# Patient Record
Sex: Female | Born: 1945 | Race: Black or African American | Hispanic: No | Smoking: Never smoker
Health system: Southern US, Community
[De-identification: ages and names within clinical notes are randomized; demographics above are authoritative.]

## PROBLEM LIST (undated history)

## (undated) DIAGNOSIS — I10 Essential (primary) hypertension: Secondary | ICD-10-CM

## (undated) HISTORY — PX: BREAST EXCISIONAL BIOPSY: SUR124

---

## 2000-02-19 ENCOUNTER — Encounter: Admission: RE | Admit: 2000-02-19 | Discharge: 2000-02-19 | Payer: Self-pay | Admitting: *Deleted

## 2000-02-19 ENCOUNTER — Encounter: Payer: Self-pay | Admitting: *Deleted

## 2000-02-26 ENCOUNTER — Encounter: Payer: Self-pay | Admitting: *Deleted

## 2000-02-26 ENCOUNTER — Encounter: Admission: RE | Admit: 2000-02-26 | Discharge: 2000-02-26 | Payer: Self-pay | Admitting: *Deleted

## 2001-02-28 ENCOUNTER — Encounter: Admission: RE | Admit: 2001-02-28 | Discharge: 2001-02-28 | Payer: Self-pay | Admitting: Obstetrics and Gynecology

## 2001-02-28 ENCOUNTER — Encounter: Payer: Self-pay | Admitting: Obstetrics and Gynecology

## 2001-03-02 ENCOUNTER — Encounter: Payer: Self-pay | Admitting: Pulmonary Disease

## 2001-03-02 ENCOUNTER — Encounter: Admission: RE | Admit: 2001-03-02 | Discharge: 2001-03-02 | Payer: Self-pay | Admitting: Pulmonary Disease

## 2001-03-24 ENCOUNTER — Encounter: Payer: Self-pay | Admitting: Otolaryngology

## 2001-03-24 ENCOUNTER — Encounter: Admission: RE | Admit: 2001-03-24 | Discharge: 2001-03-24 | Payer: Self-pay | Admitting: Otolaryngology

## 2001-07-21 ENCOUNTER — Encounter: Admission: RE | Admit: 2001-07-21 | Discharge: 2001-07-21 | Payer: Self-pay | Admitting: Otolaryngology

## 2001-07-21 ENCOUNTER — Encounter: Payer: Self-pay | Admitting: Otolaryngology

## 2001-08-24 ENCOUNTER — Encounter: Payer: Self-pay | Admitting: Otolaryngology

## 2001-08-24 ENCOUNTER — Encounter: Admission: RE | Admit: 2001-08-24 | Discharge: 2001-08-24 | Payer: Self-pay | Admitting: Otolaryngology

## 2002-02-09 ENCOUNTER — Other Ambulatory Visit: Admission: RE | Admit: 2002-02-09 | Discharge: 2002-02-09 | Payer: Self-pay | Admitting: Obstetrics and Gynecology

## 2002-03-06 ENCOUNTER — Encounter: Admission: RE | Admit: 2002-03-06 | Discharge: 2002-03-06 | Payer: Self-pay | Admitting: Obstetrics and Gynecology

## 2002-03-06 ENCOUNTER — Encounter: Payer: Self-pay | Admitting: Obstetrics and Gynecology

## 2002-03-16 ENCOUNTER — Encounter: Payer: Self-pay | Admitting: Obstetrics and Gynecology

## 2002-03-16 ENCOUNTER — Encounter: Admission: RE | Admit: 2002-03-16 | Discharge: 2002-03-16 | Payer: Self-pay | Admitting: Obstetrics and Gynecology

## 2002-03-16 ENCOUNTER — Other Ambulatory Visit: Admission: RE | Admit: 2002-03-16 | Discharge: 2002-03-16 | Payer: Self-pay | Admitting: Obstetrics and Gynecology

## 2003-05-16 ENCOUNTER — Encounter: Admission: RE | Admit: 2003-05-16 | Discharge: 2003-05-16 | Payer: Self-pay | Admitting: Obstetrics and Gynecology

## 2003-05-27 ENCOUNTER — Other Ambulatory Visit: Admission: RE | Admit: 2003-05-27 | Discharge: 2003-05-27 | Payer: Self-pay | Admitting: Obstetrics and Gynecology

## 2004-02-26 ENCOUNTER — Encounter: Admission: RE | Admit: 2004-02-26 | Discharge: 2004-02-26 | Payer: Self-pay | Admitting: Pulmonary Disease

## 2004-05-19 ENCOUNTER — Encounter: Admission: RE | Admit: 2004-05-19 | Discharge: 2004-05-19 | Payer: Self-pay | Admitting: Pulmonary Disease

## 2004-06-23 ENCOUNTER — Other Ambulatory Visit: Admission: RE | Admit: 2004-06-23 | Discharge: 2004-06-23 | Payer: Self-pay | Admitting: Obstetrics and Gynecology

## 2005-08-17 ENCOUNTER — Encounter: Admission: RE | Admit: 2005-08-17 | Discharge: 2005-08-17 | Payer: Self-pay | Admitting: Pulmonary Disease

## 2007-01-10 ENCOUNTER — Encounter: Admission: RE | Admit: 2007-01-10 | Discharge: 2007-01-31 | Payer: Self-pay | Admitting: Pulmonary Disease

## 2007-01-11 ENCOUNTER — Ambulatory Visit (HOSPITAL_COMMUNITY): Admission: RE | Admit: 2007-01-11 | Discharge: 2007-01-11 | Payer: Self-pay | Admitting: Pulmonary Disease

## 2007-07-21 ENCOUNTER — Emergency Department (HOSPITAL_COMMUNITY): Admission: EM | Admit: 2007-07-21 | Discharge: 2007-07-21 | Payer: Self-pay | Admitting: Emergency Medicine

## 2007-08-08 ENCOUNTER — Ambulatory Visit (HOSPITAL_COMMUNITY): Admission: RE | Admit: 2007-08-08 | Discharge: 2007-08-08 | Payer: Self-pay | Admitting: Pulmonary Disease

## 2008-01-12 ENCOUNTER — Encounter: Admission: RE | Admit: 2008-01-12 | Discharge: 2008-01-12 | Payer: Self-pay | Admitting: Pulmonary Disease

## 2008-02-22 ENCOUNTER — Encounter: Admission: RE | Admit: 2008-02-22 | Discharge: 2008-02-22 | Payer: Self-pay | Admitting: Orthopaedic Surgery

## 2008-03-12 ENCOUNTER — Ambulatory Visit (HOSPITAL_BASED_OUTPATIENT_CLINIC_OR_DEPARTMENT_OTHER): Admission: RE | Admit: 2008-03-12 | Discharge: 2008-03-12 | Payer: Self-pay | Admitting: Orthopaedic Surgery

## 2008-07-26 ENCOUNTER — Encounter: Admission: RE | Admit: 2008-07-26 | Discharge: 2008-07-26 | Payer: Self-pay | Admitting: Pulmonary Disease

## 2009-06-17 ENCOUNTER — Encounter: Admission: RE | Admit: 2009-06-17 | Discharge: 2009-06-17 | Payer: Self-pay | Admitting: Pulmonary Disease

## 2010-06-18 ENCOUNTER — Other Ambulatory Visit: Payer: Self-pay | Admitting: Pulmonary Disease

## 2010-06-18 DIAGNOSIS — Z1239 Encounter for other screening for malignant neoplasm of breast: Secondary | ICD-10-CM

## 2010-06-23 ENCOUNTER — Ambulatory Visit
Admission: RE | Admit: 2010-06-23 | Discharge: 2010-06-23 | Disposition: A | Discharge status: Home / Self Care | Payer: BC Managed Care – PPO | Source: Ambulatory Visit | Attending: Pulmonary Disease | Admitting: Pulmonary Disease

## 2010-06-23 DIAGNOSIS — Z1239 Encounter for other screening for malignant neoplasm of breast: Secondary | ICD-10-CM

## 2010-09-29 NOTE — Op Note (Signed)
[name], [name] NO.:  [id number]   MEDICAL RECORD NO.:  [id number]          PATIENT TYPE:  AMB   LOCATION:  DSC                          FACILITY:  [facility]   PHYSICIAN:  Vanita Panda. Magnus Ivan, M.D.DATE OF BIRTH:  1945-09-08   DATE OF PROCEDURE:  03/12/2008  DATE OF DISCHARGE:                               OPERATIVE REPORT   PREOPERATIVE DIAGNOSIS:  Right knee internal derangement with medial  meniscal tear.   POSTOPERATIVE DIAGNOSIS:  Right knee complex posterior horn medial  meniscal tear.   PROCEDURE:  Right knee arthroscopy with debridement and partial medial  meniscectomy.   SURGEON:  Vanita Panda. Magnus Ivan, MD   ANESTHESIA:  General.   BLOOD LOSS:  Minimal.   TOURNIQUET TIME:  None.   INDICATIONS:  Briefly, [name] is a 65 year old female with right  knee pain.  This is mainly over the medial joint line.  An MRI was  obtained that showed a complex posterior horn medial meniscal tear.  She  is not having related locking and catching, but mainly just pain.  We  tried to treat this with anti-inflammatories and injection in her knee.  After worsening problems with inability, I recommended she undergo  arthroscopy with debridement of partial meniscectomy.  The risks and  benefits of this were explained to her at length and she agreed proceed  with surgery.   DESCRIPTION OF PROCEDURE:  After informed consent was obtained,  appropriate right leg was marked, brought to the operating room, and  placed supine on the operating table.  General anesthesia was obtained.  A nonsterile tourniquet was placed around her upper right thigh, but was  not utilized during the case.  Her leg was then prepped and draped with  DuraPrep and sterile drapes including a sterile stockinette with the bed  raised and the lateral leg post was utilized and the leg was flexed off  the side of the table.  A time-out was called to identify the correct  patient and correct  right knee.  I then made an anterior lateral  arthroscopy portal with a knife.  I placed arthroscope and there was a  large effusion encountered in the knee.  I went right away to the medial  compartment and you could see that there was a complex posterior horn  medial meniscal tear.  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  I then made an anterior medial portal at the  same level and the arthroscopic shaver was placed.  I then performed a  partial medial meniscectomy.  I then used up-cutting biters as well to  smooth the meniscus out to smooth the margin.  I then did a minimal  debridement and chondroplasty of the medial compartment.  Next, the  intercondylar area with assessed, the ACL and PCL were intact, then with  the knee in the figure four position, I went to the lateral compartment.  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  Finally with  the knee extended, I assessed the trochlear groove and the patella.  There was no areas of loose cartilage, but you could see there was  chondromalacia of the trochlear groove and the patella as well.  All  instrumentation was then removed before I allowed fluid to lavage the  knee.  I then drained effusion from the knee.  The portal site was  closed interrupted 4-0 nylon suture.  I then infiltrated the knee in  the portal sites of morphine, 0.25% plain Marcaine mixture.  Sterile  dressings were applied including Ace wrap and ice pack.  The patient was  awakened, extubated, and taken to recovery room in stable condition.  All final counts were correct and there were no complications noted.      Vanita Panda. Magnus Ivan,  M.D.  Electronically Signed     CYB/MEDQ  D:  03/12/2008  T:  03/13/2008  Job:  [id number]

## 2011-02-16 LAB — POCT HEMOGLOBIN-HEMACUE: Hemoglobin: 13.6

## 2011-02-16 LAB — BASIC METABOLIC PANEL
CO2: 30
Calcium: 8.6
Chloride: 103
Creatinine, Ser: 0.81
GFR calc non Af Amer: >60
Sodium: 138

## 2011-07-16 ENCOUNTER — Other Ambulatory Visit: Payer: Self-pay | Admitting: Pulmonary Disease

## 2011-07-16 DIAGNOSIS — Z1231 Encounter for screening mammogram for malignant neoplasm of breast: Secondary | ICD-10-CM

## 2011-07-19 ENCOUNTER — Other Ambulatory Visit (HOSPITAL_COMMUNITY): Payer: Self-pay | Admitting: Orthopaedic Surgery

## 2011-08-13 ENCOUNTER — Ambulatory Visit
Admission: RE | Admit: 2011-08-13 | Discharge: 2011-08-13 | Disposition: A | Discharge status: Home / Self Care | Payer: Medicare Other | Source: Ambulatory Visit | Attending: Pulmonary Disease | Admitting: Pulmonary Disease

## 2011-08-13 DIAGNOSIS — Z1231 Encounter for screening mammogram for malignant neoplasm of breast: Secondary | ICD-10-CM

## 2011-10-08 ENCOUNTER — Ambulatory Visit (HOSPITAL_COMMUNITY): Admission: RE | Admit: 2011-10-08 | Payer: Medicare Other | Source: Ambulatory Visit | Admitting: Orthopaedic Surgery

## 2011-10-08 ENCOUNTER — Encounter (HOSPITAL_COMMUNITY): Admission: RE | Payer: Self-pay | Source: Ambulatory Visit

## 2011-10-08 SURGERY — ARTHROPLASTY, KNEE, TOTAL, USING IMAGELESS COMPUTER-ASSISTED NAVIGATION
Anesthesia: General | Site: Knee | Laterality: Left

## 2012-04-25 DIAGNOSIS — M179 Osteoarthritis of knee, unspecified: Secondary | ICD-10-CM | POA: Insufficient documentation

## 2012-05-31 DIAGNOSIS — I10 Essential (primary) hypertension: Secondary | ICD-10-CM

## 2012-05-31 DIAGNOSIS — K219 Gastro-esophageal reflux disease without esophagitis: Secondary | ICD-10-CM

## 2012-06-07 HISTORY — PX: MEDIAL PARTIAL KNEE REPLACEMENT: SHX5965

## 2012-06-12 ENCOUNTER — Ambulatory Visit: Payer: Medicare Other | Attending: Orthopedic Surgery

## 2012-06-12 DIAGNOSIS — IMO0001 Reserved for inherently not codable concepts without codable children: Secondary | ICD-10-CM | POA: Insufficient documentation

## 2012-06-12 DIAGNOSIS — R262 Difficulty in walking, not elsewhere classified: Secondary | ICD-10-CM | POA: Insufficient documentation

## 2012-06-12 DIAGNOSIS — M2560 Stiffness of unspecified joint, not elsewhere classified: Secondary | ICD-10-CM | POA: Insufficient documentation

## 2012-06-12 DIAGNOSIS — M2550 Pain in unspecified joint: Secondary | ICD-10-CM | POA: Insufficient documentation

## 2012-06-12 DIAGNOSIS — M6281 Muscle weakness (generalized): Secondary | ICD-10-CM | POA: Insufficient documentation

## 2012-06-19 ENCOUNTER — Ambulatory Visit: Payer: Medicare Other | Attending: Orthopedic Surgery

## 2012-06-19 DIAGNOSIS — M6281 Muscle weakness (generalized): Secondary | ICD-10-CM | POA: Insufficient documentation

## 2012-06-19 DIAGNOSIS — IMO0001 Reserved for inherently not codable concepts without codable children: Secondary | ICD-10-CM | POA: Insufficient documentation

## 2012-06-19 DIAGNOSIS — M2560 Stiffness of unspecified joint, not elsewhere classified: Secondary | ICD-10-CM | POA: Insufficient documentation

## 2012-06-19 DIAGNOSIS — R262 Difficulty in walking, not elsewhere classified: Secondary | ICD-10-CM | POA: Insufficient documentation

## 2012-06-19 DIAGNOSIS — M2550 Pain in unspecified joint: Secondary | ICD-10-CM | POA: Insufficient documentation

## 2012-06-22 ENCOUNTER — Ambulatory Visit: Payer: Medicare Other

## 2012-06-26 ENCOUNTER — Ambulatory Visit: Payer: Medicare Other

## 2012-06-29 ENCOUNTER — Ambulatory Visit: Payer: Medicare Other

## 2012-07-11 ENCOUNTER — Ambulatory Visit: Payer: Medicare Other

## 2012-07-18 ENCOUNTER — Ambulatory Visit: Payer: Medicare Other

## 2012-07-20 ENCOUNTER — Ambulatory Visit: Payer: Medicare Other | Attending: Orthopedic Surgery | Admitting: Physical Therapy

## 2012-07-20 DIAGNOSIS — R262 Difficulty in walking, not elsewhere classified: Secondary | ICD-10-CM | POA: Insufficient documentation

## 2012-07-20 DIAGNOSIS — M2550 Pain in unspecified joint: Secondary | ICD-10-CM | POA: Insufficient documentation

## 2012-07-20 DIAGNOSIS — M6281 Muscle weakness (generalized): Secondary | ICD-10-CM | POA: Insufficient documentation

## 2012-07-20 DIAGNOSIS — M2560 Stiffness of unspecified joint, not elsewhere classified: Secondary | ICD-10-CM | POA: Insufficient documentation

## 2012-07-20 DIAGNOSIS — IMO0001 Reserved for inherently not codable concepts without codable children: Secondary | ICD-10-CM | POA: Insufficient documentation

## 2012-07-21 ENCOUNTER — Ambulatory Visit: Payer: Medicare Other

## 2012-07-26 ENCOUNTER — Ambulatory Visit: Payer: Medicare Other

## 2012-07-28 ENCOUNTER — Ambulatory Visit: Payer: Medicare Other

## 2012-11-28 DIAGNOSIS — Z96651 Presence of right artificial knee joint: Secondary | ICD-10-CM | POA: Insufficient documentation

## 2012-11-28 DIAGNOSIS — Z96652 Presence of left artificial knee joint: Secondary | ICD-10-CM | POA: Insufficient documentation

## 2013-02-28 ENCOUNTER — Ambulatory Visit (INDEPENDENT_AMBULATORY_CARE_PROVIDER_SITE_OTHER): Payer: Medicare Other | Admitting: Podiatry

## 2013-02-28 ENCOUNTER — Encounter: Payer: Self-pay | Admitting: Podiatry

## 2013-02-28 VITALS — BP 118/70 | HR 71 | Ht 64.0 in | Wt 195.0 lb

## 2013-02-28 DIAGNOSIS — B351 Tinea unguium: Secondary | ICD-10-CM | POA: Insufficient documentation

## 2013-02-28 DIAGNOSIS — M79673 Pain in unspecified foot: Secondary | ICD-10-CM | POA: Insufficient documentation

## 2013-02-28 DIAGNOSIS — M79609 Pain in unspecified limb: Secondary | ICD-10-CM

## 2013-02-28 NOTE — Patient Instructions (Signed)
Seen for painful nails. All nails debrided.

## 2013-02-28 NOTE — Progress Notes (Signed)
67 year old presents requesting both great toe nails trimmed. They are very thick and hurts to wear any type of closed in shoes.  Objective: Neurovascular status are within normal. No other dermatologic issues other than the nail. Thick deformed nail 1 bil. Left foot bunion.  Assessment: Painful nail both great toes.  Plan: All nails debrided.

## 2013-03-05 ENCOUNTER — Other Ambulatory Visit: Payer: Self-pay | Admitting: Pulmonary Disease

## 2013-03-05 DIAGNOSIS — Z1231 Encounter for screening mammogram for malignant neoplasm of breast: Secondary | ICD-10-CM

## 2013-03-24 ENCOUNTER — Emergency Department (INDEPENDENT_AMBULATORY_CARE_PROVIDER_SITE_OTHER)
Admission: EM | Admit: 2013-03-24 | Discharge: 2013-03-24 | Disposition: A | Discharge status: Home / Self Care | Payer: PRIVATE HEALTH INSURANCE | Source: Home / Self Care

## 2013-03-24 ENCOUNTER — Encounter (HOSPITAL_COMMUNITY): Payer: Self-pay | Admitting: Emergency Medicine

## 2013-03-24 DIAGNOSIS — R252 Cramp and spasm: Secondary | ICD-10-CM

## 2013-03-24 DIAGNOSIS — R259 Unspecified abnormal involuntary movements: Secondary | ICD-10-CM

## 2013-03-24 DIAGNOSIS — M2550 Pain in unspecified joint: Secondary | ICD-10-CM

## 2013-03-24 HISTORY — DX: Essential (primary) hypertension: I10

## 2013-03-24 MED ORDER — IBUPROFEN 600 MG PO TABS: 600.0000 mg | ORAL_TABLET | Freq: Four times a day (QID) | ORAL | Status: DC | PRN

## 2013-03-24 MED ORDER — CYCLOBENZAPRINE HCL 5 MG PO TABS: 5.0000 mg | ORAL_TABLET | Freq: Three times a day (TID) | ORAL | Status: DC | PRN

## 2013-03-24 MED ORDER — TRAMADOL HCL 50 MG PO TABS: 50.0000 mg | ORAL_TABLET | Freq: Four times a day (QID) | ORAL | Status: DC | PRN
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
## 2013-03-25 NOTE — ED Provider Notes (Signed)
Medical screening examination/treatment/procedure(s) were performed by resident physician or non-physician practitioner and as supervising physician I was immediately available for consultation/collaboration.   Barkley Bruns MD.   Linna Hoff, MD 03/25/13 [phone]

## 2013-04-19 ENCOUNTER — Ambulatory Visit
Admission: RE | Admit: 2013-04-19 | Discharge: 2013-04-19 | Disposition: A | Discharge status: Home / Self Care | Payer: Medicare Other | Source: Ambulatory Visit | Attending: Pulmonary Disease | Admitting: Pulmonary Disease

## 2013-04-19 DIAGNOSIS — Z1231 Encounter for screening mammogram for malignant neoplasm of breast: Secondary | ICD-10-CM

## 2013-11-20 ENCOUNTER — Encounter: Payer: Self-pay | Admitting: Podiatry

## 2013-11-20 ENCOUNTER — Ambulatory Visit (INDEPENDENT_AMBULATORY_CARE_PROVIDER_SITE_OTHER): Payer: Medicare Other | Admitting: Podiatry

## 2013-11-20 VITALS — BP 116/69 | HR 92 | Ht 64.0 in | Wt 190.0 lb

## 2013-11-20 DIAGNOSIS — M79672 Pain in left foot: Secondary | ICD-10-CM

## 2013-11-20 DIAGNOSIS — L608 Other nail disorders: Secondary | ICD-10-CM

## 2013-11-20 DIAGNOSIS — B351 Tinea unguium: Secondary | ICD-10-CM

## 2013-11-20 DIAGNOSIS — L609 Nail disorder, unspecified: Secondary | ICD-10-CM

## 2013-11-20 DIAGNOSIS — M79609 Pain in unspecified limb: Secondary | ICD-10-CM

## 2013-11-20 NOTE — Progress Notes (Signed)
Subjective: 68 year old presents requesting both great toe nails trimmed. They got thick and deformed since nail surgery was done.  Nails are very thick and hurts to wear any type of closed in shoes.   Objective:  Neurovascular status are within normal.  No other dermatologic issues other than the nail.  Thick deformed nail 1 bil.  Left foot bunion.   Assessment: Painful nail both great toes.   Plan: Both big toe nails debrided and grinded. Advised to use Salsun blue to wash and scrub.  Return in 3 months.

## 2013-11-20 NOTE — Patient Instructions (Signed)
Seen for hypertrophic nails. All nails debrided. Return in 3 months or as needed.  

## 2014-02-20 ENCOUNTER — Ambulatory Visit (INDEPENDENT_AMBULATORY_CARE_PROVIDER_SITE_OTHER): Payer: Medicare Other | Admitting: Podiatry

## 2014-02-20 ENCOUNTER — Encounter: Payer: Self-pay | Admitting: Podiatry

## 2014-02-20 VITALS — BP 120/72 | HR 85 | Ht 64.0 in | Wt 194.0 lb

## 2014-02-20 DIAGNOSIS — M79673 Pain in unspecified foot: Secondary | ICD-10-CM | POA: Diagnosis not present

## 2014-02-20 DIAGNOSIS — B351 Tinea unguium: Secondary | ICD-10-CM

## 2014-02-20 DIAGNOSIS — L608 Other nail disorders: Secondary | ICD-10-CM

## 2014-02-20 NOTE — Patient Instructions (Signed)
Seen for hypertrophic nails. All nails debrided. Return in 3 months or as needed.  

## 2014-02-20 NOTE — Progress Notes (Signed)
Subjective:  68 year old presents requesting both great toe nails trimmed. They are thick and deformed.  She was not able to find Salsun blue.  Objective:  Neurovascular status are within normal.  No other dermatologic issues other than the nail.  Thick deformed nail 1 bil.  Left foot bunion asymptomatic.  Assessment: Painful deformed nail both great toes.  Mycotic nails.  Plan: Both big toe nails debrided and grinded.  Advised to use any dandruff shampoo to scrub toe nails.  Return in 3 months.

## 2014-05-24 ENCOUNTER — Ambulatory Visit: Payer: Medicare Other | Admitting: Podiatry

## 2014-06-18 ENCOUNTER — Ambulatory Visit: Payer: PRIVATE HEALTH INSURANCE | Admitting: Podiatry

## 2014-08-21 ENCOUNTER — Encounter: Payer: Self-pay | Admitting: Podiatry

## 2014-08-21 ENCOUNTER — Ambulatory Visit (INDEPENDENT_AMBULATORY_CARE_PROVIDER_SITE_OTHER): Payer: PPO | Admitting: Podiatry

## 2014-08-21 VITALS — BP 136/72 | HR 87 | Ht 64.0 in | Wt 193.0 lb

## 2014-08-21 DIAGNOSIS — L608 Other nail disorders: Secondary | ICD-10-CM

## 2014-08-21 DIAGNOSIS — M21612 Bunion of left foot: Secondary | ICD-10-CM

## 2014-08-21 DIAGNOSIS — M2012 Hallux valgus (acquired), left foot: Secondary | ICD-10-CM | POA: Diagnosis not present

## 2014-08-21 DIAGNOSIS — M21619 Bunion of unspecified foot: Secondary | ICD-10-CM | POA: Insufficient documentation

## 2014-08-21 NOTE — Patient Instructions (Signed)
Seen for thick great toe nails. Noted of enlarged bunion on left. May benefit from bunionectomy on left. Both great toe nails grinded. Return as needed.

## 2014-08-21 NOTE — Progress Notes (Signed)
Subjective:  69 year old presents with concern on left big to callus and request to have both great toe nails trimmed. They are thick and deformed.   Objective:  Neurovascular status are within normal.  No other dermatologic issues other than the nail.  Thick deformed nail 1 bil.  Pinch callus left hallux.  Left foot bunion asymptomatic.  Assessment: Painful deformed nail both great toes.  Mycotic nails.  Plan: Both big toe nails grinded.  Reviewed surgical option on left foot bunion. Return in 3-4 months or as needed.

## 2014-12-18 ENCOUNTER — Encounter: Payer: Self-pay | Admitting: Podiatry

## 2014-12-18 ENCOUNTER — Ambulatory Visit (INDEPENDENT_AMBULATORY_CARE_PROVIDER_SITE_OTHER): Payer: PPO | Admitting: Podiatry

## 2014-12-18 VITALS — BP 123/69 | HR 72

## 2014-12-18 DIAGNOSIS — B351 Tinea unguium: Secondary | ICD-10-CM | POA: Diagnosis not present

## 2014-12-18 DIAGNOSIS — M2012 Hallux valgus (acquired), left foot: Secondary | ICD-10-CM

## 2014-12-18 DIAGNOSIS — M79673 Pain in unspecified foot: Secondary | ICD-10-CM

## 2014-12-18 DIAGNOSIS — M21612 Bunion of left foot: Secondary | ICD-10-CM

## 2014-12-18 NOTE — Patient Instructions (Signed)
Debrided both great toe nails and filed. Return in 3 months or as needed.

## 2014-12-18 NOTE — Progress Notes (Signed)
Subjective:  69 year old female presents requesting both great toe nails trimmed. They are thick and deformed.   Objective:  Neurovascular status are within normal.  No other dermatologic issues other than the nail.  Thick deformed nail 1 bil.  Pinch callus left hallux.  Left foot bunion asymptomatic.  Assessment: Painful deformed nail both great toes.  Mycotic nails.  Plan: Both big toe nails grinded.  Reviewed surgical option on left foot bunion. Return in 3-4 months or as needed

## 2015-03-20 ENCOUNTER — Ambulatory Visit (INDEPENDENT_AMBULATORY_CARE_PROVIDER_SITE_OTHER): Payer: PPO | Admitting: Podiatry

## 2015-03-20 ENCOUNTER — Encounter: Payer: Self-pay | Admitting: Podiatry

## 2015-03-20 VITALS — BP 132/75 | HR 85

## 2015-03-20 DIAGNOSIS — B351 Tinea unguium: Secondary | ICD-10-CM

## 2015-03-20 DIAGNOSIS — M79673 Pain in unspecified foot: Secondary | ICD-10-CM | POA: Diagnosis not present

## 2015-03-20 DIAGNOSIS — L608 Other nail disorders: Secondary | ICD-10-CM

## 2015-03-20 NOTE — Progress Notes (Signed)
Subjective:  69 year old female presents requesting both great toe nails grinded rather than trimmed. Patient wants to try to grow tham over the distal hump of the toe.   Objective:  Neurovascular status are within normal.  No other dermatologic issues other than the nail.  Thick deformed nail 1 bil.  Pinch callus left hallux.  Left foot bunion asymptomatic.  Assessment: Painful deformed nail both great toes.  Mycotic nails both halluces.  Plan: Both thick nails grinded down to thin on both great toe.  Reviewed possible more fungal infection under the nail plate when they were not debrided short. Patient still did not want them to cut out.  Return in 3-4 months or as needed

## 2015-03-20 NOTE — Patient Instructions (Signed)
Grinded both great toe nail. Return in 3 months or as needed.

## 2015-03-21 ENCOUNTER — Ambulatory Visit: Payer: PRIVATE HEALTH INSURANCE | Admitting: Podiatry

## 2015-05-18 ENCOUNTER — Encounter (HOSPITAL_COMMUNITY): Payer: Self-pay | Admitting: Emergency Medicine

## 2015-05-18 ENCOUNTER — Emergency Department (INDEPENDENT_AMBULATORY_CARE_PROVIDER_SITE_OTHER)
Admission: EM | Admit: 2015-05-18 | Discharge: 2015-05-18 | Disposition: A | Discharge status: Home / Self Care | Payer: Medicare Other | Source: Home / Self Care | Attending: Internal Medicine | Admitting: Internal Medicine

## 2015-05-18 DIAGNOSIS — J019 Acute sinusitis, unspecified: Secondary | ICD-10-CM | POA: Diagnosis not present

## 2015-05-18 LAB — POCT RAPID STREP A: STREPTOCOCCUS, GROUP A SCREEN (DIRECT): NEGATIVE

## 2015-05-18 MED ORDER — PREDNISONE 50 MG PO TABS: 50.0000 mg | ORAL_TABLET | Freq: Every day | ORAL | Status: AC

## 2015-05-18 MED ORDER — AZITHROMYCIN 250 MG PO TABS: ORAL_TABLET | ORAL | Status: DC

## 2015-05-18 NOTE — Discharge Instructions (Signed)
Prescriptions for [facility] (antibiotic) [facility] [address]. Recheck or followup with Dr Petra KubaKilpatrick in a few days if not starting to improve, for new fever >100.5, or increasing phlegm.

## 2015-05-18 NOTE — ED Notes (Signed)
Patient reports onset of symptoms last Monday 12/26.  Reports night sweats, sore throat, ears hurting, eyes hurting.  Reports nosebleed last night.  Denies nausea, denies vomiting.  Patient reports poor appetite. Patient reports having a near syncopal episode on Tuesday or Wednesday.  Denies any injury just overwhelming weakness

## 2015-05-18 NOTE — ED Provider Notes (Addendum)
CSN: [id number]     Arrival date & time 05/18/15  1304 History   First MD Initiated Contact with Patient 05/18/15 1427     Chief Complaint  Patient presents with  . URI   HPI  70 year old lady who presents with a one-week history of headache, sinus congestion, runny nose, sinus drainage. Sore throat. Productive cough. Nausea but no vomiting. No appetite. No diarrhea. Achiness, which she predominantly locates in the head. Has felt unsteady on her feet. Had a flu shot this season.  Past Medical History  Diagnosis Date  . Hypertension    Past Surgical History  Procedure Laterality Date  . 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 Smoking status: Never Smoker   . Smokeless tobacco: Never Used  . Alcohol Use: No    Review of Systems  All other systems reviewed and are negative.   Allergies  Codeine and Tetracyclines & related  Home Medications   Prior to Admission medications   Medication Sig Start Date End Date Taking? Authorizing Provider         cyclobenzaprine (FLEXERIL) 5 MG tablet Take 1 tablet (5 mg total) by mouth 3 (three) times daily as needed for muscle spasms. 03/24/13   Ozella Rocks, MD  Diethylpropion HCl CR 75 MG TB24 Take 1 tablet by mouth daily. 07/17/14   Historical Provider, MD  ibuprofen (ADVIL,MOTRIN) 600 MG tablet Take 1 tablet (600 mg total) by mouth every 6 (six) hours as needed. 03/24/13   Ozella Rocks, MD  losartan-hydrochlorothiazide (HYZAAR) 50-12.5 MG per tablet Take 1 tablet by mouth daily.    Historical Provider, MD  omeprazole (PRILOSEC) 40 MG capsule  08/01/14   Historical Provider, MD         ranitidine (ZANTAC) 150 MG capsule Take 150 mg by mouth 2 (two) times daily.    Historical Provider, MD  rosuvastatin (CRESTOR) 5 MG tablet Take 5 mg by mouth daily.    Historical Provider, MD  traMADol (ULTRAM) 50 MG tablet Take 1 tablet (50 mg total) by mouth every 6 (six) hours as needed. 03/24/13    Ozella Rocks, MD    BP 130/84 mmHg  Pulse 115  Temp(Src) 99.9 F (37.7 C) (Oral)  Resp 22  SpO2 96% Orthostatic VS for the past 24 hrs:  BP- Lying Pulse- Lying BP- Sitting Pulse- Sitting BP- Standing at 0 minutes Pulse- Standing at 0 minutes  05/18/15 1445 170/75 mmHg 103 116/77 mmHg 101 121/82 mmHg 109    Physical Exam  Constitutional: She is oriented to person, place, and time.  Alert, nicely groomed Sitting in the bedside chair, looks ill but not toxic  HENT:  Head: Atraumatic.  Bilateral TMs are moderately dull, red tinged Marked nasal congestion with crusting, mucopurulent material present bilaterally, left nares with crusted blood Mouth is a little bit dry, throat is a little bit red  Eyes:  Conjugate gaze, no eye redness/drainage  Neck: Neck supple.  Cardiovascular: Regular rhythm.   Heart rate 110s on exam  Pulmonary/Chest: No respiratory distress. She has no wheezes. She has no rales.  Coarse but symmetric breath sounds throughout  Abdominal: She exhibits no distension.  Musculoskeletal: Normal range of motion. She exhibits no edema.  No leg swelling  Neurological: She is alert and oriented to person, place, and time.  Skin: Skin is warm and dry.  No cyanosis  Nursing note and vitals reviewed.   ED Course  Procedures (including critical care time)  Labs Review  Results for orders placed or performed during the hospital encounter of 05/18/15  POCT rapid strep A [facility]  Result Value Ref Range   Streptococcus, Group A Screen (Direct) NEGATIVE NEGATIVE      MDM   1. Acute sinusitis, unspecified    Discharge Medication List as of 05/18/2015  3:06 PM    START taking these medications   Details  azithromycin (ZITHROMAX) 250 MG tablet 2 tabs today then 1 tablet daily by mouth for 4 more days, until gone., Normal    predniSONE (DELTASONE) 50 MG tablet Take 1 tablet (50 mg total) by mouth daily with breakfast., Starting 05/18/2015, Until Wed  05/21/15, Normal       Push fluids. Recheck or FU pcp/George Kilpatrick for new fever >100.5, increasing phlegm, or if not starting to improve in a few days. Anticipate gradual improvement in cough/congestion over the next 2-3 weeks.    Eustace MooreLaura W Dorlene Footman, MD 05/19/15 1103  Eustace MooreLaura W Calianna Kim, MD 05/19/15 [phone]

## 2015-05-20 LAB — CULTURE, GROUP A STREP: Strep A Culture: NEGATIVE

## 2015-06-03 ENCOUNTER — Encounter: Payer: Self-pay | Admitting: Obstetrics

## 2015-06-03 ENCOUNTER — Ambulatory Visit (INDEPENDENT_AMBULATORY_CARE_PROVIDER_SITE_OTHER): Payer: Medicare Other | Admitting: Obstetrics

## 2015-06-03 VITALS — BP 106/72 | HR 83 | Wt 215.0 lb

## 2015-06-03 DIAGNOSIS — R399 Unspecified symptoms and signs involving the genitourinary system: Secondary | ICD-10-CM | POA: Diagnosis not present

## 2015-06-03 DIAGNOSIS — R1032 Left lower quadrant pain: Secondary | ICD-10-CM

## 2015-06-03 LAB — POCT URINALYSIS DIPSTICK
BILIRUBIN UA: NEGATIVE
Glucose, UA: NEGATIVE
Ketones, UA: NEGATIVE
Nitrite, UA: NEGATIVE
SPEC GRAV UA: 1.025
UROBILINOGEN UA: NEGATIVE
pH, UA: 5

## 2015-06-03 NOTE — Addendum Note (Signed)
Addended by: Marya Landry D on: 06/03/2015 10:25 AM   Modules accepted: Orders

## 2015-06-03 NOTE — Progress Notes (Signed)
Patient ID: [name], female   DOB: June 11, 1945, 70 y.o.   MRN: [id number]  Chief Complaint  Patient presents with  . Pelvic Pain    lower left side pain, resolving, some vaginal pain    HPI [name] is a 70 y.o. female.  Abdominal pain over past several weeks, resolving.  Occasional constipation.  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  Has also had some vulva pain. HPI  Past Medical History  Diagnosis Date  . Hypertension     Past Surgical History  Procedure Laterality Date  . 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 No pertinent family history.  Social History Social History  Substance Use Topics  . Smoking status: Never Smoker   . Smokeless tobacco: Never Used  . Alcohol Use: No    Allergies  Allergen Reactions  . Codeine Nausea And Vomiting  . Tetracyclines & Related Nausea And Vomiting  . Vibramycin [Doxycycline Calcium] Nausea And Vomiting    Current Outpatient Prescriptions  Medication Sig Dispense Refill  . aspirin EC 81 MG tablet Take 81 mg by mouth daily.    Marland Kitchen losartan-hydrochlorothiazide (HYZAAR) 50-12.5 MG per tablet Take 1 tablet by mouth daily.    Marland Kitchen omeprazole (PRILOSEC) 40 MG capsule   0  . rosuvastatin (CRESTOR) 5 MG tablet Take 5 mg by mouth daily.    Marland Kitchen azithromycin (ZITHROMAX) 250 MG tablet 2 tabs today then 1 tablet daily by mouth for 4 more days, until gone. (Patient not taking: Reported on 06/03/2015) 6 each 0  . cyclobenzaprine (FLEXERIL) 5 MG tablet Take 1 tablet (5 mg total) by mouth 3 (three) times daily as needed for muscle spasms. (Patient not taking: Reported on 06/03/2015) 30 tablet 0  . Diethylpropion HCl CR 75 MG TB24 Take 1 tablet by mouth daily. Reported on 06/03/2015  0  . ibuprofen (ADVIL,MOTRIN) 600 MG tablet Take 1 tablet (600 mg total) by mouth every 6 (six) hours as needed. (Patient not taking:  Reported on 06/03/2015) 30 tablet 0  . ranitidine (ZANTAC) 150 MG capsule Take 150 mg by mouth 2 (two) times daily. Reported on 06/03/2015    . traMADol (ULTRAM) 50 MG tablet Take 1 tablet (50 mg total) by mouth every 6 (six) hours as needed. (Patient not taking: Reported on 06/03/2015) 30 tablet 0   No current facility-administered medications for this visit.    Review of Systems Review of Systems Constitutional: negative for fatigue and weight loss Respiratory: negative for cough and wheezing Cardiovascular: negative for chest pain, fatigue and palpitations Gastrointestinal: positive for abdominal pain and negative for change in bowel habits Genitourinary:negative Integument/breast: negative for nipple discharge Musculoskeletal:negative for myalgias Neurological: negative for gait problems and tremors Behavioral/Psych: negative for abusive relationship, depression Endocrine: negative for temperature intolerance     Blood pressure 106/72, pulse 83, weight 215 lb (97.523 kg).  Physical Exam Physical Exam General:   alert  Skin:   no rash or abnormalities  Lungs:   clear to auscultation bilaterally  Heart:   regular rate and rhythm, S1, S2 normal, no murmur, click, rub or gallop  Breasts:   normal without suspicious masses, skin or nipple changes or axillary nodes  Abdomen:  normal findings: no organomegaly, soft, non-tender and no hernia  Pelvis:  External genitalia: normal general appearance Urinary system: urethral meatus normal and bladder without fullness, nontender Vaginal:  normal without tenderness, induration or masses Cervix: absent Adnexa: not felt Uterus: absent      Data Reviewed Urinalysis  Assessment     Abdominal / Pelvic pain.  Doubt gyn etiology.  Resolving spontaneously.     Plan    Will follow conservatively. F/U in 4 weeks.   Orders Placed This Encounter  Procedures  . Urine culture   Meds ordered this encounter  Medications  . aspirin EC 81  MG tablet    Sig: Take 81 mg by mouth daily.

## 2015-06-05 LAB — URINE CULTURE
COLONY COUNT: NO GROWTH
ORGANISM ID, BACTERIA: NO GROWTH

## 2015-06-19 ENCOUNTER — Encounter: Payer: Self-pay | Admitting: Podiatry

## 2015-06-19 ENCOUNTER — Ambulatory Visit (INDEPENDENT_AMBULATORY_CARE_PROVIDER_SITE_OTHER): Payer: Medicare Other | Admitting: Podiatry

## 2015-06-19 VITALS — BP 142/69 | HR 82

## 2015-06-19 DIAGNOSIS — M79673 Pain in unspecified foot: Secondary | ICD-10-CM

## 2015-06-19 DIAGNOSIS — L608 Other nail disorders: Secondary | ICD-10-CM

## 2015-06-19 DIAGNOSIS — B351 Tinea unguium: Secondary | ICD-10-CM

## 2015-06-19 NOTE — Patient Instructions (Signed)
Seen for hypertrophic nails. Both great toe nails grinded. Return in 3 months or as needed.

## 2015-06-19 NOTE — Progress Notes (Signed)
Subjective:  70 year old female presents requesting both great toe nails grinded rather than trimmed.  Patient wants to try to grow tham over the distal hump of the toe.   Objective:  Neurovascular status are within normal.  No other dermatologic issues other than the nail.  Thick deformed nail 1 bil.  Pinch callus left hallux.  Left foot bunion asymptomatic.  Assessment: Painful deformed nail both great toes.  Mycotic nails both halluces.  Plan: Both thick nails grinded down to thin on both great toe.

## 2015-07-01 ENCOUNTER — Telehealth: Payer: Self-pay

## 2015-07-01 ENCOUNTER — Ambulatory Visit (INDEPENDENT_AMBULATORY_CARE_PROVIDER_SITE_OTHER): Payer: PRIVATE HEALTH INSURANCE | Admitting: Obstetrics

## 2015-07-01 ENCOUNTER — Encounter: Payer: Self-pay | Admitting: Obstetrics

## 2015-07-01 VITALS — BP 125/78 | HR 95 | Temp 98.4°F | Wt 218.0 lb

## 2015-07-01 DIAGNOSIS — Z1212 Encounter for screening for malignant neoplasm of rectum: Secondary | ICD-10-CM

## 2015-07-01 DIAGNOSIS — R1032 Left lower quadrant pain: Secondary | ICD-10-CM

## 2015-07-01 DIAGNOSIS — Z1239 Encounter for other screening for malignant neoplasm of breast: Secondary | ICD-10-CM | POA: Diagnosis not present

## 2015-07-01 DIAGNOSIS — R399 Unspecified symptoms and signs involving the genitourinary system: Secondary | ICD-10-CM

## 2015-07-01 DIAGNOSIS — Z1211 Encounter for screening for malignant neoplasm of colon: Secondary | ICD-10-CM

## 2015-07-01 NOTE — Telephone Encounter (Signed)
LEFT MESSAGE FOR PATIENT TO CALL REGARDING [facility] AND [facility]

## 2015-07-01 NOTE — Progress Notes (Signed)
Patient ID: [name], female   DOB: 1945-09-21, 70 y.o.   MRN: [id number]  Chief Complaint  Patient presents with  . Follow-up    HPI [name] is a 70 y.o. female.  Presents for F/U visit.  H/O pelvic pain and UTI symptoms.  These symptoms have resolved.  HPI  Past Medical History  Diagnosis Date  . Hypertension     Past Surgical History  Procedure Laterality Date  . 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 No pertinent family history.  Social History Social History  Substance Use Topics  . Smoking status: Never Smoker   . Smokeless tobacco: Never Used  . Alcohol Use: No    Allergies  Allergen Reactions  . Codeine Nausea And Vomiting  . Tetracyclines & Related Nausea And Vomiting  . Vibramycin [Doxycycline Calcium] Nausea And Vomiting    Current Outpatient Prescriptions  Medication Sig Dispense Refill  . aspirin EC 81 MG tablet Take 81 mg by mouth daily.    Marland Kitchen azithromycin (ZITHROMAX) 250 MG tablet 2 tabs today then 1 tablet daily by mouth for 4 more days, until gone. 6 each 0  . cyclobenzaprine (FLEXERIL) 5 MG tablet Take 1 tablet (5 mg total) by mouth 3 (three) times daily as needed for muscle spasms. 30 tablet 0  . Diethylpropion HCl CR 75 MG TB24 Take 1 tablet by mouth daily. Reported on 06/03/2015  0  . ibuprofen (ADVIL,MOTRIN) 600 MG tablet Take 1 tablet (600 mg total) by mouth every 6 (six) hours as needed. 30 tablet 0  . losartan-hydrochlorothiazide (HYZAAR) 50-12.5 MG per tablet Take 1 tablet by mouth daily.    Marland Kitchen omeprazole (PRILOSEC) 40 MG capsule   0  . ranitidine (ZANTAC) 150 MG capsule Take 150 mg by mouth 2 (two) times daily. Reported on 06/03/2015    . rosuvastatin (CRESTOR) 5 MG tablet Take 5 mg by mouth daily.    . traMADol (ULTRAM) 50 MG tablet Take 1 tablet (50 mg total) by mouth every 6 (six) hours as needed. 30 tablet 0   No current facility-administered medications for this visit.    Review of  Systems Review of Systems Constitutional: negative for fatigue and weight loss Respiratory: negative for cough and wheezing Cardiovascular: negative for chest pain, fatigue and palpitations Gastrointestinal: negative for abdominal pain and change in bowel habits Genitourinary:negative Integument/breast: negative for nipple discharge Musculoskeletal:negative for myalgias Neurological: negative for gait problems and tremors Behavioral/Psych: negative for abusive relationship, depression Endocrine: negative for temperature intolerance     Blood pressure 125/78, pulse 95, temperature 98.4 F (36.9 C), weight 218 lb (98.884 kg).  Physical Exam Physical Exam: Deferred  100% of 15 min visit spent on counseling and coordination of care.   Data Reviewed Urine culture  Assessment     Pelvic pain.  Resolved UTI symptoms.  Resolved. Health maintenance.  Needs colonoscopy and mammogram this year.    Plan    Schedule routine mammogram and screening colonoscopy.  F/U 1 year.    Orders Placed This Encounter  Procedures  . MM Digital Screening    Standing Status: Future     Number of Occurrences:      Standing Expiration Date: 08/28/2016    Order Specific Question:  Reason for Exam (SYMPTOM  OR DIAGNOSIS REQUIRED)    Answer:  screening    Order Specific Question:  Preferred imaging location?    Answer:  [facility]  . Ambulatory referral to Gastroenterology  Referral Priority:  Routine    Referral Type:  Consultation    Referral Reason:  Specialty Services Required    Number of Visits Requested:  1   No orders of the defined types were placed in this encounter.

## 2015-07-15 ENCOUNTER — Ambulatory Visit: Payer: Self-pay

## 2015-09-18 ENCOUNTER — Encounter: Payer: Self-pay | Admitting: Podiatry

## 2015-09-18 ENCOUNTER — Ambulatory Visit (INDEPENDENT_AMBULATORY_CARE_PROVIDER_SITE_OTHER): Payer: Medicare Other | Admitting: Podiatry

## 2015-09-18 VITALS — BP 142/80 | HR 83

## 2015-09-18 DIAGNOSIS — L608 Other nail disorders: Secondary | ICD-10-CM

## 2015-09-18 DIAGNOSIS — M79673 Pain in unspecified foot: Secondary | ICD-10-CM

## 2015-09-18 DIAGNOSIS — B351 Tinea unguium: Secondary | ICD-10-CM

## 2015-09-18 NOTE — Patient Instructions (Signed)
Seen for hypertrophic nails. All nails debrided. Return in 3 months or as needed.  

## 2015-09-18 NOTE — Progress Notes (Signed)
Subjective:  69 year old female presents requesting both great toe nails grinded rather than trimmed. They hurt in closed in shoes.   Objective:  Neurovascular status are within normal.  No other dermatologic issues other than the nail.  Thick deformed nail 1 bil.  Pinch callus left hallux.  Left foot bunion asymptomatic.  Assessment: Painful deformed nail both great toes.  Mycotic nails both halluces.  Plan: Both thick nails grinded down to thin on both great toe 

## 2015-12-18 ENCOUNTER — Ambulatory Visit: Payer: Medicare Other | Admitting: Podiatry

## 2015-12-18 ENCOUNTER — Ambulatory Visit (INDEPENDENT_AMBULATORY_CARE_PROVIDER_SITE_OTHER): Payer: Medicare Other | Admitting: Podiatry

## 2015-12-18 ENCOUNTER — Encounter: Payer: Self-pay | Admitting: Podiatry

## 2015-12-18 VITALS — BP 141/73 | HR 95

## 2015-12-18 DIAGNOSIS — B351 Tinea unguium: Secondary | ICD-10-CM

## 2015-12-18 DIAGNOSIS — L608 Other nail disorders: Secondary | ICD-10-CM | POA: Diagnosis not present

## 2015-12-18 DIAGNOSIS — M79673 Pain in unspecified foot: Secondary | ICD-10-CM

## 2015-12-18 NOTE — Progress Notes (Signed)
Subjective:  70 year old female presents requesting both great toe nails grinded rather than trimmed. They hurt in closed in shoes.   Objective:  Neurovascular status are within normal.  No other dermatologic issues other than the nail.  Thick deformed nail 1 bil.  Pinch callus left hallux.  Left foot bunion asymptomatic.  Assessment: Painful deformed nail both great toes.  Mycotic nails both halluces.  Plan: Both thick nails grinded down to thin on both great toe

## 2015-12-18 NOTE — Patient Instructions (Signed)
Seen for hypertrophic nails. All nails debrided. Return in 3 months or as needed.  

## 2016-03-18 ENCOUNTER — Ambulatory Visit (INDEPENDENT_AMBULATORY_CARE_PROVIDER_SITE_OTHER): Payer: Medicare Other | Admitting: Podiatry

## 2016-03-18 ENCOUNTER — Encounter: Payer: Self-pay | Admitting: Podiatry

## 2016-03-18 VITALS — BP 150/79 | HR 76

## 2016-03-18 DIAGNOSIS — B351 Tinea unguium: Secondary | ICD-10-CM

## 2016-03-18 DIAGNOSIS — L608 Other nail disorders: Secondary | ICD-10-CM

## 2016-03-18 DIAGNOSIS — M21612 Bunion of left foot: Secondary | ICD-10-CM

## 2016-03-18 NOTE — Patient Instructions (Signed)
Seen for thick dystrophic great toe nails.  Affected nails grinded down. Return in 3 months or as needed.

## 2016-03-18 NOTE — Progress Notes (Signed)
Subjective:  70 year old female presents requesting both great toe nails grinded rather than trimmed.  They are thick and hurt in closed in shoes.   Objective:  Neurovascular status are within normal.  Thick deformed nail both great toe symptomatic. Red friction on left great toe at distal end.   Left foot bunion asymptomatic. Hyperextended left great toe with friction redness at distal dorsal end.  Assessment: Painful deformed nail both great toes.  Mycotic nails both halluces.  Plan: Both thick nails grinded down to thin on both great toe

## 2016-04-29 ENCOUNTER — Ambulatory Visit
Admission: RE | Admit: 2016-04-29 | Discharge: 2016-04-29 | Disposition: A | Discharge status: Home / Self Care | Payer: Medicare Other | Source: Ambulatory Visit | Attending: Obstetrics | Admitting: Obstetrics

## 2016-04-29 DIAGNOSIS — Z1239 Encounter for other screening for malignant neoplasm of breast: Secondary | ICD-10-CM

## 2016-06-17 ENCOUNTER — Ambulatory Visit: Payer: Medicare Other | Admitting: Podiatry

## 2016-06-23 ENCOUNTER — Ambulatory Visit: Payer: Medicare Other | Admitting: Podiatry

## 2016-07-21 ENCOUNTER — Ambulatory Visit: Payer: Medicare Other | Admitting: Podiatry

## 2016-07-28 ENCOUNTER — Ambulatory Visit (INDEPENDENT_AMBULATORY_CARE_PROVIDER_SITE_OTHER): Payer: Medicare Other | Admitting: Podiatry

## 2016-07-28 ENCOUNTER — Encounter: Payer: Self-pay | Admitting: Podiatry

## 2016-07-28 DIAGNOSIS — L570 Actinic keratosis: Secondary | ICD-10-CM

## 2016-07-28 DIAGNOSIS — M79672 Pain in left foot: Secondary | ICD-10-CM

## 2016-07-28 DIAGNOSIS — M79671 Pain in right foot: Secondary | ICD-10-CM

## 2016-07-28 DIAGNOSIS — L608 Other nail disorders: Secondary | ICD-10-CM

## 2016-07-28 DIAGNOSIS — B351 Tinea unguium: Secondary | ICD-10-CM | POA: Diagnosis not present

## 2016-07-28 DIAGNOSIS — M2041 Other hammer toe(s) (acquired), right foot: Secondary | ICD-10-CM | POA: Diagnosis not present

## 2016-07-28 NOTE — Progress Notes (Signed)
Subjective:  71 year old female presents requesting both great toe nails grinded rather than trimmed.  Patient also relates a history getting blisters on top of 4th web space with fever. The area is dry now.   Objective:  Dermatologic:  Thick dystrophic nails on both great toe. Interdigital keratotic tissue in 4th web space with dry scaly skin over the 4th web space left foot.  Neurovascular status are within normal.  Orthopedic: Hyperemic red skin over the distal dorsal end of both great toe with hyperextended IPJ bilateral.  Enlarged phalangeal head 5th proximal right with interdigital lesion.  Assessment: Painful deformed nail both great toes.  Soft corn 4th web space with recent history of inflammation. Mycotic nails both halluces. 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  Plan: Both thick nails grinded down to thin on both great toe. Interdigital space lesion debrided. Reviewed findings and advised to return if pain or lesion returns.

## 2016-07-28 NOTE — Patient Instructions (Signed)
Seen for hypertrophic nails and interdigital lesion right. Right 4th web space lesion and both great toe nails debrided. Return in 3 months or as needed.

## 2016-08-05 ENCOUNTER — Ambulatory Visit: Payer: Medicare Other | Attending: Physician Assistant | Admitting: Physical Therapy

## 2016-08-05 ENCOUNTER — Encounter: Payer: Self-pay | Admitting: Physical Therapy

## 2016-08-05 DIAGNOSIS — M25661 Stiffness of right knee, not elsewhere classified: Secondary | ICD-10-CM | POA: Diagnosis present

## 2016-08-05 DIAGNOSIS — R262 Difficulty in walking, not elsewhere classified: Secondary | ICD-10-CM | POA: Insufficient documentation

## 2016-08-05 DIAGNOSIS — M25561 Pain in right knee: Secondary | ICD-10-CM

## 2016-08-05 DIAGNOSIS — M6281 Muscle weakness (generalized): Secondary | ICD-10-CM | POA: Insufficient documentation

## 2016-08-05 DIAGNOSIS — M25662 Stiffness of left knee, not elsewhere classified: Secondary | ICD-10-CM | POA: Insufficient documentation

## 2016-08-06 ENCOUNTER — Encounter: Payer: Self-pay | Admitting: Physical Therapy

## 2016-08-06 NOTE — Therapy (Signed)
[facility] [address] [place], [place], [address] Phone: [phone]   Fax:  [phone]  Physical Therapy Evaluation  Patient Details  Name: [name] MRN: [id number] Date of Birth: 05/17/1946 Referring Provider: Loreli Dollar [facility]   Encounter Date: 08/05/2016      PT End of Session - 08/06/16 1123    Visit Number 1   Number of Visits 16   Date for PT Re-Evaluation 10/01/16   Authorization Type [employer] MCR    PT Start Time 0930   PT Stop Time 1015   PT Time Calculation (min) 45 min   Activity Tolerance Patient tolerated treatment well   Behavior During Therapy [facility] for tasks assessed/performed      Past Medical History:  Diagnosis Date  . Hypertension     Past Surgical History:  Procedure Laterality Date  . MEDIAL PARTIAL KNEE REPLACEMENT Bilateral 06/07/12    There were no vitals filed for this visit.       Subjective Assessment - 08/05/16 0932    Subjective Patient had bilateral uni-compartmental knee replacements done in 2014. Both knees have been doing well until about 2 weeks ago she stood outside in [place] for some time in the rainy cold. Since that point she has had right knee pain. Her pain is worst with activity. She has started taking celebrex which has helped her pain in her rght knee. She has felt like her left knee has started buxkling at times now.    Limitations Walking;Standing   How long can you stand comfortably? < 20 min    How long can you walk comfortably? limited community distances with pain    Currently in Pain? Yes   Pain Score 7    Pain Location Knee   Pain Orientation Right   Pain Descriptors / Indicators Aching   Pain Type Acute pain   Pain Radiating Towards raidating into the lower leg    Pain Onset 1 to 4 weeks ago   Aggravating Factors  standing, walking    Pain Relieving Factors rest,    Effect of Pain on Daily Activities Difficulty walking long distances.    Multiple Pain Sites  No            [facility] PT Assessment - 08/06/16 0001      Assessment   Medical Diagnosis Right knee pain    Referring Provider [facility]    Onset Date/Surgical Date 07/22/16   Hand Dominance Right   Next MD Visit None scheduled    Prior Therapy For knees in 2014     Precautions   Precautions Fall   Precaution Comments has had falls over the past few weeks      Restrictions   Weight Bearing Restrictions No     Home Environment   Additional Comments 1 step into the house      Prior Function   Level of Independence Independent   Vocation Retired   Leisure walking in the community      Cognition   Overall Cognitive Status Within Functional Limits for tasks assessed   Attention Focused   Focused Attention Appears intact   Memory Appears intact   Awareness Appears intact   Problem Solving Appears intact     Observation/Other Assessments   Focus on Therapeutic Outcomes (FOTO)  66% limitation 49 % expected      Sensation   Additional Comments the middle finger on each side     Coordination  Gross Motor Movements are Fluid and Coordinated Yes   Fine Motor Movements are Fluid and Coordinated Yes     Posture/Postural Control   Posture Comments rounded shoulders, slight vlagus in the knees;      AROM   Right Knee Extension 0   Right Knee Flexion 100   Left Knee Extension 0   Left Knee Flexion 98     PROM   Right Knee Extension 0   Right Knee Flexion 106  with pain    Left Knee Extension 0   Left Knee Flexion 103   tight end feel      Strength   Right Hip Flexion 4/5   Right Hip ADduction 3/5   Left Hip Flexion 4/5   Left Hip ABduction 4/5   Left Hip ADduction 4+/5   Right Knee Flexion 4/5   Right Knee Extension 3+/5   Left Knee Flexion 3+/5   Left Knee Extension 3+/5     Palpation   Patella mobility limited patella mobility in both knees    Palpation comment tednerenss to palpation in the right knee cap      Special Tests    Special Tests --   (+) patellar compression test R      Transfers   Transfers Sit to Stand   Sit to Stand 7: Independent  uses hands stand      Ambulation/Gait   Gait Comments decreased hip flexion bilateral; decreased weight bearing bilateral                    [facility] Adult PT Treatment/Exercise - 08/06/16 0001      Knee/Hip Exercises: Supine   Quad Sets Limitations x10 bilateral with towell    Heel Slides Limitations with strap to the edge of pain 5x5 sec holds    Straight Leg Raises Limitations 2x5 bilateral    Other Supine Knee/Hip Exercises supine march 2x10    Other Supine Knee/Hip Exercises patellar mobiliztions                 PT Education - 08/06/16 1122    Education provided Yes   Education Details HEP, symptom management, improtance of strengthening the left leg as well.    Person(s) Educated Patient   Methods Explanation;Demonstration;Verbal cues;Tactile cues;Handout   Comprehension Verbalized understanding;Returned demonstration          PT Short Term Goals - 08/06/16 1135      PT SHORT TERM GOAL #1   Title patient will increase bilateral left and right hip and knee strength to 4/5    Time 4   Period Weeks   Status New     PT SHORT TERM GOAL #2   Title Patient will increase bilateral knee flexion to 5/5    Time 4   Period Weeks   Status New     PT SHORT TERM GOAL #3   Title Patient will report 2/10 pain at worst in her R knee    Time 4   Period Weeks   Status New     PT SHORT TERM GOAL #4   Title Patient will be independent with initial HEP    Time 4   Period Weeks   Status New           PT Long Term Goals - 08/06/16 1137      PT LONG TERM GOAL #1   Title Patient will stand for 1 hour without increased pain    Time 8  Period Weeks   Status New     PT LONG TERM GOAL #2   Title Patient will go up and down 4 steps without pain in order to improve her ability to ambulate in the community    Time 8   Period Weeks   Status New      PT LONG TERM GOAL #3   Title Patient will demsotrate 5/5 gross bilateral lower extremity strength in order to decrease her chance of falls    Time 8   Period Weeks   Status New     PT LONG TERM GOAL #4   Title Patient will score a 49% limitation on FOTO in order to show improved function   Time 8   Period Weeks   Status New               Plan - 08/06/16 1126    Clinical Impression Statement Patient is a 71 year old female with left knee pain. She has some weakness on the right but she has more weakness on her left. She feels like her left leg gives sometimes. She has increased pain with ambualtion and standing for long periods. She would benefit from skilled therapy to improve strength and decrease right knee strength.     Rehab Potential Good   PT Frequency 2x / week   PT Duration 8 weeks   PT Treatment/Interventions ADLs/Self Care Home Management;Cryotherapy;Electrical Stimulation;Iontophoresis 4mg /ml Dexamethasone;Therapeutic activities;Therapeutic exercise;Patient/family education;Moist Heat;Ultrasound;Gait training;Stair training;Manual techniques;Taping;Passive range of motion;Functional mobility training   PT Next Visit Plan review HEP, progress as able, consider SAQ, hip abduction with band, heel slide standing heel raise, standing march, Work on light range of motion on both knees. At this point she may not be able to gai too much but if she could get to 110 that would be more functional. Consider patellar mobilizations if they are not too painful.    PT Home Exercise Plan supine march; quad set, patellar mob, heel slide with strap; straight leg raise    Consulted and Agree with Plan of Care Patient      Patient will benefit from skilled therapeutic intervention in order to improve the following deficits and impairments:  Abnormal gait, Decreased activity tolerance, Decreased strength, Decreased mobility, Pain, Increased muscle spasms, Decreased endurance, Difficulty  walking, Decreased range of motion, Decreased safety awareness  Visit Diagnosis: Acute pain of right knee - Plan: PT plan of care cert/re-cert  Stiffness of right knee, not elsewhere classified - Plan: PT plan of care cert/re-cert  Stiffness of left knee, not elsewhere classified - Plan: PT plan of care cert/re-cert  Difficulty in walking, not elsewhere classified - Plan: PT plan of care cert/re-cert  Muscle weakness (generalized) - Plan: PT plan of care cert/re-cert      G-Codes - 08/06/16 1153    Functional Limitation Mobility: Walking and moving around   Mobility: Walking and Moving Around Current Status [phone]([id number]) At least 60 percent but less than 80 percent impaired, limited or restricted   Mobility: Walking and Moving Around Goal Status [phone]([id number]) At least 40 percent but less than 60 percent impaired, limited or restricted       Problem List Patient Active Problem List   Diagnosis Date Noted  . Bunion 08/21/2014  . 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 Onychomycosis 02/28/2013  . Pain, foot 02/28/2013    [name] Comaavid J Dmario Russom  PT DPT  08/06/2016, 11:59 AM  [facility] [address] [place], [address] Phone: [phone]   Fax:  [phone]  Name: MAILI SHUTTERS MRN: [id number] Date of Birth: Aug 23, 1945

## 2016-08-10 ENCOUNTER — Ambulatory Visit: Payer: Medicare Other | Admitting: Physical Therapy

## 2016-08-10 DIAGNOSIS — M6281 Muscle weakness (generalized): Secondary | ICD-10-CM

## 2016-08-10 DIAGNOSIS — M25561 Pain in right knee: Secondary | ICD-10-CM | POA: Diagnosis not present

## 2016-08-10 DIAGNOSIS — R262 Difficulty in walking, not elsewhere classified: Secondary | ICD-10-CM

## 2016-08-10 DIAGNOSIS — M25661 Stiffness of right knee, not elsewhere classified: Secondary | ICD-10-CM

## 2016-08-10 DIAGNOSIS — M25662 Stiffness of left knee, not elsewhere classified: Secondary | ICD-10-CM

## 2016-08-10 NOTE — Patient Instructions (Signed)
Remove tape if irritating 

## 2016-08-10 NOTE — Therapy (Signed)
[place], [place], [address] Phone: [phone]   Fax:  [phone]  Physical Therapy Treatment  Patient Details  Name: [name] MRN: [id number] Date of Birth: [birth year] Referring Provider: Sharol Given [facility]   Encounter Date: 08/10/2016      PT End of Session - 08/10/16 1212    Visit Number 2   Number of Visits 16   Date for PT Re-Evaluation 10/01/16   PT Start Time 1103   PT Stop Time 1150   PT Time Calculation (min) 47 min   Activity Tolerance Patient tolerated treatment well   Behavior During Therapy [facility] for tasks assessed/performed      Past Medical History:  Diagnosis Date  . Hypertension     Past Surgical History:  Procedure Laterality Date  . MEDIAL PARTIAL KNEE REPLACEMENT Bilateral 06/07/12    There were no vitals filed for this visit.      Subjective Assessment - 08/10/16 1106    Subjective Doing her exercises.  Stepping in hole to her knee with left knee.  She did not injur.   Currently in Pain? Yes   Pain Score 7    Pain Location Knee   Pain Orientation Left;Medial  lrft knee medial.  right knee cap   Pain Descriptors / Indicators Aching   Pain Type Acute pain   Aggravating Factors  standing walking, stepped in the hole   Pain Relieving Factors rest ice. elevation   Effect of Pain on Daily Activities painful to walk   Multiple Pain Sites --  back pain 6/10 intermitant,  exercise flares"s   change in walking.                          [place] Adult PT Treatment/Exercise - 08/10/16 0001      Knee/Hip Exercises: Supine   Quad Sets Limitations 10  poor contraction uses buttocks .    painful   Short Arc Quad Sets 10 reps   Heel Slides Limitations strap right,  PROM/AAROM left   Straight Leg Raises Limitations 8 RT,  7 LT   Other Supine Knee/Hip Exercises supine march 2x10    Other Supine Knee/Hip Exercises patellar mobiliztions      Cryotherapy   Number Minutes Cryotherapy 10 Minutes   Cryotherapy Location Knee  left,  concurrent with exercises   Type of Cryotherapy --  cold pack., leg elevated     Manual Therapy   Manual Therapy Taping   Kinesiotex Edema;Inhibit Muscle;Facilitate Muscle;Ligament Correction     Kinesiotix   Edema left knee medial lateral   Inhibit Muscle  lower leg both   Facilitate Muscle  quads both   Ligament Correction medial left knee                  PT Short Term Goals - 08/06/16 1135      PT SHORT TERM GOAL #1   Title patient will increase bilateral left and right hip and knee strength to 4/5    Time 4   Period Weeks   Status New     PT SHORT TERM GOAL #2   Title Patient will increase bilateral knee flexion to 5/5    Time 4   Period Weeks   Status New     PT SHORT TERM GOAL #3   Title Patient will report 2/10 pain at worst in her R knee    Time 4  Period Weeks   Status New     PT SHORT TERM GOAL #4   Title Patient will be independent with initial HEP    Time 4   Period Weeks   Status New           PT Long Term Goals - 08/06/16 1137      PT LONG TERM GOAL #1   Title Patient will stand for 1 hour without increased pain    Time 8   Period Weeks   Status New     PT LONG TERM GOAL #2   Title Patient will go up and down 4 steps without pain in order to improve her ability to ambulate in the community    Time 8   Period Weeks   Status New     PT LONG TERM GOAL #3   Title Patient will demsotrate 5/5 gross bilateral lower extremity strength in order to decrease her chance of falls    Time 8   Period Weeks   Status New     PT LONG TERM GOAL #4   Title Patient will score a 49% limitation on FOTO in order to show improved function   Time 8   Period Weeks   Status New               Plan - 08/10/16 1213    Clinical Impression Statement medial knee soreness with stepping in a hole up to knee left yesterday.  Gentle exercises reveal ROM basically  unchanged from initial eval.  Minimal cues needed with home exercises.  Quad contraction porr today with anterior knee pain limiting. Tape trial.No increased pain at end ofsession.  No new goals met   PT Next Visit Plan answer any questions about HEP  Check tape,, progress as able, consider SAQ, hip abduction with band, heel slide standing heel raise, standing march, Work on light range of motion on both knees. At this point she may not be able to gai too much but if she could get to 110 that would be more functional. Consider patellar mobilizations if they are not too painful.    PT Home Exercise Plan supine march; quad set, patellar mob, heel slide with strap; straight leg raise    Consulted and Agree with Plan of Care Patient      Patient will benefit from skilled therapeutic intervention in order to improve the following deficits and impairments:  Abnormal gait, Decreased activity tolerance, Decreased strength, Decreased mobility, Pain, Increased muscle spasms, Decreased endurance, Difficulty walking, Decreased range of motion, Decreased safety awareness  Visit Diagnosis: Acute pain of right knee  Stiffness of right knee, not elsewhere classified  Stiffness of left knee, not elsewhere classified  Difficulty in walking, not elsewhere classified  Muscle weakness (generalized)     Problem List Patient Active Problem List   Diagnosis Date Noted  . Bunion 08/21/2014  . 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 Onychomycosis 02/28/2013  . Pain, foot 02/28/2013    HARRIS,KAREN PTA 08/10/2016, 12:19 PM  [facility] [address] [place], [place], [address] Phone: [phone]   Fax:  [phone]  Name: [name] MRN: [id number] Date of Birth: 1945/06/11

## 2016-08-11 ENCOUNTER — Ambulatory Visit: Payer: Medicare Other | Admitting: Physical Therapy

## 2016-08-11 ENCOUNTER — Encounter: Payer: Self-pay | Admitting: Physical Therapy

## 2016-08-11 DIAGNOSIS — M25561 Pain in right knee: Secondary | ICD-10-CM

## 2016-08-11 DIAGNOSIS — R262 Difficulty in walking, not elsewhere classified: Secondary | ICD-10-CM

## 2016-08-11 DIAGNOSIS — M6281 Muscle weakness (generalized): Secondary | ICD-10-CM

## 2016-08-11 DIAGNOSIS — M25662 Stiffness of left knee, not elsewhere classified: Secondary | ICD-10-CM

## 2016-08-11 DIAGNOSIS — M25661 Stiffness of right knee, not elsewhere classified: Secondary | ICD-10-CM

## 2016-08-11 NOTE — Therapy (Signed)
[facility] [address] [place], [place], [address] Phone: [phone]   Fax:  [phone]  Physical Therapy Treatment  Patient Details  Name: [name] MRN: [id number] Date of Birth: [birth year] Referring Provider: Loreli Dollar [facility]   Encounter Date: 08/11/2016      PT End of Session - 08/11/16 1222    Visit Number 3   Number of Visits 16   Date for PT Re-Evaluation 10/01/16   PT Start Time 1018   PT Stop Time 1112   PT Time Calculation (min) 54 min   Activity Tolerance Patient tolerated treatment well   Behavior During Therapy [facility] for tasks assessed/performed      Past Medical History:  Diagnosis Date  . Hypertension     Past Surgical History:  Procedure Laterality Date  . MEDIAL PARTIAL KNEE REPLACEMENT Bilateral 06/07/12    There were no vitals filed for this visit.      Subjective Assessment - 08/11/16 1019    Subjective She noticed 2 toes next to the big toe numb this morning (New)  No knee pain.   Currently in Pain? No/denies   Pain Location Knee   Pain Orientation Left   Multiple Pain Sites --  new numbness left foot 2 toes next to big tos,  Woke upwithj this morning.                          [facility] Adult PT Treatment/Exercise - 08/11/16 0001      Ambulation/Gait   Pre-Gait Activities wall slides double and single leg to increase activiation of right gluteal  heavy cues initially,  SBA     Knee/Hip Exercises: Aerobic   Nustep 5 minutes, L4,  legs only     Knee/Hip Exercises: Standing   Heel Raises 10 reps;Both  single legs   Wall Squat 10 reps  holding counter , cues initially     Knee/Hip Exercises: Supine   Short Arc Quad Sets 10 reps;2 sets   Short Arc Quad Sets Limitations 0, 3 LBS right, 0,3 LBS each set left  3 sets eack   Heel Slides 10 reps  10 second holds,     Heel Slides Limitations legs on red ball   Bridges Limitations 10   Other Supine Knee/Hip Exercises  supine march 2x10    Other Supine Knee/Hip Exercises supine clam with blue band 10 X     Cryotherapy   Number Minutes Cryotherapy 10 Minutes   Cryotherapy Location Knee   Type of Cryotherapy --  cold pack,  elevated     Manual Therapy   Manual Therapy Taping   Manual therapy comments Extra tape applied to rolled areas.                 PT Education - 08/11/16 1222    Education provided Yes   Education Details HEP   Person(s) Educated Patient   Methods Explanation;Demonstration;Tactile cues;Verbal cues;Handout   Comprehension Verbalized understanding;Returned demonstration          PT Short Term Goals - 08/06/16 1135      PT SHORT TERM GOAL #1   Title patient will increase bilateral left and right hip and knee strength to 4/5    Time 4   Period Weeks   Status New     PT SHORT TERM GOAL #2   Title Patient will increase bilateral knee flexion to 5/5    Time 4  Period Weeks   Status New     PT SHORT TERM GOAL #3   Title Patient will report 2/10 pain at worst in her R knee    Time 4   Period Weeks   Status New     PT SHORT TERM GOAL #4   Title Patient will be independent with initial HEP    Time 4   Period Weeks   Status New           PT Long Term Goals - 08/06/16 1137      PT LONG TERM GOAL #1   Title Patient will stand for 1 hour without increased pain    Time 8   Period Weeks   Status New     PT LONG TERM GOAL #2   Title Patient will go up and down 4 steps without pain in order to improve her ability to ambulate in the community    Time 8   Period Weeks   Status New     PT LONG TERM GOAL #3   Title Patient will demsotrate 5/5 gross bilateral lower extremity strength in order to decrease her chance of falls    Time 8   Period Weeks   Status New     PT LONG TERM GOAL #4   Title Patient will score a 49% limitation on FOTO in order to show improved function   Time 8   Period Weeks   Status New               Plan - 08/11/16  1223    Clinical Impression Statement No knee pain today unless palpated.  New toes numb both next to great toe. (Left) 4/10 pain noted prior to cold pack.  Tape helpful   PT Next Visit Plan review wall slides.  Work toward goals,  Consider standing step stretch for knee flexion.   PT Home Exercise Plan supine march; quad set, patellar mob, heel slide with strap; straight leg raise ,  pre gait wall slides.   Consulted and Agree with Plan of Care Patient      Patient will benefit from skilled therapeutic intervention in order to improve the following deficits and impairments:  Abnormal gait, Decreased activity tolerance, Decreased strength, Decreased mobility, Pain, Increased muscle spasms, Decreased endurance, Difficulty walking, Decreased range of motion, Decreased safety awareness  Visit Diagnosis: Acute pain of right knee  Stiffness of right knee, not elsewhere classified  Stiffness of left knee, not elsewhere classified  Difficulty in walking, not elsewhere classified  Muscle weakness (generalized)     Problem List Patient Active Problem List   Diagnosis Date Noted  . Bunion 08/21/2014  . 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 Onychomycosis 02/28/2013  . Pain, foot 02/28/2013    HARRIS,KAREN PTA 08/11/2016, 12:27 PM  [facility] [address] [place], [place], [address] Phone: [phone]   Fax:  [phone]  Name: [name] MRN: [id number] Date of Birth: 1945-06-02

## 2016-08-11 NOTE — Patient Instructions (Signed)
From exercise drawer. Pre gait wall slides single and double,  Daily 10 to 30 Reps.

## 2016-08-17 ENCOUNTER — Ambulatory Visit: Payer: Medicare Other | Attending: Physician Assistant | Admitting: Physical Therapy

## 2016-08-17 ENCOUNTER — Encounter: Payer: Self-pay | Admitting: Physical Therapy

## 2016-08-17 DIAGNOSIS — M25662 Stiffness of left knee, not elsewhere classified: Secondary | ICD-10-CM | POA: Diagnosis present

## 2016-08-17 DIAGNOSIS — M25561 Pain in right knee: Secondary | ICD-10-CM | POA: Diagnosis not present

## 2016-08-17 DIAGNOSIS — R262 Difficulty in walking, not elsewhere classified: Secondary | ICD-10-CM | POA: Diagnosis present

## 2016-08-17 DIAGNOSIS — M25661 Stiffness of right knee, not elsewhere classified: Secondary | ICD-10-CM | POA: Diagnosis present

## 2016-08-17 DIAGNOSIS — M6281 Muscle weakness (generalized): Secondary | ICD-10-CM | POA: Diagnosis present

## 2016-08-17 NOTE — Therapy (Signed)
[facility] [address] [place], [place], [address] Phone: [phone]   Fax:  [phone]  Physical Therapy Treatment  Patient Details  Name: [name] MRN: [id number] Date of Birth: Oct 24, 1945 Referring Provider: Loreli Dollar [facility]   Encounter Date: 08/17/2016      PT End of Session - 08/17/16 1018    Visit Number 5   Number of Visits 16   Date for PT Re-Evaluation 10/01/16   PT Start Time 0930   PT Stop Time 1016   PT Time Calculation (min) 46 min   Activity Tolerance Patient tolerated treatment well   Behavior During Therapy [facility] for tasks assessed/performed      Past Medical History:  Diagnosis Date  . Hypertension     Past Surgical History:  Procedure Laterality Date  . MEDIAL PARTIAL KNEE REPLACEMENT Bilateral 06/07/12    There were no vitals filed for this visit.      Subjective Assessment - 08/17/16 0929    Subjective Toes stopped being numb before I went to [facility] on Friday.  I walked 1/2 hour.    Currently in Pain? Yes   Pain Score 5    Pain Location Knee   Pain Orientation Right;Anterior   Pain Descriptors / Indicators Tender   Pain Radiating Towards No   Pain Frequency Intermittent   Aggravating Factors  stretching   Effect of Pain on Daily Activities painful walking on hills,  intermitant pain with walking   Multiple Pain Sites No            [facility] PT Assessment - 08/17/16 0001      AROM   Right Knee Flexion 115   Left Knee Flexion 105                     [facility] Adult PT Treatment/Exercise - 08/17/16 0001      Knee/Hip Exercises: Stretches   Passive Hamstring Stretch 1 rep;30 seconds  both   Quad Stretch 10 seconds  10 X standing step stretch     Knee/Hip Exercises: Aerobic   Nustep 6 minutes  Level 4     Knee/Hip Exercises: Machines for Strengthening   Hip Cybex 1 plate 10 x abduction, both     Knee/Hip Exercises: Standing   SLS with Vectors at mat ball toss  catch 10 x with SLS wobbles.   Other Standing Knee Exercises Pre gait wall slides 10 x each     Knee/Hip Exercises: Seated   Hamstring Curl 2 sets;10 reps     Knee/Hip Exercises: Supine   Quad Sets 10 reps   Short Arc Quad Sets Strengthening;Both;2 sets   Short Arc Quad Sets Limitations 3 LBS 2 sets of 10   Heel Slides Limitations legs on red ball  10 x   Bridges with Clamshell 1 set;10 reps  blue band                  PT Short Term Goals - 08/17/16 1021      PT SHORT TERM GOAL #1   Title patient will increase bilateral left and right hip and knee strength to 4/5    Baseline working to strengthen.     Time 4   Period Weeks   Status On-going     PT SHORT TERM GOAL #2   Title Patient will increase bilateral knee flexion to 5/5    Baseline working to strengthen   Time 4   Period Weeks  Status On-going     PT SHORT TERM GOAL #3   Title Patient will report 2/10 pain at worst in her R knee    Baseline 8/10 walking 30 minutes in airort right knee   Time 4   Period Weeks   Status On-going     PT SHORT TERM GOAL #4   Title Patient will be independent with initial HEP    Baseline independent so far   Time 4   Period Weeks   Status On-going           PT Long Term Goals - 08/06/16 1137      PT LONG TERM GOAL #1   Title Patient will stand for 1 hour without increased pain    Time 8   Period Weeks   Status New     PT LONG TERM GOAL #2   Title Patient will go up and down 4 steps without pain in order to improve her ability to ambulate in the community    Time 8   Period Weeks   Status New     PT LONG TERM GOAL #3   Title Patient will demsotrate 5/5 gross bilateral lower extremity strength in order to decrease her chance of falls    Time 8   Period Weeks   Status New     PT LONG TERM GOAL #4   Title Patient will score a 49% limitation on FOTO in order to show improved function   Time 8   Period Weeks   Status New               Plan -  08/17/16 1018    Clinical Impression Statement No pain at end of session.  AROM right flexion 115,  LT:  105.  Numbness in toes resolved.  Patrient is adherent with her HEP.  Tape was helpful.  She is still wearing.    PT Next Visit Plan Continue standing step stretch,  Hip and knee strengthening.   PT Home Exercise Plan supine march; quad set, patellar mob, heel slide with strap; straight leg raise ,  pre gait wall slides.   Consulted and Agree with Plan of Care Patient      Patient will benefit from skilled therapeutic intervention in order to improve the following deficits and impairments:  Abnormal gait, Decreased activity tolerance, Decreased strength, Decreased mobility, Pain, Increased muscle spasms, Decreased endurance, Difficulty walking, Decreased range of motion, Decreased safety awareness  Visit Diagnosis: Acute pain of right knee  Stiffness of right knee, not elsewhere classified  Stiffness of left knee, not elsewhere classified  Difficulty in walking, not elsewhere classified  Muscle weakness (generalized)     Problem List Patient Active Problem List   Diagnosis Date Noted  . Bunion 08/21/2014  . 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 Onychomycosis 02/28/2013  . Pain, foot 02/28/2013    Diala Waxman PTA 08/17/2016, 10:24 AM  [facility] [address] [place], [place], [address] Phone: [phone]   Fax:  [phone]  Name: NATORIA ARCHIBALD MRN: [id number] Date of Birth: 06-Apr-1946

## 2016-08-19 ENCOUNTER — Ambulatory Visit: Payer: Medicare Other | Admitting: Physical Therapy

## 2016-08-19 DIAGNOSIS — M25661 Stiffness of right knee, not elsewhere classified: Secondary | ICD-10-CM

## 2016-08-19 DIAGNOSIS — M25561 Pain in right knee: Secondary | ICD-10-CM

## 2016-08-19 DIAGNOSIS — M6281 Muscle weakness (generalized): Secondary | ICD-10-CM

## 2016-08-19 DIAGNOSIS — M25662 Stiffness of left knee, not elsewhere classified: Secondary | ICD-10-CM

## 2016-08-19 DIAGNOSIS — R262 Difficulty in walking, not elsewhere classified: Secondary | ICD-10-CM

## 2016-08-20 NOTE — Therapy (Signed)
[facility] [address] [place], [place], [address] Phone: [phone]   Fax:  [phone]  Physical Therapy Treatment  Patient Details  Name: [name] MRN: [id number] Date of Birth: 12-Mar-1946 Referring Provider: Loreli Dollar [facility]   Encounter Date: 08/19/2016      PT End of Session - 08/19/16 0935    Visit Number 6   Number of Visits 16   Date for PT Re-Evaluation 10/01/16   Authorization Type [employer] MCR    PT Start Time 0930   PT Stop Time 1013   PT Time Calculation (min) 43 min      Past Medical History:  Diagnosis Date  . Hypertension     Past Surgical History:  Procedure Laterality Date  . MEDIAL PARTIAL KNEE REPLACEMENT Bilateral 06/07/12    There were no vitals filed for this visit.      Subjective Assessment - 08/19/16 0933    Subjective Toe numbness has not reurned.    Currently in Pain? Yes   Pain Score 4    Pain Location Knee   Pain Orientation Right;Lateral;Anterior   Pain Descriptors / Indicators Dull;Aching                         [facility] Adult PT Treatment/Exercise - 08/20/16 0001      Knee/Hip Exercises: Stretches   Knee: Self-Stretch Limitations scoot to edge of seat with foot back      Knee/Hip Exercises: Aerobic   Nustep 6 minutes  Level 5     Knee/Hip Exercises: Standing   Hip Abduction 20 reps   Abduction Limitations cues for posture   Lateral Step Up 15 reps;Hand Hold: 1;Step Height: 4"   Lateral Step Up Limitations both   Forward Step Up 15 reps;Hand Hold: 1;Step Height: 6"   Forward Step Up Limitations both   Stairs climbed reciprocal stairs up, step go gait down,, avoids left knee pain. Able to perform reciprocal down however has left knee tightness/pain.      Knee/Hip Exercises: Supine   Short Arc Quad Sets Strengthening;Both;2 sets   Short Arc Quad Sets Limitations 4# 2 sets of 10    Heel Slides Limitations legs on red ball  10 x with manual resistance    Bridges Limitations 10   Straight Leg Raises Limitations 10 x 2 bilateral    Other Supine Knee/Hip Exercises supine clam with blue band 10 X  x 2                  PT Short Term Goals - 08/17/16 1021      PT SHORT TERM GOAL #1   Title patient will increase bilateral left and right hip and knee strength to 4/5    Baseline working to strengthen.     Time 4   Period Weeks   Status On-going     PT SHORT TERM GOAL #2   Title Patient will increase bilateral knee flexion to 5/5    Baseline working to strengthen   Time 4   Period Weeks   Status On-going     PT SHORT TERM GOAL #3   Title Patient will report 2/10 pain at worst in her R knee    Baseline 8/10 walking 30 minutes in airort right knee   Time 4   Period Weeks   Status On-going     PT SHORT TERM GOAL #4   Title Patient will be independent with initial  HEP    Baseline independent so far   Time 4   Period Weeks   Status On-going           PT Long Term Goals - 08/06/16 1137      PT LONG TERM GOAL #1   Title Patient will stand for 1 hour without increased pain    Time 8   Period Weeks   Status New     PT LONG TERM GOAL #2   Title Patient will go up and down 4 steps without pain in order to improve her ability to ambulate in the community    Time 8   Period Weeks   Status New     PT LONG TERM GOAL #3   Title Patient will demsotrate 5/5 gross bilateral lower extremity strength in order to decrease her chance of falls    Time 8   Period Weeks   Status New     PT LONG TERM GOAL #4   Title Patient will score a 49% limitation on FOTO in order to show improved function   Time 8   Period Weeks   Status New               Plan - 08/19/16 1015    Clinical Impression Statement Pt reports she always has some bilateral knee pain. She denies overall improvement at this point. Continued open chain and closed chain hip/knee strengthening to progress patient toward goals. Pt with difficulty descending  stairs with alternating pattern due to left knee tightness. Instructed pt in seated scoot stretch to increase knee flexion ROM. She is unable to tolerate sidelying due to pain in biateral hips in this position. Performed standing hip abduction without increased pain.     PT Next Visit Plan Continue standing step stretch/ seated scoot stretch,  Hip and knee strengthening.   PT Home Exercise Plan supine march; quad set, patellar mob, heel slide with strap; straight leg raise ,  pre gait wall slides.   Consulted and Agree with Plan of Care Patient      Patient will benefit from skilled therapeutic intervention in order to improve the following deficits and impairments:  Abnormal gait, Decreased activity tolerance, Decreased strength, Decreased mobility, Pain, Increased muscle spasms, Decreased endurance, Difficulty walking, Decreased range of motion, Decreased safety awareness  Visit Diagnosis: Acute pain of right knee  Stiffness of right knee, not elsewhere classified  Stiffness of left knee, not elsewhere classified  Difficulty in walking, not elsewhere classified  Muscle weakness (generalized)     Problem List Patient Active Problem List   Diagnosis Date Noted  . Bunion 08/21/2014  . 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 Onychomycosis 02/28/2013  . Pain, foot 02/28/2013    Sherrie Mustache, PTA 08/20/2016, 10:37 AM  [facility] [address] [place], [place], [address] Phone: [phone]   Fax:  [phone]  Name: [name] MRN: [id number] Date of Birth: 08-Jul-1945

## 2016-08-24 ENCOUNTER — Ambulatory Visit: Payer: Medicare Other | Admitting: Physical Therapy

## 2016-08-24 DIAGNOSIS — M25561 Pain in right knee: Secondary | ICD-10-CM | POA: Diagnosis not present

## 2016-08-24 DIAGNOSIS — M25661 Stiffness of right knee, not elsewhere classified: Secondary | ICD-10-CM

## 2016-08-24 DIAGNOSIS — R262 Difficulty in walking, not elsewhere classified: Secondary | ICD-10-CM

## 2016-08-24 DIAGNOSIS — M25662 Stiffness of left knee, not elsewhere classified: Secondary | ICD-10-CM

## 2016-08-24 DIAGNOSIS — M6281 Muscle weakness (generalized): Secondary | ICD-10-CM

## 2016-08-24 NOTE — Therapy (Signed)
[facility] [address] [place], [place], [address] Phone: [phone]   Fax:  [phone]  Physical Therapy Treatment  Patient Details  Name: [name] MRN: [id number] Date of Birth: [birth year] Referring Provider: Loreli Dollar [facility]   Encounter Date: 08/24/2016      PT End of Session - 08/24/16 0934    Visit Number 7   Number of Visits 16   Date for PT Re-Evaluation 10/01/16   Authorization Type UHC MCR    PT Start Time 0930   PT Stop Time 1012   PT Time Calculation (min) 42 min   Activity Tolerance Patient tolerated treatment well   Behavior During Therapy [facility] for tasks assessed/performed      Past Medical History:  Diagnosis Date  . Hypertension     Past Surgical History:  Procedure Laterality Date  . MEDIAL PARTIAL KNEE REPLACEMENT Bilateral 06/07/12    There were no vitals filed for this visit.      Subjective Assessment - 08/24/16 0932    Subjective Patient is not having pain in her right knee today. She continues to have no numbness in her toe. She reports her hips have been sore the past few days.    Limitations Walking;Standing   How long can you stand comfortably? < 20 min    How long can you walk comfortably? limited community distances with pain    Currently in Pain? No/denies            [facility] PT Assessment - 08/24/16 0001      AROM   Right Knee Flexion 116   Left Knee Flexion 105     Strength   Right Hip Flexion 4+/5   Right Hip ADduction 4+/5   Left Hip Flexion 4+/5   Left Hip ABduction 4+/5   Left Hip ADduction 4+/5   Right Knee Flexion 5/5   Right Knee Extension 5/5   Left Knee Flexion 5/5   Left Knee Extension 5/5                     [facility] Adult PT Treatment/Exercise - 08/24/16 0001      Knee/Hip Exercises: Stretches   Passive Hamstring Stretch 1 rep;30 seconds  both   Other Knee/Hip Stretches piriformis stretch 3x15 sec hodls each knee      Knee/Hip  Exercises: Aerobic   Nustep 5 min      Knee/Hip Exercises: Standing   Hip Abduction 20 reps   Abduction Limitations cues for posture   Lateral Step Up 15 reps;Hand Hold: 1;Step Height: 4"   Lateral Step Up Limitations both   Forward Step Up 15 reps;Hand Hold: 1;Step Height: 6"   Forward Step Up Limitations both     Knee/Hip Exercises: Supine   Short Arc Quad Sets Strengthening;Both;2 sets   Short Arc Quad Sets Limitations 3lb 2x10    Heel Slides Limitations legs on red ball  10 x with manual resistance    Straight Leg Raises Limitations 10 x 2 bilateral    Other Supine Knee/Hip Exercises supine clam with green band                 PT Education - 08/24/16 0933    Education provided Yes   Education Details continue with HEP    Person(s) Educated Patient   Methods Explanation;Demonstration;Tactile cues;Handout   Comprehension Verbalized understanding;Returned demonstration          PT Short Term Goals - 08/24/16 1537  PT SHORT TERM GOAL #1   Title patient will increase bilateral left and right hip and knee strength to 4/5    Baseline 4+/5 gross left LE    Time 4   Period Weeks   Status Achieved     PT SHORT TERM GOAL #2   Title Patient will increase bilateral knee flexion to 5/5    Baseline 4+/5 left    Time 4   Period Weeks   Status On-going     PT SHORT TERM GOAL #3   Title Patient will report 2/10 pain at worst in her R knee    Baseline No pain in her knees today    Time 4   Period Weeks   Status On-going     PT SHORT TERM GOAL #4   Title Patient will be independent with initial HEP    Baseline independent so far   Time 4   Period Weeks   Status Achieved           PT Long Term Goals - 08/06/16 1137      PT LONG TERM GOAL #1   Title Patient will stand for 1 hour without increased pain    Time 8   Period Weeks   Status New     PT LONG TERM GOAL #2   Title Patient will go up and down 4 steps without pain in order to improve her  ability to ambulate in the community    Time 8   Period Weeks   Status New     PT LONG TERM GOAL #3   Title Patient will demsotrate 5/5 gross bilateral lower extremity strength in order to decrease her chance of falls    Time 8   Period Weeks   Status New     PT LONG TERM GOAL #4   Title Patient will score a 49% limitation on FOTO in order to show improved function   Time 8   Period Weeks   Status New               Plan - 08/24/16 0935    Clinical Impression Statement Therapy scaled back her hip exercises slightly 2nd to hip soreness. She is making good progress. She will return to the gym over the next week. If she tolerates the gym well she may be approaching discharge, Therapy measured her strength and range and both have improved. Her left and right knee and hip strength have improved.    Rehab Potential Good   PT Frequency 2x / week   PT Duration 8 weeks   PT Treatment/Interventions ADLs/Self Care Home Management;Cryotherapy;Electrical Stimulation;Iontophoresis /ml Dexamethasone;Therapeutic activities;Therapeutic exercise;Patient/family education;Moist Heat;Ultrasound;Gait training;Stair training;Manual techniques;Taping;Passive range of motion;Functional mobility training   PT Next Visit Plan Continue standing step stretch/ seated scoot stretch,  Hip and knee strengthening.   PT Home Exercise Plan supine march; quad set, patellar mob, heel slide with strap; straight leg raise ,  pre gait wall slides.   Consulted and Agree with Plan of Care Patient      Patient will benefit from skilled therapeutic intervention in order to improve the following deficits and impairments:  Abnormal gait, Decreased activity tolerance, Decreased strength, Decreased mobility, Pain, Increased muscle spasms, Decreased endurance, Difficulty walking, Decreased range of motion, Decreased safety awareness  Visit Diagnosis: Acute pain of right knee  Stiffness of right knee, not elsewhere  classified  Stiffness of left knee, not elsewhere classified  Difficulty in walking, not elsewhere classified  Muscle  weakness (generalized)     Problem List Patient Active Problem List   Diagnosis Date Noted  . Bunion 08/21/2014  . 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 Onychomycosis 02/28/2013  . Pain, foot 02/28/2013    [name] Coma PT DPT  08/24/2016, 3:38 PM  [facility] [address] [place], [place], [address] Phone: [phone]   Fax:  [phone]  Name: [name] MRN: [id number] Date of Birth: [birth year]

## 2016-08-26 ENCOUNTER — Ambulatory Visit: Payer: Medicare Other | Admitting: Physical Therapy

## 2016-08-26 ENCOUNTER — Encounter: Payer: Self-pay | Admitting: Physical Therapy

## 2016-08-26 DIAGNOSIS — M25561 Pain in right knee: Secondary | ICD-10-CM | POA: Diagnosis not present

## 2016-08-26 DIAGNOSIS — M25661 Stiffness of right knee, not elsewhere classified: Secondary | ICD-10-CM

## 2016-08-26 DIAGNOSIS — M25662 Stiffness of left knee, not elsewhere classified: Secondary | ICD-10-CM

## 2016-08-26 DIAGNOSIS — R262 Difficulty in walking, not elsewhere classified: Secondary | ICD-10-CM

## 2016-08-26 DIAGNOSIS — M6281 Muscle weakness (generalized): Secondary | ICD-10-CM

## 2016-08-26 NOTE — Therapy (Signed)
[name] [address] [place], [place], [address] Phone: [phone]   Fax:  [phone]  Physical Therapy Treatment  Patient Details  Name: [name] TOWSON MRN: [id number] Date of Birth: 22-Apr-1946 Referring Provider: L[facility]   Encounter Date: 08/26/2016      PT End of Session - 08/26/16 1028    Visit Number 8   Number of Visits 16   Date for PT Re-Evaluation 10/01/16   Authorization Type UHC MCR    PT Start Time 0934   PT Stop Time 1015   PT Time Calculation (min) 41 min   Activity Tolerance Patient tolerated treatment well   Behavior During Therapy [facility] for tasks assessed/performed      Past Medical History:  Diagnosis Date  . Hypertension     Past Surgical History:  Procedure Laterality Date  . MEDIAL PARTIAL KNEE REPLACEMENT Bilateral 06/07/12    There were no vitals filed for this visit.      Subjective Assessment - 08/26/16 1027    Subjective Patient continues to report some left hip pain when she lies on it. She is having no knee pain. She has returned to the [facility] and ewalked amile yesterday without increased knee pain. She has been doing her exercises.    Limitations Walking;Standing   How long can you stand comfortably? < 20 min    How long can you walk comfortably? limited community distances with pain    Currently in Pain? No/denies                         [facility] Adult PT Treatment/Exercise - 08/26/16 0001      Knee/Hip Exercises: Stretches   Passive Hamstring Stretch 1 rep;30 seconds  both   Other Knee/Hip Stretches piriformis stretch 3x15 sec hodls each knee      Knee/Hip Exercises: Standing   Abduction Limitations x10 each leg   Extension Limitations x10 each leg   Forward Step Up Limitations 6" x10 each leg      Knee/Hip Exercises: Supine   Short Arc Quad Sets Strengthening;Both;2 sets   Short Arc Quad Sets Limitations 3lb 2x10    Heel Slides Limitations 105 sec     Bridges Limitations 10   Straight Leg Raises Limitations 10 x 2 bilateral    Other Supine Knee/Hip Exercises supine clam with green band                 PT Education - 08/26/16 1028    Education provided Yes   Education Details continue with HEP    Person(s) Educated Patient   Methods Explanation;Demonstration;Tactile cues   Comprehension Returned demonstration;Verbalized understanding          PT Short Term Goals - 08/24/16 1537      PT SHORT TERM GOAL #1   Title patient will increase bilateral left and right hip and knee strength to 4/5    Baseline 4+/5 gross left LE    Time 4   Period Weeks   Status Achieved     PT SHORT TERM GOAL #2   Title Patient will increase bilateral knee flexion to 5/5    Baseline 4+/5 left    Time 4   Period Weeks   Status On-going     PT SHORT TERM GOAL #3   Title Patient will report 2/10 pain at worst in her R knee    Baseline No pain in her knees today  Time 4   Period Weeks   Status On-going     PT SHORT TERM GOAL #4   Title Patient will be independent with initial HEP    Baseline independent so far   Time 4   Period Weeks   Status Achieved           PT Long Term Goals - 08/06/16 1137      PT LONG TERM GOAL #1   Title Patient will stand for 1 hour without increased pain    Time 8   Period Weeks   Status New     PT LONG TERM GOAL #2   Title Patient will go up and down 4 steps without pain in order to improve her ability to ambulate in the community    Time 8   Period Weeks   Status New     PT LONG TERM GOAL #3   Title Patient will demsotrate 5/5 gross bilateral lower extremity strength in order to decrease her chance of falls    Time 8   Period Weeks   Status New     PT LONG TERM GOAL #4   Title Patient will score a 49% limitation on FOTO in order to show improved function   Time 8   Period Weeks   Status New               Plan - 08/26/16 1029    Clinical Impression Statement Patient  tolerated treatment well. Her knee flexion has improvesd slightly bilateral. Therapy had the patient sttretch her knees today. She had minor soreness in her left hip with treatment. She is back to the gym. If her left hip soreness resolves she will likley be near discharge.     PT Treatment/Interventions ADLs/Self Care Home Management;Cryotherapy;Electrical Stimulation;Iontophoresis /ml Dexamethasone;Therapeutic activities;Therapeutic exercise;Patient/family education;Moist Heat;Ultrasound;Gait training;Stair training;Manual techniques;Taping;Passive range of motion;Functional mobility training   PT Next Visit Plan ,  Hip and knee strengthening. FOTO   PT Home Exercise Plan supine march; quad set, patellar mob, heel slide with strap; straight leg raise ,  pre gait wall slides.   Consulted and Agree with Plan of Care Patient      Patient will benefit from skilled therapeutic intervention in order to improve the following deficits and impairments:  Abnormal gait, Decreased activity tolerance, Decreased strength, Decreased mobility, Pain, Increased muscle spasms, Decreased endurance, Difficulty walking, Decreased range of motion, Decreased safety awareness  Visit Diagnosis: Acute pain of right knee  Stiffness of right knee, not elsewhere classified  Stiffness of left knee, not elsewhere classified  Difficulty in walking, not elsewhere classified  Muscle weakness (generalized)     Problem List Patient Active Problem List   Diagnosis Date Noted  . [name] 08/21/2014  . 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 Onychomycosis 02/28/2013  . Pain, foot 02/28/2013    [name] 08/26/2016, 10:36 AM  [name] [address] [place], [place], [address] Phone: [phone]   Fax:  [phone]  Name: [name] MRN: [id number] Date of Birth: Feb 29, 1946

## 2016-08-31 ENCOUNTER — Encounter: Payer: Self-pay | Admitting: Physical Therapy

## 2016-08-31 ENCOUNTER — Ambulatory Visit: Payer: Medicare Other | Admitting: Physical Therapy

## 2016-08-31 DIAGNOSIS — M25662 Stiffness of left knee, not elsewhere classified: Secondary | ICD-10-CM

## 2016-08-31 DIAGNOSIS — M25561 Pain in right knee: Secondary | ICD-10-CM

## 2016-08-31 DIAGNOSIS — M25661 Stiffness of right knee, not elsewhere classified: Secondary | ICD-10-CM

## 2016-08-31 DIAGNOSIS — M6281 Muscle weakness (generalized): Secondary | ICD-10-CM

## 2016-08-31 DIAGNOSIS — R262 Difficulty in walking, not elsewhere classified: Secondary | ICD-10-CM

## 2016-08-31 NOTE — Therapy (Signed)
[place], [place], [address] Phone: [phone]   Fax:  [phone]  Physical Therapy Treatment  Patient Details  Name: [name] MRN: [id number] Date of Birth: 1945-06-14 Referring Provider: [facility]   Encounter Date: 08/31/2016      PT End of Session - 08/31/16 1309    Visit Number 9   Number of Visits 19   Date for PT Re-Evaluation 10/01/16   PT Start Time 0932   PT Stop Time 1016   PT Time Calculation (min) 44 min   Activity Tolerance Patient tolerated treatment well   Behavior During Therapy [facility] for tasks assessed/performed      Past Medical History:  Diagnosis Date  . Hypertension     Past Surgical History:  Procedure Laterality Date  . MEDIAL PARTIAL KNEE REPLACEMENT Bilateral 06/07/12    There were no vitals filed for this visit.      Subjective Assessment - 08/31/16 1236    Subjective I joined silver sneakers.  She tried the elliptical at the [facility] and it hurt her knee,  It stretched to too far. I tried to use the tread mill, but it was too hard.   I feel fine today.     Currently in Pain? No/denies   Pain Score --  mild pain . no number given   Pain Location Knee   Pain Orientation Right;Anterior;Lateral   Pain Descriptors / Indicators Aching   Pain Radiating Towards No   Aggravating Factors  elliptical at [facility]   Pain Relieving Factors rest,                         [facility] Adult PT Treatment/Exercise - 08/31/16 0001      Knee/Hip Exercises: Aerobic   Tread Mill 2 minutes, to educate on use.  moderate cues   Nustep Level 5 X 6 minutes  also education on how to set up so she can use at Y     Knee/Hip Exercises: Supine   Short Arc [employer];Both   Short Arc [employer] Limitations 5 LBS 10 X2  No pain   Bridges Limitations 10   Straight Leg Raises Limitations 10 X both   Other Supine Knee/Hip Exercises isometric hip flexion10 X 5  seconds into big red ball,  isometric hand press into ball for abdominals.  10 X each                PT Education - 08/31/16 1308    Education provided Yes   Education Details How to use some gym equipment   Person(s) Educated Patient   Methods Explanation;Demonstration   Comprehension Verbalized understanding;Returned demonstration;Need further instruction          PT Short Term Goals - 08/31/16 0940      PT SHORT TERM GOAL #1   Title patient will increase bilateral left and right hip and knee strength to 4/5    Baseline 4+/5 gross left LE    Time 4   Period Weeks   Status Achieved     PT SHORT TERM GOAL #2   Title Patient will increase bilateral knee flexion to 5/5    Baseline 4+/5 left    Time 4   Period Weeks   Status Partially Met     PT SHORT TERM GOAL #3   Title Patient will report 2/10 pain at worst in her R knee    Baseline  2/10 worst in knees for last 2 days   Time 4   Period Weeks   Status Partially Met     PT SHORT TERM GOAL #4   Title Patient will be independent with initial HEP    Time 4   Period Weeks   Status Achieved           PT Long Term Goals - 08/31/16 1313      PT LONG TERM GOAL #4   Title Patient will score a 49% limitation on FOTO in order to show improved function   Baseline 72% ABILITY, 28% LIMITATION   Time 8   Period Weeks   Status Achieved               Plan - 08/31/16 1310    Clinical Impression Statement FOTO 72 % ability improved from Initial 34% ability. ltg#5 MET.   Patient is transistioning to [facility], [employer].  She is happy with her progress.    PT Next Visit Plan ,  Hip and knee strengthening.    PT Home Exercise Plan supine march; quad set, patellar mob, heel slide with strap; straight leg raise ,  pre gait wall slides.   Consulted and Agree with Plan of Care Patient      Patient will benefit from skilled therapeutic intervention in order to improve the following deficits and impairments:   Abnormal gait, Decreased activity tolerance, Decreased strength, Decreased mobility, Pain, Increased muscle spasms, Decreased endurance, Difficulty walking, Decreased range of motion, Decreased safety awareness  Visit Diagnosis: Acute pain of right knee  Stiffness of right knee, not elsewhere classified  Stiffness of left knee, not elsewhere classified  Difficulty in walking, not elsewhere classified  Muscle weakness (generalized)     Problem List Patient Active Problem List   Diagnosis Date Noted  . Bunion 08/21/2014  . 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 Onychomycosis 02/28/2013  . Pain, foot 02/28/2013    HARRIS,KAREN PTA 08/31/2016, 1:15 PM  [place], [place], [address] Phone: [phone]   Fax:  [phone]  Name: [name] MRN: [id number] Date of Birth: 1946/03/04

## 2016-09-02 ENCOUNTER — Encounter: Payer: Self-pay | Admitting: Physical Therapy

## 2016-09-02 ENCOUNTER — Ambulatory Visit: Payer: Medicare Other | Admitting: Physical Therapy

## 2016-09-02 DIAGNOSIS — M25561 Pain in right knee: Secondary | ICD-10-CM | POA: Diagnosis not present

## 2016-09-02 DIAGNOSIS — M6281 Muscle weakness (generalized): Secondary | ICD-10-CM

## 2016-09-02 DIAGNOSIS — M25662 Stiffness of left knee, not elsewhere classified: Secondary | ICD-10-CM

## 2016-09-02 DIAGNOSIS — M25661 Stiffness of right knee, not elsewhere classified: Secondary | ICD-10-CM

## 2016-09-02 DIAGNOSIS — R262 Difficulty in walking, not elsewhere classified: Secondary | ICD-10-CM

## 2016-09-02 NOTE — Therapy (Signed)
[place], [place], [address] Phone: [phone]   Fax:  [phone]  Physical Therapy Treatment/ Discharge   Patient Details  Name: [name] MRN: [id number] Date of Birth: 12/11/1945 Referring Provider: Sharol Given [facility]   Encounter Date: 09/02/2016      PT End of Session - 09/02/16 1523    Visit Number 10   Number of Visits 19   Date for PT Re-Evaluation 10/01/16   Authorization Type UHC MCR    PT Start Time 0930   PT Stop Time 1012   PT Time Calculation (min) 42 min   Activity Tolerance Patient tolerated treatment well   Behavior During Therapy [facility] for tasks assessed/performed      Past Medical History:  Diagnosis Date  . Hypertension     Past Surgical History:  Procedure Laterality Date  . MEDIAL PARTIAL KNEE REPLACEMENT Bilateral 06/07/12    There were no vitals filed for this visit.      Subjective Assessment - 09/02/16 0937    Subjective Patient reports her knees have not been painful. She continues to feel like she is having a little soreness in her left hip. It is getting better but it is still there. She has not been able to walk at the [facility] because it has been shut down.    Limitations Walking;Standing   How long can you stand comfortably? < 20 min    How long can you walk comfortably? limited community distances with pain    Currently in Pain? Yes   Pain Score 3    Pain Location Hip   Pain Orientation Left;Lateral   Pain Descriptors / Indicators Aching   Pain Type Chronic pain   Pain Onset 1 to 4 weeks ago   Pain Frequency Intermittent   Aggravating Factors  elliptical at the [facility]    Pain Relieving Factors rest    Effect of Pain on Daily Activities painfullwalking on hills, intermitent pain while walking             [facility] PT Assessment - 09/02/16 0001      AROM   Right Knee Flexion 118   Left Knee Flexion 112     Strength   Right Hip Flexion 4/5   Right Hip  ADduction 4+/5   Left Hip Flexion 3+/5   Left Hip ABduction 4+/5   Left Hip ADduction 5/5   Right Knee Flexion 5/5   Right Knee Extension 5/5   Left Knee Flexion 5/5   Left Knee Extension 5/5                             PT Education - 09/02/16 0943    Education provided Yes   Education Details continue with exercises and stretches at home. Improtance of hip stretching.    Person(s) Educated Patient   Methods Explanation;Demonstration   Comprehension Verbalized understanding;Returned demonstration;Verbal cues required          PT Short Term Goals - 09/02/16 1525      PT SHORT TERM GOAL #1   Title patient will increase bilateral left and right hip and knee strength to 4/5    Baseline 4+/5 gross left LE    Time 4   Period Weeks   Status Achieved     PT SHORT TERM GOAL #2   Title Patient will increase bilateral knee flexion to 5/5    Baseline 5/5 bilateral  Time 4   Period Weeks   Status Achieved     PT SHORT TERM GOAL #3   Title Patient will report 2/10 pain at worst in her R knee    Baseline No pain in her knee    Time 4   Period Weeks   Status Achieved     PT SHORT TERM GOAL #4   Title Patient will be independent with initial HEP    Baseline independent so far   Time 4   Period Weeks   Status Achieved           PT Long Term Goals - 09/02/16 1526      PT LONG TERM GOAL #1   Title Patient will stand for 1 hour without increased pain    Baseline can stand for close to an hour with no pain in her knees per patient    Time 8   Period Weeks   Status Achieved     PT LONG TERM GOAL #2   Title Patient will go up and down 4 steps without pain in order to improve her ability to ambulate in the community    Baseline going up and down stairs without difficulty    Time 8   Period Weeks   Status Achieved     PT LONG TERM GOAL #3   Title Patient will demsotrate 5/5 gross bilateral lower extremity strength in order to decrease her chance  of falls    Baseline continues to hjave some hip fleor weakness   Time 8   Period Weeks   Status Achieved     PT LONG TERM GOAL #4   Title Patient will score a 49% limitation on FOTO in order to show improved function   Baseline 28% limitation    Time 8   Period Weeks   Status Achieved               Plan - 09/02/16 1524    Clinical Impression Statement Patient feels comofrtable with ischarge. She continues to have some strength deficits and minor pain in her hip. Her knee motion has improved significantly. She is going to the [facility] and walking. She feels like she can continue her exercises on her own. D/C to HEP.    Rehab Potential Good   PT Frequency 2x / week   PT Duration 8 weeks   PT Treatment/Interventions ADLs/Self Care Home Management;Cryotherapy;Electrical Stimulation;Iontophoresis 68m/ml Dexamethasone;Therapeutic activities;Therapeutic exercise;Patient/family education;Moist Heat;Ultrasound;Gait training;Stair training;Manual techniques;Taping;Passive range of motion;Functional mobility training   PT Next Visit Plan ,  Hip and knee strengthening.    PT Home Exercise Plan supine march; quad set, patellar mob, heel slide with strap; straight leg raise ,  pre gait wall slides.   Consulted and Agree with Plan of Care Patient      Patient will benefit from skilled therapeutic intervention in order to improve the following deficits and impairments:  Abnormal gait, Decreased activity tolerance, Decreased strength, Decreased mobility, Pain, Increased muscle spasms, Decreased endurance, Difficulty walking, Decreased range of motion, Decreased safety awareness  Visit Diagnosis: Acute pain of right knee  Stiffness of right knee, not elsewhere classified  Stiffness of left knee, not elsewhere classified  Difficulty in walking, not elsewhere classified  Muscle weakness (generalized)  PHYSICAL THERAPY DISCHARGE SUMMARY  Visits from Start of Care: 10  Current functional  level related to goals / functional outcomes: Minor pain in her hip but going to the gym. No knee pain    Remaining deficits:  Minor hip pain    Education / Equipment: HEP  Plan: Patient agrees to discharge.  Patient goals were not met. Patient is being discharged due to being pleased with the current functional level.  ?????       Problem List Patient Active Problem List   Diagnosis Date Noted  . Bunion 08/21/2014  . 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 Onychomycosis 02/28/2013  . Pain, foot 02/28/2013    Carney Living PT DPT  09/02/2016, 3:30 PM  [facility] [address] [place], [place], [address] Phone: [phone]   Fax:  [phone]  Name: [name] MRN: [id number] Date of Birth: 1945/05/25

## 2016-09-07 ENCOUNTER — Ambulatory Visit: Payer: Medicare Other | Admitting: Physical Therapy

## 2016-09-09 ENCOUNTER — Ambulatory Visit: Payer: Medicare Other | Admitting: Physical Therapy

## 2016-11-02 ENCOUNTER — Ambulatory Visit (INDEPENDENT_AMBULATORY_CARE_PROVIDER_SITE_OTHER): Payer: Medicare Other | Admitting: Podiatry

## 2016-11-02 ENCOUNTER — Encounter: Payer: Self-pay | Admitting: Podiatry

## 2016-11-02 DIAGNOSIS — B351 Tinea unguium: Secondary | ICD-10-CM

## 2016-11-02 DIAGNOSIS — M79672 Pain in left foot: Secondary | ICD-10-CM

## 2016-11-02 DIAGNOSIS — L608 Other nail disorders: Secondary | ICD-10-CM

## 2016-11-02 DIAGNOSIS — M79671 Pain in right foot: Secondary | ICD-10-CM

## 2016-11-02 NOTE — Patient Instructions (Signed)
Seen for hypertrophic nails. Both big toe nails grinded. Return in 3 months or as needed.

## 2016-11-02 NOTE — Progress Notes (Signed)
Subjective:  [age]year old female presents requesting both great toe nails grinded rather than trimmed. They are thick and hurts in closed in shoes.  Objective:  Dermatologic:  Thick dystrophic nails on both great toe and 2nd left. Neurovascular status are within normal.  Orthopedic: No gross deformities.  Assessment: Painful deformed nail both great toes.  Mycotic nails both halluces and 2nd left. 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  Plan: All thick nails grinded down to thin on both feet. Reviewed findings and advised to return if pain or lesion returns.

## 2017-02-02 ENCOUNTER — Ambulatory Visit: Payer: Medicare Other | Admitting: Podiatry

## 2017-02-15 ENCOUNTER — Encounter: Payer: Self-pay | Admitting: Podiatry

## 2017-02-15 ENCOUNTER — Ambulatory Visit (INDEPENDENT_AMBULATORY_CARE_PROVIDER_SITE_OTHER): Payer: Medicare Other | Admitting: Podiatry

## 2017-02-15 DIAGNOSIS — L608 Other nail disorders: Secondary | ICD-10-CM

## 2017-02-15 DIAGNOSIS — B351 Tinea unguium: Secondary | ICD-10-CM

## 2017-02-15 DIAGNOSIS — M79671 Pain in right foot: Secondary | ICD-10-CM

## 2017-02-15 DIAGNOSIS — M79672 Pain in left foot: Secondary | ICD-10-CM | POA: Diagnosis not present

## 2017-02-15 NOTE — Progress Notes (Signed)
Subjective:  71year old female presents requesting all nails trimmed. Patient wants to know what can be done to soften the nail plate. Request both great toe nails grinded also. They are thick and hurts in closed in shoes.  Objective:  Dermatologic:  Thick dystrophic nails on both great toes. Hypertrophic nails x 10. Neurovascular status are within normal.  Orthopedic: No gross deformities.  Assessment: Painful deformed nail both great toes.  Mycotic nails x 10.Hammer toe 5th right.  Plan: All thick nails debrided and thick great toe nails grinded down to thin on both feet. Return in 3 months or sooner if needed.

## 2017-02-15 NOTE — Patient Instructions (Signed)
Seen for hypertrophic nails. All nails debrided and both great toe nails grinded.. Return in 3 months or as needed.

## 2017-05-18 ENCOUNTER — Encounter: Payer: Self-pay | Admitting: Podiatry

## 2017-05-18 ENCOUNTER — Ambulatory Visit (INDEPENDENT_AMBULATORY_CARE_PROVIDER_SITE_OTHER): Payer: Medicare Other | Admitting: Podiatry

## 2017-05-18 DIAGNOSIS — B351 Tinea unguium: Secondary | ICD-10-CM

## 2017-05-18 DIAGNOSIS — M79672 Pain in left foot: Secondary | ICD-10-CM | POA: Diagnosis not present

## 2017-05-18 DIAGNOSIS — M79671 Pain in right foot: Secondary | ICD-10-CM | POA: Diagnosis not present

## 2017-05-18 DIAGNOSIS — L608 Other nail disorders: Secondary | ICD-10-CM | POA: Diagnosis not present

## 2017-05-18 NOTE — Patient Instructions (Signed)
Seen for painful big toe nails. Affected nails grinded. Reviewed proper shoe gear to reduce excess friction on the tip of the big toes. Return in 3 months or sooner if needed.

## 2017-05-18 NOTE — Progress Notes (Signed)
Subjective: 72 y.o. year old female patient presents complaining of painful big toes and request for grinding down thick plates.  Objective: Dermatologic: Thick yellow deformed nails with pain inclosed in shoes. Hyperextended great toe with shiny redness at distal end of left great toe. Vascular: Pedal pulses are all palpable. Orthopedic: Hyperextended great toes. Neurologic: All epicritic and tactile sensations grossly intact.  Assessment: Dystrophic mycotic nail both great toes. Hyperextended great toe with pre ulcerative distal end.  Treatment: Dystrophic nail grinded on both big toes as per request. Proper shoe gear reviewed to prevent skin breakdown on tip of great toes. Return in 3 months or as needed.

## 2017-08-16 ENCOUNTER — Ambulatory Visit: Payer: Medicare Other | Admitting: Podiatry

## 2017-08-17 ENCOUNTER — Encounter: Payer: Self-pay | Admitting: Podiatry

## 2017-08-17 ENCOUNTER — Ambulatory Visit (INDEPENDENT_AMBULATORY_CARE_PROVIDER_SITE_OTHER): Payer: Medicare Other | Admitting: Podiatry

## 2017-08-17 DIAGNOSIS — B351 Tinea unguium: Secondary | ICD-10-CM

## 2017-08-17 DIAGNOSIS — M79671 Pain in right foot: Secondary | ICD-10-CM

## 2017-08-17 DIAGNOSIS — L608 Other nail disorders: Secondary | ICD-10-CM | POA: Diagnosis not present

## 2017-08-17 DIAGNOSIS — M79672 Pain in left foot: Secondary | ICD-10-CM

## 2017-08-17 NOTE — Progress Notes (Signed)
Subjective: 72 y.o. year old female patient presents complaining of pain on right great toe nail and request to be grinded.  Objective: Dermatologic: Thick yellow deformed nails both great toe nails. Right great toe nail plate is very thick and ingrown. No open skin or drainage noted. Vascular: Pedal pulses are all palpable. Orthopedic: No gross deformities. Neurologic: All epicritic and tactile sensations grossly intact.  Assessment: Dystrophic mycotic both great toe nails. Ingrown hallucal nail right great toe.  Treatment: Affected nails debrided and grinded. Return in 3 months or as needed.

## 2017-08-17 NOTE — Patient Instructions (Signed)
Seen for thick deformed nails. Affected nails grinded. Return in 3 month or as needed.

## 2017-11-16 ENCOUNTER — Encounter: Payer: Self-pay | Admitting: Podiatry

## 2017-11-16 ENCOUNTER — Ambulatory Visit (INDEPENDENT_AMBULATORY_CARE_PROVIDER_SITE_OTHER): Payer: Medicare Other | Admitting: Podiatry

## 2017-11-16 DIAGNOSIS — L608 Other nail disorders: Secondary | ICD-10-CM | POA: Diagnosis not present

## 2017-11-16 DIAGNOSIS — M79671 Pain in right foot: Secondary | ICD-10-CM | POA: Diagnosis not present

## 2017-11-16 DIAGNOSIS — M79672 Pain in left foot: Secondary | ICD-10-CM | POA: Diagnosis not present

## 2017-11-16 DIAGNOSIS — B351 Tinea unguium: Secondary | ICD-10-CM

## 2017-11-16 NOTE — Patient Instructions (Signed)
Seen for hypertrophic big toe nails. All both big toe nail debrided with electric grinder. Return in 3 months or as needed.

## 2017-11-16 NOTE — Progress Notes (Signed)
Subjective: 72 y.o. year old female patient presents complaining of painful big toe nails. Patient request thin them down.   Objective: Dermatologic: Thick yellow toe nail plate on both great toes painful. Vascular: Pedal pulses are all palpable. Orthopedic: No gross deformities. Neurologic: All epicritic and tactile sensations grossly intact.  Assessment: Dystrophic mycotic both great toe nails. Ingrown hallucal nail right great toe.  Treatment: Affected nails debrided and grinded. Return in 3 months or as needed.

## 2017-12-29 ENCOUNTER — Ambulatory Visit (INDEPENDENT_AMBULATORY_CARE_PROVIDER_SITE_OTHER): Payer: Self-pay

## 2017-12-29 ENCOUNTER — Encounter (INDEPENDENT_AMBULATORY_CARE_PROVIDER_SITE_OTHER): Payer: Self-pay | Admitting: Orthopaedic Surgery

## 2017-12-29 ENCOUNTER — Ambulatory Visit (INDEPENDENT_AMBULATORY_CARE_PROVIDER_SITE_OTHER): Payer: Medicare Other | Admitting: Orthopaedic Surgery

## 2017-12-29 ENCOUNTER — Other Ambulatory Visit (INDEPENDENT_AMBULATORY_CARE_PROVIDER_SITE_OTHER): Payer: Self-pay

## 2017-12-29 DIAGNOSIS — G8929 Other chronic pain: Secondary | ICD-10-CM

## 2017-12-29 DIAGNOSIS — M5440 Lumbago with sciatica, unspecified side: Secondary | ICD-10-CM

## 2017-12-29 DIAGNOSIS — M545 Low back pain: Principal | ICD-10-CM

## 2017-12-29 MED ORDER — METHOCARBAMOL 500 MG PO TABS: 500.0000 mg | ORAL_TABLET | Freq: Four times a day (QID) | ORAL | 1 refills | Status: DC | PRN

## 2017-12-29 MED ORDER — METHYLPREDNISOLONE 4 MG PO TABS: ORAL_TABLET | ORAL | 0 refills | Status: DC

## 2017-12-29 NOTE — Progress Notes (Signed)
Office Visit Note   Patient: [name]           Date of Birth: [birth year]           MRN: [id number] Visit Date: 12/29/2017              Requested by: Corine ShelterKilpatrick, George, MD [address] [place], [place] [address] PCP: Corine ShelterKilpatrick, George, MD   Assessment & Plan: Visit Diagnoses:  1. Acute bilateral low back pain with sciatica, sciatica laterality unspecified     Plan: I do feel that she would benefit from formal outpatient physical therapy to work on various modalities to decrease her back pain and improve her function.  I will also put her on a 6-day steroid taper and Robaxin as a muscle relaxant.  We will see her back in 4 weeks to see how she is doing overall.  Obviously if things worsen before then she will let us know.  Follow-Up Instructions: Return in about 4 weeks (around 01/26/2018).   Orders:  Orders Placed This Encounter  Procedures  . XR Lumbar Spine 2-3 Views   Meds ordered this encounter  Medications  . methylPREDNISolone (MEDROL) 4 MG tablet    Sig: Medrol dose pack. Take as instructed    Dispense:  21 tablet    Refill:  0  . methocarbamol (ROBAXIN) 500 MG tablet    Sig: Take 1 tablet (500 mg total) by mouth every 6 (six) hours as needed for muscle spasms.    Dispense:  60 tablet    Refill:  1      Procedures: No procedures performed   Clinical Data: No additional findings.   Subjective: Chief Complaint  Patient presents with  . Right Hip - Pain  The patient comes in with a month to two month history of right-sided low back pain with no radicular components.  She felt like she may have twisted her back wrong and hurt herself doing activities.  She denies any weakness in her legs or numbness and tingling.  Activity modification as well as anti-inflammatories and ice and heat have not lessen her symptoms.  She gets a catch in this area she states as well.  She denies any change in bowel bladder function and denies any groin or hip  pain.  HPI  Review of Systems She currently denies any headache, chest pain, shortness of breath, fever, chills, nausea, vomiting.  Objective: Vital Signs: There were no vitals taken for this visit.  Physical Exam She is alert and oriented x3 and in no acute distress Ortho Exam Examination of both hips and both lower extremities show normal muscle tone and normal range of motion of all joints.  She has good sensation in both feet and normal reflexes.  She does have pain in the lower aspect lumbar spine to the right side with flexion extension and palpation.  She has negative straight leg raise. Specialty Comments:  No specialty comments available.  Imaging: Xr Lumbar Spine 2-3 Views  Result Date: 12/29/2017 2 views of lumbar spine show no acute gross deformities.  There is well-maintained disc heights except for at L5-S1 with slight narrowing.  There is arthritic changes in the posterior elements at the lower lumbar spine.  There is slight loss of lumbar lordosis on lateral view    PMFS History: Patient Active Problem List   Diagnosis Date Noted  . Bunion 08/21/2014  . 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 Onychomycosis 02/28/2013  . Pain, foot 02/28/2013  Past Medical History:  Diagnosis Date  . Hypertension     History reviewed. No pertinent family history.  Past Surgical History:  Procedure Laterality Date  . 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 Not on file  Tobacco Use  . Smoking status: Never Smoker  . Smokeless tobacco: Never Used  Substance and Sexual Activity  . Alcohol use: No    Alcohol/week: 0.0 standard drinks  . Drug use: No  . Sexual activity: Never    Birth control/protection: Surgical    Comment: hysterectomy

## 2017-12-30 ENCOUNTER — Telehealth (INDEPENDENT_AMBULATORY_CARE_PROVIDER_SITE_OTHER): Payer: Self-pay

## 2017-12-30 NOTE — Telephone Encounter (Signed)
The other muscle relaxers will make her too sleepy. So now muscle relaxer.  She did not want anything that would make her impaired.

## 2017-12-30 NOTE — Telephone Encounter (Signed)
Can we call her in a different muscle relaxer other than Robaxin? Her insurance will not cover Robaxin

## 2018-01-10 ENCOUNTER — Encounter: Payer: Self-pay | Admitting: Physical Therapy

## 2018-01-10 ENCOUNTER — Ambulatory Visit: Payer: Medicare Other | Attending: Orthopaedic Surgery | Admitting: Physical Therapy

## 2018-01-10 DIAGNOSIS — M6281 Muscle weakness (generalized): Secondary | ICD-10-CM

## 2018-01-10 DIAGNOSIS — M545 Low back pain, unspecified: Secondary | ICD-10-CM

## 2018-01-10 NOTE — Therapy (Signed)
[facility] [address] [place], [place], [address] Phone: [phone]   Fax:  [phone]  Physical Therapy Evaluation  Patient Details  Name: [name] MRN: [id number] Date of Birth: 03/14/1946 Referring Provider: Magnus IvanBlackman   Encounter Date: 01/10/2018  PT End of Session - 01/10/18 0901    Visit Number  1    Number of Visits  12    Date for PT Re-Evaluation  02/21/18    PT Start Time  0810    PT Stop Time  0855    PT Time Calculation (min)  45 min    Activity Tolerance  Patient limited by pain    Behavior During Therapy  [facility] for [facility] assessed/performed       Past Medical History:  Diagnosis Date  . Hypertension     Past Surgical History:  Procedure Laterality Date  . MEDIAL PARTIAL KNEE REPLACEMENT Bilateral 06/07/12    There were no vitals filed for this visit.   Subjective Assessment - 01/10/18 0849    Subjective  Pt relays about 6 weeks ago she was trying to steady a lawnmower and has had LBP and Rt posterior hip pain since. She got a little releif with prednizone but then the pain came back. She is now referred to PT    Pertinent History  no previous back injuries, bilat partial TKA    How long can you sit comfortably?  5 min    How long can you stand comfortably?  5 min    Diagnostic tests  lumbar x-ray: no acute gross deformities. There is well-maintained disc heights except for at L5-S1 with slight narrowing. There is arthritic changes in the posterior elements at the lower lumbar     Patient Stated Goals  get my back normal again    Currently in Pain?  Yes    Pain Score  6     Pain Location  Back    Pain Orientation  Right;Lower    Pain Descriptors / Indicators  Aching;Sharp    Pain Type  Acute pain    Pain Radiating Towards  Rt posterior hip    Pain Onset  More than a month ago    Pain Frequency  Constant    Aggravating Factors   bending back, twisting, lifting, sitting or laying too long     Pain Relieving Factors  heat         [facility] PT Assessment - 01/10/18 0001      Assessment   Medical Diagnosis  LBP with Rt hip pain    Referring Provider  Magnus IvanBlackman    Onset Date/Surgical Date  --   6 weeks   Next MD Visit  01/21/18    Prior Therapy  none      Precautions   Precautions  None      Balance Screen   Has the patient fallen in the past 6 months  No      Home Environment   Living Environment  Private residence    Additional Comments  2 steps to enter, no difficulty with this      Prior Function   Level of Independence  Independent with basic ADLs   needs some assistance with cleaning house   Vocation  Retired      IT consultantCognition   Overall Cognitive Status  Within Functional Limits for tasks assessed      Observation/Other Assessments   Focus on Therapeutic Outcomes (FOTO)   48% limited  ROM / Strength   AROM / PROM / Strength  AROM;Strength      AROM   AROM Assessment Site  Lumbar    Lumbar Flexion  WFL    Lumbar Extension  10% pain on Rt    Lumbar - Right Side Bend  50%    Lumbar - Left Side Bend  50% pain on Rt    Lumbar - Right Rotation  50% pain on Rt    Lumbar - Left Rotation  75%      Strength   Overall Strength Comments  Rt LE 4/5 and Lt LE 4+/5 grossly      Special Tests   Other special tests  +quadrant, SLR, FABERs, SI jt cluster, negative slump      Transfers   Comments  pt needed min A for supine to sit                Objective measurements completed on examination: See above findings.      [facility] Adult PT Treatment/Exercise - 01/10/18 0001      Modalities   Modalities  Electrical Stimulation;Moist Heat      Moist Heat Therapy   Number Minutes Moist Heat  10 Minutes    Moist Heat Location  Lumbar Spine      Electrical Stimulation   Electrical Stimulation Location  lumbar/Rt post hip    Electrical Stimulation Action  IFC 10 min    Electrical Stimulation Parameters  tolerance    Electrical Stimulation Goals  Pain              PT Education - 01/10/18 0900    Education Details  HEP, POC, TENS    Person(s) Educated  Patient    Methods  Explanation;Demonstration;Verbal cues;Handout    Comprehension  Verbalized understanding;Need further instruction          PT Long Term Goals - 01/10/18 0908      PT LONG TERM GOAL #1   Title  Pt will report less than 3/10 overall pain with activity. 6 weeks 02/20/18    Status  New      PT LONG TERM GOAL #2   Title  Pt will improve lumbar AROM to [facility]. 6 weeks 02/20/18    Status  New      PT LONG TERM GOAL #3   Title  Patient will demsotrate 4+ t0 5-/5 gross bilateral lower extremity strength. 6 weeks 02/20/18    Status  New      PT LONG TERM GOAL #4   Title  Patient will score a 36% limitation on FOTO in order to show improved function. 6 weeks 02/20/18    Status  New             Plan - 01/10/18 0902    Clinical Impression Statement  Pt presents with acute LBP for last 6 weeks. Special tests suggest disc pathology, facet arthropathy, Rt sided strain, and SI joint dysfunction. She is very TTP in Rt lumbar and posterior hip/SI joint making spinal mobility difficult to assess. She denies radiculopathy. She does have slight weakness in Rt LE with decreased lumbar ROM, decreasesd core strength, poor posture and increased pain limiting her full funciton. She will benefit from skilled PT to address these deficits.    Clinical Presentation  Evolving    Clinical Presentation due to:  high irritability of symptoms    Clinical Decision Making  Moderate    Rehab Potential  Good    Clinical Impairments  Affecting Rehab Potential  pt forgetful of short term instructions    PT Frequency  2x / week    PT Duration  6 weeks    PT Treatment/Interventions  ADLs/Self Care Home Management;Cryotherapy;Electrical Stimulation;Iontophoresis 4mg /ml Dexamethasone;Moist Heat;Traction;Ultrasound;Therapeutic exercise;Neuromuscular re-education;Manual techniques;Passive range of  motion;Dry needling;Taping    PT Next Visit Plan  review HEP, stretching and core, MT and modalties PRN, assess posture further for lateral shift?    Consulted and Agree with Plan of Care  Patient       Patient will benefit from skilled therapeutic intervention in order to improve the following deficits and impairments:  Abnormal gait, Decreased activity tolerance, Decreased range of motion, Decreased strength, Obesity, Postural dysfunction, Pain, Increased muscle spasms  Visit Diagnosis: Acute right-sided low back pain without sciatica  Muscle weakness (generalized)     Problem List Patient Active Problem List   Diagnosis Date Noted  . Bunion 08/21/2014  . 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 Onychomycosis 02/28/2013  . Pain, foot 02/28/2013    April Manson, PT, DPT 01/10/2018, 9:15 AM  [facility] [address] [place], [place], [address] Phone: [phone]   Fax:  [phone]  Name: [name] MRN: [id number] Date of Birth: Nov 08, 1945

## 2018-01-10 NOTE — Patient Instructions (Addendum)

## 2018-01-12 ENCOUNTER — Encounter: Payer: Self-pay | Admitting: Physical Therapy

## 2018-01-12 ENCOUNTER — Ambulatory Visit: Payer: Medicare Other | Admitting: Physical Therapy

## 2018-01-12 DIAGNOSIS — M6281 Muscle weakness (generalized): Secondary | ICD-10-CM

## 2018-01-12 DIAGNOSIS — M545 Low back pain, unspecified: Secondary | ICD-10-CM

## 2018-01-12 NOTE — Therapy (Signed)
[facility] [address] [place], [place], [address] Phone: [phone]   Fax:  [phone]  Physical Therapy Treatment  Patient Details  Name: [name] MRN: [id number] Date of Birth: Feb 10, 1946 Referring Provider: Magnus Ivan   Encounter Date: 01/12/2018  PT End of Session - 01/12/18 1022    Visit Number  2    Number of Visits  12    Date for PT Re-Evaluation  02/21/18    PT Start Time  1016    PT Stop Time  1109    PT Time Calculation (min)  53 min    Activity Tolerance  Patient tolerated treatment well    Behavior During Therapy  [facility] for tasks assessed/performed       Past Medical History:  Diagnosis Date  . Hypertension     Past Surgical History:  Procedure Laterality Date  . MEDIAL PARTIAL KNEE REPLACEMENT Bilateral 06/07/12    There were no vitals filed for this visit.  Subjective Assessment - 01/12/18 1019    Subjective  No pain today.  I didnt like the electric therapy but it helped.  I may not need to come back.     Currently in Pain?  No/denies             [facility] Adult PT Treatment/Exercise - 01/12/18 0001      Self-Care   Self-Care  Lifting;Posture;Heat/Ice Application;Other Self-Care Comments      Lumbar Exercises: Stretches   Active Hamstring Stretch  2 reps;30 seconds    Lower Trunk Rotation  5 reps;10 seconds    Piriformis Stretch  2 reps;30 seconds    Piriformis Stretch Limitations  knee to opp shoulder       Lumbar Exercises: Aerobic   Nustep  5 min L5 UE and LE       Lumbar Exercises: Supine   Ab Set  5 reps    Clam  10 reps    Clam Limitations  bi and unlateral mod cues for abdominal draw in     Bridge with Ball Squeeze  10 reps    Straight Leg Raise  10 reps                  PT Long Term Goals - 01/10/18 0908      PT LONG TERM GOAL #1   Title  Pt will report less than 3/10 overall pain with activity. 6 weeks 02/20/18    Status  New      PT LONG TERM GOAL #2    Title  Pt will improve lumbar AROM to [facility]. 6 weeks 02/20/18    Status  New      PT LONG TERM GOAL #3   Title  Patient will demsotrate 4+ t0 5-/5 gross bilateral lower extremity strength. 6 weeks 02/20/18    Status  New      PT LONG TERM GOAL #4   Title  Patient will score a 36% limitation on FOTO in order to show improved function. 6 weeks 02/20/18    Status  New            Plan - 01/12/18 1034    Clinical Impression Statement  Patietnt entered clinic thinking she would be done with PT after today.  After the session, I think she realizes she may need reinforcement with posture, body mechanics and safe lifting.  Weak core noted with basci stab exercises.  Did not add on due to current number of stretches.  No pain post.  Did not repeat IFC as she did not like the way it felt despite it helping her pain.     PT Treatment/Interventions  ADLs/Self Care Home Management;Cryotherapy;Electrical Stimulation;Iontophoresis 4mg /ml Dexamethasone;Moist Heat;Traction;Ultrasound;Therapeutic exercise;Neuromuscular re-education;Manual techniques;Passive range of motion;Dry needling;Taping    PT Next Visit Plan  review HEP, stretching and core, MT and modalties PRN, assess posture further for lateral shift?    Consulted and Agree with Plan of Care  Patient       Patient will benefit from skilled therapeutic intervention in order to improve the following deficits and impairments:  Abnormal gait, Decreased activity tolerance, Decreased range of motion, Decreased strength, Obesity, Postural dysfunction, Pain, Increased muscle spasms  Visit Diagnosis: Acute right-sided low back pain without sciatica  Muscle weakness (generalized)     Problem List Patient Active Problem List   Diagnosis Date Noted  . Bunion 08/21/2014  . 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 Onychomycosis 02/28/2013  . Pain, foot 02/28/2013    [name] 01/12/2018, 11:46 AM  [facility] [address] [place], [place], [address] Phone: [phone]   Fax:  [phone]  Name: Vanessa RisenGail L Couchman MRN: [id number] Date of Birth: Apr 23, 1946  Karie MainlandJennifer Anagha Loseke, PT 01/12/18 11:46 AM Phone: [phone] Fax: [phone]

## 2018-01-18 ENCOUNTER — Ambulatory Visit: Payer: Medicare Other | Attending: Orthopaedic Surgery | Admitting: Physical Therapy

## 2018-01-18 DIAGNOSIS — M6281 Muscle weakness (generalized): Secondary | ICD-10-CM | POA: Diagnosis present

## 2018-01-18 DIAGNOSIS — M545 Low back pain, unspecified: Secondary | ICD-10-CM

## 2018-01-18 NOTE — Therapy (Signed)
[place], [place], [address] Phone: [phone]   Fax:  [phone]  Physical Therapy Treatment/Discharge  Patient Details  Name: [name] MRN: [id number] Date of Birth: 1946/04/03 Referring Provider: Ninfa Linden   Encounter Date: 01/18/2018  PT End of Session - 01/18/18 1137    Visit Number  3    Number of Visits  12    Date for PT Re-Evaluation  02/21/18    PT Start Time  1105    PT Stop Time  1145   last 10 min on heat   PT Time Calculation (min)  40 min    Activity Tolerance  Patient tolerated treatment well    Behavior During Therapy  [facility] for tasks assessed/performed       Past Medical History:  Diagnosis Date  . Hypertension     Past Surgical History:  Procedure Laterality Date  . MEDIAL PARTIAL KNEE REPLACEMENT Bilateral 06/07/12    There were no vitals filed for this visit.  Subjective Assessment - 01/18/18 1120    Subjective  Pt relays today might be her last day as she is feeling much better. She relays overall less than 3/10 with any activity.    Currently in Pain?  Yes    Pain Score  1     Pain Location  Back    Pain Descriptors / Indicators  Aching         [facility] PT Assessment - 01/18/18 0001      Assessment   Medical Diagnosis  LBP with Rt hip pain    Referring Provider  Ninfa Linden    Next MD Visit  01/21/18    Prior Therapy  none      Observation/Other Assessments   Focus on Therapeutic Outcomes (FOTO)   25% limited      AROM   Lumbar Flexion  WFL    Lumbar Extension  WFL    Lumbar - Right Side Bend  [facility]    Lumbar - Left Side Bend  WFL    Lumbar - Right Rotation  WFL    Lumbar - Left Rotation  [facility]      Strength   Overall Strength Comments  LE strength -5 to 5/5 bilat                   [facility] Adult PT Treatment/Exercise - 01/18/18 0001      Exercises   Exercises  Lumbar      Lumbar Exercises: Stretches   Active Hamstring Stretch  2 reps;30  seconds    Lower Trunk Rotation  5 reps;10 seconds    Piriformis Stretch  2 reps;30 seconds      Lumbar Exercises: Aerobic   Stationary Bike  5 min      Lumbar Exercises: Supine   Ab Set  10 reps    Clam  10 reps    Clam Limitations  unilat, both    Bridge with [employer]  10 reps    Straight Leg Raise  10 reps      Modalities   Modalities  Moist Heat      Moist Heat Therapy   Number Minutes Moist Heat  10 Minutes    Moist Heat Location  Lumbar Spine             PT Education - 01/18/18 1137    Education Details  DC edu    Person(s) Educated  Patient    Methods  Explanation;Verbal  cues    Comprehension  Verbalized understanding          PT Long Term Goals - 01/18/18 1130      PT LONG TERM GOAL #1   Title  Pt will report less than 3/10 overall pain with activity. 6 weeks 02/20/18    Time  8    Period  Weeks    Status  Achieved      PT LONG TERM GOAL #2   Title  Pt will improve lumbar AROM to [facility]. 6 weeks 02/20/18    Time  8    Period  Weeks    Status  Achieved      PT LONG TERM GOAL #3   Title  Patient will demsotrate 4+ t0 5-/5 gross bilateral lower extremity strength. 6 weeks 02/20/18    Time  8    Period  Weeks    Status  Achieved      PT LONG TERM GOAL #4   Title  Patient will score a 36% limitation on FOTO in order to show improved function. 6 weeks 02/20/18    Period  Weeks    Status  Achieved            Plan - 01/18/18 1137    Clinical Impression Statement  Pt has made excellent progress with strenght, ROM, and pain levels. She has met all PT goals today and she will be D/C to HEP due to progress. She had no further questions or concerns about D/C and feels confident to transition into HEP only.    Rehab Potential  Excellent    PT Treatment/Interventions  ADLs/Self Care Home Management;Cryotherapy;Electrical Stimulation;Iontophoresis 103m/ml Dexamethasone;Moist Heat;Traction;Ultrasound;Therapeutic exercise;Neuromuscular re-education;Manual  techniques;Passive range of motion;Dry needling;Taping    PT Next Visit Plan  D/C this visit       Patient will benefit from skilled therapeutic intervention in order to improve the following deficits and impairments:  Abnormal gait, Decreased activity tolerance, Decreased range of motion, Decreased strength, Obesity, Postural dysfunction, Pain, Increased muscle spasms  Visit Diagnosis: Acute right-sided low back pain without sciatica  Muscle weakness (generalized)     Problem List Patient Active Problem List   Diagnosis Date Noted  . Bunion 08/21/2014  . 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 Onychomycosis 02/28/2013  . Pain, foot 02/28/2013    BDebbe Odea PT, DPT 01/18/2018, 11:40 AM  [facility] [address]Phone: [phone]  Fax:  [phone] Name: Vanessa FORDMRN: [id number] of Birth: [birth year] PHYSICAL THERAPY DISCHARGE SUMMARY  Visits from Start of Care: 3  Current functional level related to goals / functional outcomes: Only 25% limit with FOTO   Remaining deficits: Mild core strength deficits   Education / Equipment: D/C to HEP with HEP review provided today Plan: Patient agrees to discharge.  Patient goals were met. Patient is being discharged due to meeting the stated rehab goals.  ?????     BElsie Ra PT, DPT 01/18/18 11:41 AM

## 2018-01-24 ENCOUNTER — Ambulatory Visit: Payer: Medicare Other | Admitting: Physical Therapy

## 2018-01-25 ENCOUNTER — Ambulatory Visit (INDEPENDENT_AMBULATORY_CARE_PROVIDER_SITE_OTHER): Payer: Medicare Other | Admitting: Orthopaedic Surgery

## 2018-01-26 ENCOUNTER — Ambulatory Visit: Payer: Medicare Other | Admitting: Physical Therapy

## 2018-01-30 ENCOUNTER — Ambulatory Visit: Payer: Medicare Other | Admitting: Physical Therapy

## 2018-02-01 ENCOUNTER — Encounter: Payer: Self-pay | Admitting: Physical Therapy

## 2018-02-20 ENCOUNTER — Ambulatory Visit: Payer: Medicare Other | Admitting: Podiatry

## 2018-06-10 ENCOUNTER — Other Ambulatory Visit: Payer: Self-pay

## 2018-06-10 ENCOUNTER — Encounter (HOSPITAL_COMMUNITY): Payer: Self-pay | Admitting: Emergency Medicine

## 2018-06-10 ENCOUNTER — Ambulatory Visit (HOSPITAL_COMMUNITY)
Admission: EM | Admit: 2018-06-10 | Discharge: 2018-06-10 | Disposition: A | Discharge status: Home / Self Care | Payer: Medicare Other | Attending: Radiology | Admitting: Radiology

## 2018-06-10 ENCOUNTER — Telehealth (HOSPITAL_COMMUNITY): Payer: Self-pay

## 2018-06-10 DIAGNOSIS — R591 Generalized enlarged lymph nodes: Secondary | ICD-10-CM | POA: Insufficient documentation

## 2018-06-10 MED ORDER — CLINDAMYCIN HCL 150 MG PO CAPS: 150.0000 mg | ORAL_CAPSULE | Freq: Four times a day (QID) | ORAL | 0 refills | Status: DC

## 2018-06-10 NOTE — ED Provider Notes (Addendum)
[facility]    CSN: [id number] Arrival date & time: 06/10/18  1213     History   Chief Complaint Chief Complaint  Patient presents with  . Lymphadenopathy    HPI [name] is a 73 y.o. female.   73 year old female presents with adenopathy x5 days.  Patient describes swollen lymph nodes to the deep cervical right aspect of her neck with radiation under her tongue to her right ear and around the base of her skull.  Patient reports mild tenderness with palpation.  She describes as a "shooting" sensation. Patient denies any fevers night sweats or weight loss.  No visible rashes noted.  Patient denies any dental trauma but states that she may have a broken tooth.  Patient acute in nature.  Condition is made worse by nothing.  Condition is made better by nothing.  Patient denies any treatment prior to arrival at this facility.  No neurological deficits noted     Past Medical History:  Diagnosis Date  . Hypertension     Patient Active Problem List   Diagnosis Date Noted  . Bunion 08/21/2014  . 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 Onychomycosis 02/28/2013  . Pain, foot 02/28/2013    Past Surgical History:  Procedure Laterality Date  . MEDIAL PARTIAL KNEE REPLACEMENT Bilateral 06/07/12    OB History   No obstetric history on file.      Home Medications    Prior to Admission medications   Medication Sig Start Date End Date Taking? Authorizing Provider  aspirin EC 81 MG tablet Take 81 mg by mouth daily.    [provider]  celecoxib (CELEBREX) 50 MG capsule Take 50 mg by mouth 2 (two) times daily.    [provider]  Diethylpropion HCl CR 75 MG TB24 Take 1 tablet by mouth daily. Reported on 06/03/2015 07/17/14   [provider]  ibuprofen (ADVIL,MOTRIN) 600 MG tablet Take 1 tablet (600 mg total) by mouth every 6 (six) hours as needed. 03/24/13   Ozella Rocks, MD  losartan-hydrochlorothiazide (HYZAAR) 50-12.5 MG per tablet Take  1 tablet by mouth daily.    [provider]  methocarbamol (ROBAXIN) 500 MG tablet Take 1 tablet (500 mg total) by mouth every 6 (six) hours as needed for muscle spasms. 12/29/17   Kathryne Hitch, MD  omeprazole (PRILOSEC) 40 MG capsule  08/01/14   [provider]  ranitidine (ZANTAC) 150 MG capsule Take 150 mg by mouth 2 (two) times daily. Reported on 06/03/2015    [provider]  rosuvastatin (CRESTOR) 5 MG tablet Take 5 mg by mouth daily.    [provider]  traMADol (ULTRAM) 50 MG tablet Take 1 tablet (50 mg total) by mouth every 6 (six) hours as needed. 03/24/13   Ozella Rocks, MD    Family History History reviewed. No pertinent family history.  Social History Social History   Tobacco Use  . Smoking status: Never Smoker  . Smokeless tobacco: Never Used  Substance Use Topics  . Alcohol use: No    Alcohol/week: 0.0 standard drinks  . Drug use: No     Allergies   Shellfish-derived products; Codeine; Tetracyclines & related; and Vibramycin [doxycycline calcium]   Review of Systems Review of Systems  Constitutional: Negative for chills and fever.  HENT: Negative for ear pain ( right).        Swollen lymph nodes to right side of nec  Eyes: Negative for pain and visual disturbance.  Respiratory: Negative for cough and shortness of breath.   Cardiovascular: Negative for chest pain and palpitations.  Gastrointestinal: Negative for abdominal pain and vomiting.  Genitourinary: Negative for dysuria and hematuria.  Musculoskeletal: Negative for arthralgias and back pain.  Skin: Negative for color change and rash.  Neurological: Negative for seizures and syncope.  All other systems reviewed and are negative.    Physical Exam Triage Vital Signs ED Triage Vitals  Enc Vitals Group     BP 06/10/18 1342 (!) 147/88     Pulse Rate 06/10/18 1342 75     Resp 06/10/18 1342 18     Temp 06/10/18 1342 97.8 F (36.6 C)     Temp Source  06/10/18 1342 Oral     SpO2 06/10/18 1342 99 %     Weight --      Height --      Head Circumference --      Peak Flow --      Pain Score 06/10/18 1341 9     Pain Loc --      Pain Edu? --      Excl. in GC? --    No data found.  Updated Vital Signs BP (!) 147/88 (BP Location: Right Arm)   Pulse 75   Temp 97.8 F (36.6 C) (Oral)   Resp 18   SpO2 99%   Visual Acuity Right Eye Distance:   Left Eye Distance:   Bilateral Distance:    Right Eye Near:   Left Eye Near:    Bilateral Near:     Physical Exam Vitals signs and nursing note reviewed.  Constitutional:      Appearance: She is well-developed.  HENT:     Head: Normocephalic and atraumatic.     Right Ear: Tympanic membrane normal.  Eyes:     Conjunctiva/sclera: Conjunctivae normal.  Neck:     Musculoskeletal: Normal range of motion.  Pulmonary:     Effort: Pulmonary effort is normal.  Musculoskeletal: Normal range of motion.  Skin:    General: Skin is warm.  Neurological:     Mental Status: She is alert and oriented to person, place, and time.      UC Treatments / Results  Labs (all labs ordered are listed, but only abnormal results are displayed) Labs Reviewed - No data to display  EKG None  Radiology No results found.  Procedures Procedures (including critical care time)  Medications Ordered in UC Medications - No data to display  Initial Impression / Assessment and Plan / UC Course  I have reviewed the triage vital signs and the nursing notes.  Pertinent labs & imaging results that were available during my care of the patient were reviewed by me and considered in my medical decision making (see chart for details).      Final Clinical Impressions(s) / UC Diagnoses   Final diagnoses:  None   Discharge Instructions   None    ED Prescriptions    None     Controlled Substance Prescriptions [place] Controlled Substance Registry consulted? Not Applicable   Alene MiresOmohundro, Cassiel Fernandez C,  NP 06/10/18 1442    Alene Miresmohundro, Saharra Santo C, NP 06/10/18 1446    Alene Miresmohundro, Tia Gelb C, NP 06/10/18 [phone]

## 2018-06-10 NOTE — ED Triage Notes (Signed)
The patient presented to the [facility] with a complaint of possible swollen lymph nodes on the right side of her neck that are causing pain in her throat and right ear x 5 days.

## 2018-08-31 ENCOUNTER — Other Ambulatory Visit: Payer: Self-pay | Admitting: Pulmonary Disease

## 2019-02-25 ENCOUNTER — Emergency Department (HOSPITAL_COMMUNITY): Payer: Medicare Other

## 2019-02-25 ENCOUNTER — Other Ambulatory Visit: Payer: Self-pay

## 2019-02-25 ENCOUNTER — Inpatient Hospital Stay (HOSPITAL_COMMUNITY)
Admission: EM | Admit: 2019-02-25 | Discharge: 2019-03-07 | DRG: 177 | Disposition: A | Discharge status: Home / Self Care | Payer: Medicare Other | Attending: Family Medicine | Admitting: Family Medicine

## 2019-02-25 DIAGNOSIS — E876 Hypokalemia: Secondary | ICD-10-CM | POA: Diagnosis present

## 2019-02-25 DIAGNOSIS — U071 COVID-19: Principal | ICD-10-CM | POA: Diagnosis present

## 2019-02-25 DIAGNOSIS — E785 Hyperlipidemia, unspecified: Secondary | ICD-10-CM

## 2019-02-25 DIAGNOSIS — Z91018 Allergy to other foods: Secondary | ICD-10-CM

## 2019-02-25 DIAGNOSIS — N179 Acute kidney failure, unspecified: Secondary | ICD-10-CM | POA: Diagnosis present

## 2019-02-25 DIAGNOSIS — Z881 Allergy status to other antibiotic agents status: Secondary | ICD-10-CM

## 2019-02-25 DIAGNOSIS — Z7982 Long term (current) use of aspirin: Secondary | ICD-10-CM

## 2019-02-25 DIAGNOSIS — Z885 Allergy status to narcotic agent status: Secondary | ICD-10-CM

## 2019-02-25 DIAGNOSIS — G9341 Metabolic encephalopathy: Secondary | ICD-10-CM | POA: Diagnosis present

## 2019-02-25 DIAGNOSIS — Z791 Long term (current) use of non-steroidal anti-inflammatories (NSAID): Secondary | ICD-10-CM

## 2019-02-25 DIAGNOSIS — Z9181 History of falling: Secondary | ICD-10-CM

## 2019-02-25 DIAGNOSIS — Z96653 Presence of artificial knee joint, bilateral: Secondary | ICD-10-CM | POA: Diagnosis present

## 2019-02-25 DIAGNOSIS — J9690 Respiratory failure, unspecified, unspecified whether with hypoxia or hypercapnia: Secondary | ICD-10-CM

## 2019-02-25 DIAGNOSIS — E669 Obesity, unspecified: Secondary | ICD-10-CM | POA: Diagnosis present

## 2019-02-25 DIAGNOSIS — Z79899 Other long term (current) drug therapy: Secondary | ICD-10-CM

## 2019-02-25 DIAGNOSIS — R17 Unspecified jaundice: Secondary | ICD-10-CM | POA: Diagnosis present

## 2019-02-25 DIAGNOSIS — K219 Gastro-esophageal reflux disease without esophagitis: Secondary | ICD-10-CM

## 2019-02-25 DIAGNOSIS — J9601 Acute respiratory failure with hypoxia: Secondary | ICD-10-CM | POA: Diagnosis not present

## 2019-02-25 DIAGNOSIS — I10 Essential (primary) hypertension: Secondary | ICD-10-CM

## 2019-02-25 DIAGNOSIS — E860 Dehydration: Secondary | ICD-10-CM | POA: Diagnosis present

## 2019-02-25 DIAGNOSIS — J1282 Pneumonia due to coronavirus disease 2019: Secondary | ICD-10-CM

## 2019-02-25 DIAGNOSIS — Z79891 Long term (current) use of opiate analgesic: Secondary | ICD-10-CM

## 2019-02-25 DIAGNOSIS — J1289 Other viral pneumonia: Secondary | ICD-10-CM | POA: Diagnosis present

## 2019-02-25 LAB — CBC WITH DIFFERENTIAL/PLATELET
Abs Immature Granulocytes: 0.09 10*3/uL — ABNORMAL HIGH (ref 0.00–0.07)
Basophils Absolute: 0 10*3/uL (ref 0.0–0.1)
Basophils Relative: 0 %
Eosinophils Absolute: 0 10*3/uL (ref 0.0–0.5)
Eosinophils Relative: 0 %
HCT: 38.5 % (ref 36.0–46.0)
Hemoglobin: 11.8 g/dL — ABNORMAL LOW (ref 12.0–15.0)
Immature Granulocytes: 1 %
Lymphocytes Relative: 13 %
Lymphs Abs: 1.2 10*3/uL (ref 0.7–4.0)
MCH: 26.3 pg (ref 26.0–34.0)
MCHC: 30.6 g/dL (ref 30.0–36.0)
MCV: 85.9 fL (ref 80.0–100.0)
Monocytes Absolute: 0.4 10*3/uL (ref 0.1–1.0)
Monocytes Relative: 4 %
Neutro Abs: 7.4 10*3/uL (ref 1.7–7.7)
Neutrophils Relative %: 82 %
Platelets: 379 10*3/uL (ref 150–400)
RBC: 4.48 MIL/uL (ref 3.87–5.11)
RDW: 14.6 % (ref 11.5–15.5)
WBC: 9.1 10*3/uL (ref 4.0–10.5)
nRBC: 0 % (ref 0.0–0.2)

## 2019-02-25 LAB — COMPREHENSIVE METABOLIC PANEL
ALT: 32 U/L (ref 0–44)
AST: 55 U/L — ABNORMAL HIGH (ref 15–41)
Albumin: 2.4 g/dL — ABNORMAL LOW (ref 3.5–5.0)
Alkaline Phosphatase: 57 U/L (ref 38–126)
Anion gap: 15 (ref 5–15)
BUN: 11 mg/dL (ref 8–23)
CO2: 22 mmol/L (ref 22–32)
Calcium: 7.7 mg/dL — ABNORMAL LOW (ref 8.9–10.3)
Chloride: 95 mmol/L — ABNORMAL LOW (ref 98–111)
Creatinine, Ser: 1.02 mg/dL — ABNORMAL HIGH (ref 0.44–1.00)
GFR calc Af Amer: >60 mL/min (ref >60)
GFR calc non Af Amer: 54 mL/min — ABNORMAL LOW (ref >60)
Glucose, Bld: 130 mg/dL — ABNORMAL HIGH (ref 70–99)
Potassium: 4.6 mmol/L (ref 3.5–5.1)
Sodium: 132 mmol/L — ABNORMAL LOW (ref 135–145)
Total Bilirubin: 2.3 mg/dL — ABNORMAL HIGH (ref 0.3–1.2)
Total Protein: 6.5 g/dL (ref 6.5–8.1)

## 2019-02-25 LAB — C-REACTIVE PROTEIN: CRP: 24.7 mg/dL — ABNORMAL HIGH (ref <1.0)

## 2019-02-25 LAB — TROPONIN I (HIGH SENSITIVITY): Troponin I (High Sensitivity): 11 ng/L (ref <18)

## 2019-02-25 LAB — FIBRINOGEN: Fibrinogen: 748 mg/dL — ABNORMAL HIGH (ref 210–475)

## 2019-02-25 LAB — LACTIC ACID, PLASMA
Lactic Acid, Venous: 2.3 mmol/L (ref 0.5–1.9)
Lactic Acid, Venous: 1.4 mmol/L (ref 0.5–1.9)

## 2019-02-25 LAB — LACTATE DEHYDROGENASE: LDH: 610 U/L — ABNORMAL HIGH (ref 98–192)

## 2019-02-25 LAB — D-DIMER, QUANTITATIVE: D-Dimer, Quant: 1.3 ug/mL-FEU — ABNORMAL HIGH (ref 0.00–0.50)

## 2019-02-25 LAB — SARS CORONAVIRUS 2 BY RT PCR (HOSPITAL ORDER, PERFORMED IN ~~LOC~~ HOSPITAL LAB): SARS Coronavirus 2: POSITIVE — AB

## 2019-02-25 LAB — TRIGLYCERIDES: Triglycerides: 107 mg/dL (ref <150)

## 2019-02-25 LAB — FERRITIN: Ferritin: 238 ng/mL (ref 11–307)

## 2019-02-25 MED ORDER — ACETAMINOPHEN 500 MG PO TABS: 1000.0000 mg | ORAL_TABLET | Freq: Once | ORAL | Status: DC

## 2019-02-25 MED ORDER — SODIUM CHLORIDE 0.9 % IV SOLN
500.0000 mg | INTRAVENOUS | Status: DC
  Administered 2019-02-25: 500 mg via INTRAVENOUS
  Filled 2019-02-25: qty 500

## 2019-02-25 MED ORDER — ACETAMINOPHEN 325 MG PO TABS
ORAL_TABLET | ORAL | Status: AC
  Administered 2019-02-26: 07:00:00
  Filled 2019-02-25: qty 2

## 2019-02-25 MED ORDER — ACETAMINOPHEN 325 MG PO TABS
325.0000 mg | ORAL_TABLET | Freq: Once | ORAL | Status: AC
  Administered 2019-02-25: 325 mg via ORAL
  Filled 2019-02-25: qty 1

## 2019-02-25 MED ORDER — ACETAMINOPHEN 325 MG PO TABS
650.0000 mg | ORAL_TABLET | Freq: Once | ORAL | Status: AC
  Administered 2019-02-25: 650 mg via ORAL

## 2019-02-25 MED ORDER — SODIUM CHLORIDE 0.9 % IV SOLN
2.0000 g | INTRAVENOUS | Status: DC
  Administered 2019-02-25: 2 g via INTRAVENOUS
  Filled 2019-02-25: qty 20

## 2019-02-25 NOTE — ED Notes (Addendum)
Date and time results received: 02/25/19 2014 (use smartphrase ".now" to insert current time)  Test: Lactic Critical Value: 2.3  Name of Provider Notified: Long Md  Orders Received? Or Actions Taken?: Waiting on orders

## 2019-02-25 NOTE — ED Provider Notes (Signed)
Care assumed from Dr. Laverta Baltimore.  Patient with cough and shortness of breath with known coronavirus exposures.  She is hypoxic and tachypneic requiring nonrebreather but in no distress.  Coronavirus test is positive.  Remains hypoxic and tachypneic requiring nonrebreather but in no distress.  She will require admission for hypoxic respiratory failure secondary to coronavirus.  Admission discussed with Dr. Kathline Magic was evaluated in Emergency Department on 02/25/2019 for the symptoms described in the history of present illness. She was evaluated in the context of the global COVID-19 pandemic, which necessitated consideration that the patient might be at risk for infection with the SARS-CoV-2 virus that causes COVID-19. Institutional protocols and algorithms that pertain to the evaluation of patients at risk for COVID-19 are in a state of rapid change based on information released by regulatory bodies including the CDC and federal and state organizations. These policies and algorithms were followed during the patient's care in the ED.  CRITICAL CARE Performed by: Ezequiel Essex Total critical care time: 32 minutes Critical care time was exclusive of separately billable procedures and treating other patients. Critical care was necessary to treat or prevent imminent or life-threatening deterioration. Critical care was time spent personally by me on the following activities: development of treatment plan with patient and/or surrogate as well as nursing, discussions with consultants, evaluation of patient's response to treatment, examination of patient, obtaining history from patient or surrogate, ordering and performing treatments and interventions, ordering and review of laboratory studies, ordering and review of radiographic studies, pulse oximetry and re-evaluation of patient's condition.     Ezequiel Essex, MD 02/26/19 [phone]

## 2019-02-25 NOTE — ED Notes (Addendum)
Pts son was contacted and given an update, would like to be contacted when admission is determined and with COVID results. Pts son lives in [place] and pt has no family locally.   Lavonda Jumbo [phone]

## 2019-02-25 NOTE — ED Triage Notes (Signed)
Pt BIBA from home.   Per EMS- SHOB/ cough x1 week.  Dx with UTI by PCP on Thursday.  [facility] with exertion. Possible COVID exposures at church.    VS- 78% O2 on RA--> 97% on 3L [place] O2 114/84 100 HR  AOx4

## 2019-02-25 NOTE — ED Notes (Signed)
Pt reports receiving flu vaccine appx 3 weeks ago, "that's when I started feeling bad."

## 2019-02-25 NOTE — ED Notes (Signed)
Pt O2 on RA- 73% as reported by NT.  Pt placed on [place] 6L- O2-89%.  Pt placed on NRB Os sats 98%

## 2019-02-25 NOTE — ED Notes (Signed)
During assessment and when this nurse was in the room starting pts ABX, noted some mild disorientation. Pt was still A&Ox4 but had some repetitive questioning. EDP made aware.

## 2019-02-25 NOTE — ED Provider Notes (Signed)
Emergency Department Provider Note   I have reviewed the triage vital signs and the nursing notes.   HISTORY  Chief Complaint Cough and Shortness of Breath   HPI [name] is a 73 y.o. female with PMH of HTN presents to the ED with SOB, fever, generalized weakness which is been worsening over the past 5 to 6 days.  Patient states there is been several COVID positive individuals at church which she has been in contact with but has maintain social distance.  She denies chest pain, abdominal symptoms, or sore throat.  No history of breathing issues.  She states that she has become increasingly more short of breath and family finally convinced her to call EMS.  Sats on arrival were found to be 73%.  Patient was started on nasal cannula oxygen and is feeling somewhat better.  She does advise that she is FULL CODE.    Past Medical History:  Diagnosis Date  . Hypertension     Patient Active Problem List   Diagnosis Date Noted  . Pneumonia due to COVID-19 virus 02/26/2019  . Respiratory failure (HCC)/ hypoxic 02/26/2019  . Essential hypertension 02/26/2019  . Dyslipidemia 02/26/2019  . GERD (gastroesophageal reflux disease) 02/26/2019  . Bunion 08/21/2014  . 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 Onychomycosis 02/28/2013  . Pain, foot 02/28/2013    Past Surgical History:  Procedure Laterality Date  . MEDIAL PARTIAL KNEE REPLACEMENT Bilateral 06/07/12    Allergies Shellfish-derived products, Codeine, Tetracyclines & related, and Vibramycin [doxycycline calcium]  History reviewed. No pertinent family history.  Social History Social History   Tobacco Use  . Smoking status: Never Smoker  . Smokeless tobacco: Never Used  Substance Use Topics  . Alcohol use: No    Alcohol/week: 0.0 standard drinks  . Drug use: No    Review of Systems  Constitutional: Positive fever and generalized weakness.  Eyes: No visual changes. ENT: No sore throat. Cardiovascular: Denies  chest pain. Respiratory: Positive shortness of breath. Gastrointestinal: No abdominal pain.  No nausea, no vomiting.  No diarrhea.  No constipation. Genitourinary: Negative for dysuria. Musculoskeletal: Negative for back pain. Skin: Negative for rash. Neurological: Negative for headaches, focal weakness or numbness.  10-point ROS otherwise negative.  ____________________________________________   PHYSICAL EXAM:  VITAL SIGNS: ED Triage Vitals  Enc Vitals Group     BP 02/25/19 1851 118/62     Pulse Rate 02/25/19 1851 (!) 104     Resp 02/25/19 1851 (!) 31     Temp 02/25/19 1856 (!) 100.9 F (38.3 C)     Temp Source 02/25/19 1856 Oral     SpO2 02/25/19 1850 90 %     Weight 02/25/19 1852 225 lb (102.1 kg)     Height 02/25/19 1852 5' 4.5" (1.638 m)   Constitutional: Alert and oriented. Well appearing and in no acute distress. Eyes: Conjunctivae are normal. Head: Atraumatic. Nose: No congestion/rhinnorhea. Mouth/Throat: Mucous membranes are moist.  Neck: No stridor.   Cardiovascular: Tachycardia. Good peripheral circulation. Grossly normal heart sounds.   Respiratory: Increased respiratory effort.  No retractions. Lungs CTAB. Gastrointestinal: Soft and nontender. No distention.  Musculoskeletal: No lower extremity tenderness with trace bilateral pitting edema. No gross deformities of extremities. Neurologic:  Normal speech and language.  Skin:  Skin is warm, dry and intact. No rash noted.  ____________________________________________   LABS (all labs ordered are listed, but only abnormal results are displayed)  Labs Reviewed  SARS CORONAVIRUS 2 BY RT PCR [facility]  ORDER, PERFORMED IN [facility]) - Abnormal; Notable for the following components:      Result Value   SARS Coronavirus 2 POSITIVE (*)    All other components within normal limits  LACTIC ACID, PLASMA - Abnormal; Notable for the following components:   Lactic Acid, Venous 2.3 (*)    All other  components within normal limits  CBC WITH DIFFERENTIAL/PLATELET - Abnormal; Notable for the following components:   Hemoglobin 11.8 (*)    Abs Immature Granulocytes 0.09 (*)    All other components within normal limits  COMPREHENSIVE METABOLIC PANEL - Abnormal; Notable for the following components:   Sodium 132 (*)    Chloride 95 (*)    Glucose, Bld 130 (*)    Creatinine, Ser 1.02 (*)    Calcium 7.7 (*)    Albumin 2.4 (*)    AST 55 (*)    Total Bilirubin 2.3 (*)    GFR calc non Af Amer 54 (*)    All other components within normal limits  D-DIMER, QUANTITATIVE (NOT [facility]) - Abnormal; Notable for the following components:   D-Dimer, Quant 1.30 (*)    All other components within normal limits  LACTATE DEHYDROGENASE - Abnormal; Notable for the following components:   LDH 610 (*)    All other components within normal limits  FIBRINOGEN - Abnormal; Notable for the following components:   Fibrinogen 748 (*)    All other components within normal limits  C-REACTIVE PROTEIN - Abnormal; Notable for the following components:   CRP 24.7 (*)    All other components within normal limits  CULTURE, BLOOD (ROUTINE X 2)  CULTURE, BLOOD (ROUTINE X 2)  MRSA PCR SCREENING  LACTIC ACID, PLASMA  FERRITIN  TRIGLYCERIDES  PROCALCITONIN  ABO/RH  TROPONIN I (HIGH SENSITIVITY)   ____________________________________________  EKG   EKG Interpretation  Date/Time:  Sunday February 25 2019 18:47:41 EDT Ventricular Rate:  105 PR Interval:    QRS Duration: 93 QT Interval:  334 QTC Calculation: 442 R Axis:   45 Text Interpretation:  Sinus tachycardia Prolonged PR interval No STEMI  Confirmed by Alona BeneLong, Joshua [phone]) on 02/25/2019 7:08:29 PM       ____________________________________________  RADIOLOGY  Dg Chest Port 1 View  Result Date: 02/25/2019 CLINICAL DATA:  Short of breath and cough for 1 week. Possible COVID-19 exposure at church. EXAM: PORTABLE CHEST 1 VIEW COMPARISON:  06/17/2009  FINDINGS: Patient rotated minimally right. Normal heart size. Apparent superior mediastinal soft tissue fullness could be due to AP portable technique and low lung volumes. No pleural effusion or pneumothorax. Multifocal bilateral interstitial and airspace opacities. IMPRESSION: Bilateral interstitial and airspace opacities. Given clinical history, favor atypical/viral pneumonia as can be seen with COVID-19 pneumonia. Pulmonary edema felt less likely, given lack of significant pleural fluid. Apparent soft tissue fullness within the superior mediastinum could be technique related. Cannot exclude adenopathy. Recommend attention on follow-up. Electronically Signed   By: Jeronimo GreavesKyle  Talbot M.D.   On: 02/25/2019 20:07    ____________________________________________   PROCEDURES  Procedure(s) performed:   Procedures  CRITICAL CARE Performed by: Maia PlanJoshua G Long Total critical care time: 45 minutes Critical care time was exclusive of separately billable procedures and treating other patients. Critical care was necessary to treat or prevent imminent or life-threatening deterioration. Critical care was time spent personally by me on the following activities: development of treatment plan with patient and/or surrogate as well as nursing, discussions with consultants, evaluation of patient's response to treatment,  examination of patient, obtaining history from patient or surrogate, ordering and performing treatments and interventions, ordering and review of laboratory studies, ordering and review of radiographic studies, pulse oximetry and re-evaluation of patient's condition.  Alona Bene, MD Emergency Medicine  ____________________________________________   INITIAL IMPRESSION / ASSESSMENT AND PLAN / ED COURSE  Pertinent labs & imaging results that were available during my care of the patient were reviewed by me and considered in my medical decision making (see chart for details).   Patient presents to the  emergency department with generalized weakness, shortness of breath.  She is found to be profoundly hypoxic.  On arrival she has fever, tachycardia, tachypnea.  Despite her vital signs she looks relatively well and is able to provide a full history.  Suspicion for COVID-19 is elevated although considering other infectious etiology.  Initial screening labs obtained and chest x-ray is pending.  Do not hear wheezing.  Considering noninfection etiologies as well.  No clear ischemic changes on EKG.  PE remains a differential as well.  Labs consistent with COVID 19 diagnosis. No change in mental status or respiratory status. Continues to require NRB at 15L. Unable to wean further. COVID test is pending which would change admit facility. CXR reviewed. Abx given while awaiting COVID results. Care transferred to Dr. Manus Gunning pending test results and admit.  ____________________________________________  FINAL CLINICAL IMPRESSION(S) / ED DIAGNOSES  Final diagnoses:  Acute respiratory failure with hypoxia (HCC)  COVID-19 virus infection     MEDICATIONS GIVEN DURING THIS VISIT:  Medications  aspirin EC tablet 81 mg (81 mg Oral Given 02/26/19 0918)  rosuvastatin (CRESTOR) tablet 5 mg (5 mg Oral Given 02/26/19 0918)  pantoprazole (PROTONIX) EC tablet 40 mg (40 mg Oral Given 02/26/19 0918)  remdesivir 200 mg in sodium chloride 0.9 % 250 mL IVPB (0 mg Intravenous Stopped 02/26/19 0255)    Followed by  remdesivir 100 mg in sodium chloride 0.9 % 250 mL IVPB (has no administration in time range)  enoxaparin (LOVENOX) injection 50 mg (has no administration in time range)  dexamethasone (DECADRON) injection 6 mg (has no administration in time range)  acetaminophen (TYLENOL) tablet 650 mg (650 mg Oral Given 02/25/19 1907)  acetaminophen (TYLENOL) 325 MG tablet (  Given by Other 02/26/19 0706)  acetaminophen (TYLENOL) tablet 325 mg (325 mg Oral Given 02/25/19 2138)     Note:  This document was prepared using  Dragon voice recognition software and may include unintentional dictation errors.  Alona Bene, MD, [facility] Emergency Medicine    Long, Arlyss Repress, MD 02/26/19 1352

## 2019-02-26 ENCOUNTER — Encounter (HOSPITAL_COMMUNITY): Payer: Self-pay

## 2019-02-26 DIAGNOSIS — J1289 Other viral pneumonia: Secondary | ICD-10-CM

## 2019-02-26 DIAGNOSIS — Z791 Long term (current) use of non-steroidal anti-inflammatories (NSAID): Secondary | ICD-10-CM | POA: Diagnosis not present

## 2019-02-26 DIAGNOSIS — U071 COVID-19: Secondary | ICD-10-CM | POA: Diagnosis present

## 2019-02-26 DIAGNOSIS — Z91018 Allergy to other foods: Secondary | ICD-10-CM | POA: Diagnosis not present

## 2019-02-26 DIAGNOSIS — Z79899 Other long term (current) drug therapy: Secondary | ICD-10-CM | POA: Diagnosis not present

## 2019-02-26 DIAGNOSIS — G9341 Metabolic encephalopathy: Secondary | ICD-10-CM | POA: Diagnosis present

## 2019-02-26 DIAGNOSIS — Z881 Allergy status to other antibiotic agents status: Secondary | ICD-10-CM | POA: Diagnosis not present

## 2019-02-26 DIAGNOSIS — J9601 Acute respiratory failure with hypoxia: Secondary | ICD-10-CM

## 2019-02-26 DIAGNOSIS — E785 Hyperlipidemia, unspecified: Secondary | ICD-10-CM | POA: Diagnosis present

## 2019-02-26 DIAGNOSIS — J9690 Respiratory failure, unspecified, unspecified whether with hypoxia or hypercapnia: Secondary | ICD-10-CM

## 2019-02-26 DIAGNOSIS — E860 Dehydration: Secondary | ICD-10-CM | POA: Diagnosis present

## 2019-02-26 DIAGNOSIS — R17 Unspecified jaundice: Secondary | ICD-10-CM

## 2019-02-26 DIAGNOSIS — N179 Acute kidney failure, unspecified: Secondary | ICD-10-CM

## 2019-02-26 DIAGNOSIS — K219 Gastro-esophageal reflux disease without esophagitis: Secondary | ICD-10-CM | POA: Diagnosis present

## 2019-02-26 DIAGNOSIS — Z7982 Long term (current) use of aspirin: Secondary | ICD-10-CM | POA: Diagnosis not present

## 2019-02-26 DIAGNOSIS — I10 Essential (primary) hypertension: Secondary | ICD-10-CM

## 2019-02-26 DIAGNOSIS — Z9181 History of falling: Secondary | ICD-10-CM | POA: Diagnosis not present

## 2019-02-26 DIAGNOSIS — Z96653 Presence of artificial knee joint, bilateral: Secondary | ICD-10-CM | POA: Diagnosis present

## 2019-02-26 DIAGNOSIS — R7989 Other specified abnormal findings of blood chemistry: Secondary | ICD-10-CM | POA: Diagnosis not present

## 2019-02-26 DIAGNOSIS — E669 Obesity, unspecified: Secondary | ICD-10-CM | POA: Diagnosis present

## 2019-02-26 DIAGNOSIS — E876 Hypokalemia: Secondary | ICD-10-CM | POA: Diagnosis present

## 2019-02-26 DIAGNOSIS — Z79891 Long term (current) use of opiate analgesic: Secondary | ICD-10-CM | POA: Diagnosis not present

## 2019-02-26 DIAGNOSIS — Z885 Allergy status to narcotic agent status: Secondary | ICD-10-CM | POA: Diagnosis not present

## 2019-02-26 LAB — ABO/RH: ABO/RH(D): O POS

## 2019-02-26 LAB — MRSA PCR SCREENING: MRSA by PCR: NEGATIVE

## 2019-02-26 LAB — PROCALCITONIN: Procalcitonin: <0.1 ng/mL

## 2019-02-26 MED ORDER — ASPIRIN EC 81 MG PO TBEC
81.0000 mg | DELAYED_RELEASE_TABLET | Freq: Every day | ORAL | Status: DC
  Administered 2019-02-26 – 2019-03-07 (×10): 81 mg via ORAL
  Filled 2019-02-26 (×10): qty 1

## 2019-02-26 MED ORDER — ROSUVASTATIN CALCIUM 5 MG PO TABS
5.0000 mg | ORAL_TABLET | Freq: Every day | ORAL | Status: DC
  Administered 2019-02-26 – 2019-03-07 (×10): 5 mg via ORAL
  Filled 2019-02-26 (×10): qty 1

## 2019-02-26 MED ORDER — DEXAMETHASONE SODIUM PHOSPHATE 10 MG/ML IJ SOLN
6.0000 mg | Freq: Every day | INTRAMUSCULAR | Status: DC
  Administered 2019-02-27 – 2019-03-05 (×7): 6 mg via INTRAVENOUS
  Filled 2019-02-26 (×7): qty 1

## 2019-02-26 MED ORDER — SODIUM CHLORIDE 0.9 % IV SOLN
200.0000 mg | Freq: Once | INTRAVENOUS | Status: AC
  Administered 2019-02-26: 200 mg via INTRAVENOUS
  Filled 2019-02-26: qty 40

## 2019-02-26 MED ORDER — ENOXAPARIN SODIUM 40 MG/0.4ML ~~LOC~~ SOLN
40.0000 mg | Freq: Every day | SUBCUTANEOUS | Status: DC
  Administered 2019-02-26: 40 mg via SUBCUTANEOUS
  Filled 2019-02-26: qty 0.4

## 2019-02-26 MED ORDER — SODIUM CHLORIDE 0.9 % IV SOLN
100.0000 mg | INTRAVENOUS | Status: AC
  Administered 2019-02-27 – 2019-03-02 (×4): 100 mg via INTRAVENOUS
  Filled 2019-02-26 (×4): qty 20

## 2019-02-26 MED ORDER — DEXAMETHASONE SODIUM PHOSPHATE 10 MG/ML IJ SOLN
10.0000 mg | Freq: Every day | INTRAMUSCULAR | Status: DC
  Administered 2019-02-26 (×2): 10 mg via INTRAVENOUS
  Filled 2019-02-26 (×2): qty 1

## 2019-02-26 MED ORDER — PANTOPRAZOLE SODIUM 40 MG PO TBEC
40.0000 mg | DELAYED_RELEASE_TABLET | Freq: Every day | ORAL | Status: DC
  Administered 2019-02-26 – 2019-03-07 (×10): 40 mg via ORAL
  Filled 2019-02-26 (×10): qty 1

## 2019-02-26 MED ORDER — ENOXAPARIN SODIUM 60 MG/0.6ML ~~LOC~~ SOLN
50.0000 mg | Freq: Every day | SUBCUTANEOUS | Status: DC
  Administered 2019-02-26 – 2019-02-28 (×3): 50 mg via SUBCUTANEOUS
  Filled 2019-02-26 (×3): qty 0.6

## 2019-02-26 MED ORDER — GUAIFENESIN-DM 100-10 MG/5ML PO SYRP
10.0000 mL | ORAL_SOLUTION | ORAL | Status: DC | PRN
  Administered 2019-02-26: 10 mL via ORAL
  Filled 2019-02-26: qty 10

## 2019-02-26 NOTE — Progress Notes (Signed)
I spoke over the phone with the patient's friend Romie Minus about patient's  condition, plan of care and all questions were addressed.  Romie Minus is in close contact with patient's son.

## 2019-02-26 NOTE — ED Notes (Signed)
Pts family was made aware of pt status and that she would be admitted to [facility] and pts family was "ok" with same. Pt unable to sign EMTALA herself due to acute changes in mental status from acute respiratory failure.

## 2019-02-26 NOTE — Progress Notes (Signed)
Notified son, Zane Herald, of progress.  All questions were answered and this nurse's contact number shared for further communication.

## 2019-02-26 NOTE — Progress Notes (Addendum)
PROGRESS NOTE    [name]  BPZ:[id number] DOB: 1945-10-31 DOA: 02/25/2019 PCP: Vincente Liberty, MD    Brief Narrative:  73 year old female who presented with confusion.  She does have significant past medical history for hypertension GERD and dyslipidemia.  She reported 2 weeks of decreased appetite, on the day of admission her son (who lives in [place]) noticed her to be confused while talking over the phone, a patient's friend from church called the ambulance.  When EMS arrived her oxygen saturation was 73%, she required 15 L by nonrebreather and she was transported to the emergency department. On her initial physical examination her blood pressure 119/63, HR 84, RR 32, with oxygen saturation 93 to 98% on supplemental oxygen per NRB mask. Lungs with positive breath sounds bilaterally, heart with S1-S2 present and rhythmic, abdomen soft and no lower extremity edema.  NA 132, K 4,6, Cl 95, Bicarb 22, glucose 130, BUN 11, cr 1,0. AST 55 and ALT 32. WBC 9,1, Hgb 11.8, Hct 38,5, Plt 379. SARS COVID 19 positive.   Chest film with had right upper and left lower lobe interstitial infiltrates.  EKG 805 bpm, normal axis, normal intervals, sinus rhythm, no ST segment T wave changes.  Patient was admitted to the hospital with a working diagnosis of acute hypoxic respiratory failure due to SARS COVID-19 viral pneumonia.   Assessment & Plan:   Principal Problem:   Respiratory failure (HCC)/ hypoxic Active Problems:   Pneumonia due to COVID-19 virus   Essential hypertension   Dyslipidemia   GERD (gastroesophageal reflux disease)  1. Acute hypoxic respiratory failure due to SARS COVID 19 viral pneumonia present on admission. Chest film with bilateral infiltrates. Patient on admission had high oxygen requirements with non rebreather mask 100 % Fi02, through the course of her hospitalization down to 12 and then 7 LPM per HFNC with good toleration.   RR:27 Fi02: 48% with 7 LPM per HFNC  Pulse oxymetry:93 to 95%  Ferritin: 238 CRP:24.7 D-Dimer: 1.30  Negative procalcitonine, will dc antibiotic therapy (ceftriaxone and azithromycin). Continue with Remdesivir #1/5 and systemic steroids with IV dexamethasone 6 mg daily. Continue Zinc and vitamin C. Follow inflammatory markers and close oxymetry monitoring.   2. AKI. Renal function with serum cr at 1,02, K at 4,6 and serum bicarbonate at 22. Will follow on renal panel in am. Patient with improved oral intake, will hold on IV fluids, follow a restrictive IV fluids strategy.   3. HTN. Blood pressure 95/69 and 99/55 with MAP more than 65 mmhg. Will continue to hold on antihypertensive medications. Close blood pressure monitoring.   4. Elevated liver enzymes. AST 55 and ALT 32, will follow on liver profile in am, continue with Remdesivir for now.   5. Metabolic encephalopathy. Patient is more awake and alert, following commands, will continue neuro checks per unit protocol.     DVT prophylaxis: enoxaparin   Code Status:  full Family Communication: no family at the bedside  Disposition Plan/ discharge barriers: pending clinical improvement.   Body mass index is 39.57 kg/m. Malnutrition Type:      Malnutrition Characteristics:      Nutrition Interventions:     RN Pressure Injury Documentation:     Consultants:     Procedures:     Antimicrobials:   Remdesivir.     Subjective: Patient continue to have dyspnea but improved in intensity, not back to baseline. No chest pain, no nausea or vomiting.   Objective: Vitals:   02/26/19 0400  02/26/19 0430 02/26/19 0500 02/26/19 0724  BP: (!) 100/55 93/73 109/71 95/69  Pulse: 70 72 70 79  Resp: (!) 43 18 (!) 23 (!) 27  Temp:   98.4 F (36.9 C) 97.8 F (36.6 C)  TempSrc:   Oral Oral  SpO2: 99% 99% 100% 99%  Weight:   106.2 kg   Height:   5' 4.5" (1.638 m)     Intake/Output Summary (Last 24 hours) at 02/26/2019 0750 Last data filed at 02/26/2019 0255  Gross per 24 hour  Intake 600 ml  Output -  Net 600 ml   Filed Weights   02/25/19 1852 02/26/19 0500  Weight: 102.1 kg 106.2 kg    Examination:   General: Not in pain or dyspnea, deconditioned.  Neurology: Awake and alert, non focal  E ENT: mild pallor, no icterus, oral mucosa moist Cardiovascular: No JVD. S1-S2 present, rhythmic, no gallops, rubs, or murmurs. No lower extremity edema. Pulmonary: positive breath sounds bilaterally. Gastrointestinal. Abdomen with no organomegaly, non tender, no rebound or guarding Skin. No rashes Musculoskeletal: no joint deformities     Data Reviewed: I have personally reviewed following labs and imaging studies  CBC: Recent Labs  Lab 02/25/19 1908  WBC 9.1  NEUTROABS 7.4  HGB 11.8*  HCT 38.5  MCV 85.9  PLT 379   Basic Metabolic Panel: Recent Labs  Lab 02/25/19 1908  NA 132*  K 4.6  CL 95*  CO2 22  GLUCOSE 130*  BUN 11  CREATININE 1.02*  CALCIUM 7.7*   GFR: Estimated Creatinine Clearance: 58.1 mL/min (A) (by C-G formula based on SCr of 1.02 mg/dL (H)). Liver Function Tests: Recent Labs  Lab 02/25/19 1908  AST 55*  ALT 32  ALKPHOS 57  BILITOT 2.3*  PROT 6.5  ALBUMIN 2.4*   No results for input(s): LIPASE, AMYLASE in the last 168 hours. No results for input(s): AMMONIA in the last 168 hours. Coagulation Profile: No results for input(s): INR, PROTIME in the last 168 hours. Cardiac Enzymes: No results for input(s): CKTOTAL, CKMB, CKMBINDEX, TROPONINI in the last 168 hours. BNP (last 3 results) No results for input(s): PROBNP in the last 8760 hours. HbA1C: No results for input(s): HGBA1C in the last 72 hours. CBG: No results for input(s): GLUCAP in the last 168 hours. Lipid Profile: Recent Labs    02/25/19 1908  TRIG 107   Thyroid Function Tests: No results for input(s): TSH, T4TOTAL, FREET4, T3FREE, THYROIDAB in the last 72 hours. Anemia Panel: Recent Labs    02/25/19 1908  FERRITIN 238       Radiology Studies: I have reviewed all of the imaging during this hospital visit personally     Scheduled Meds: . aspirin EC  81 mg Oral Daily  . dexamethasone (DECADRON) injection  10 mg Intravenous Daily  . enoxaparin (LOVENOX) injection  50 mg Subcutaneous QHS  . pantoprazole  40 mg Oral Daily  . rosuvastatin  5 mg Oral Daily   Continuous Infusions: . azithromycin Stopped (02/25/19 2246)  . cefTRIAXone (ROCEPHIN)  IV Stopped (02/25/19 2215)  . [START ON 02/27/2019] remdesivir 100 mg in NS 250 mL       LOS: 0 days        Mauricio Annett Gula, MD

## 2019-02-26 NOTE — H&P (Signed)
History and Physical    [name]:[id number] DOB: Sep 17, 1945 DOA: 02/25/2019  PCP: Vincente Liberty, MD  Patient coming from: Home by ambulance  I have personally briefly reviewed patient's old medical records in [place]  Chief Complaint: Confusion  HPI: [name] is a 73 y.o. female with medical history significant of hypertension, GERD, hyperlipidemia who presents with concerns of confusion.  Patient reports that about 2 weeks ago she began to notice decreased appetite.  However she denies any shortness of breath.  Today, son who lives in [place] gave her a call and thought she sounded confused and had someone at church help her to call an ambulance. She was found by EMS to be hypoxic down to 73% on room air.  Was placed on nasal cannula but remained at 84% and ultimately had to be placed on 15 L nonrebreather. Patient continues denies of any shortness of breath.  No chest pain.  No nausea, vomiting or diarrhea.  Denies any fevers or chills.  No new cough or runny nose.  She reports that her pastor at church was positive for COVID about 2 weeks ago.  CBC showed no leukocytosis and mild anemia of 11.8.  CMP showed sodium of 132, glucose of 130, creatinine of 1.03 from a baseline of normal.  Mildly elevated AST, elevated bilirubin of 2.3.  Lactate of 1.4 on repeat from 2.3.  LDH of 610, ferritin of 238, CRP of 24.7, d-dimer of 1.3.  EKG shows bilateral interstitial and airspace opacity. Positive COVID test.  She was initially started on Rocephin and azithromycin in the ED  Review of Systems:  Constitutional: No Weight Change, No Fever ENT/Mouth: No sore throat, No Rhinorrhea Eyes: No Eye Pain, No Vision Changes Cardiovascular: No Chest Pain, no SOB Respiratory: No Cough, No Sputum, No Wheezing, no Dyspnea  Gastrointestinal: No Nausea, No Vomiting, No Diarrhea, No Constipation, No Pain Genitourinary: no Urinary Incontinence, No Urgency, No Flank Pain  Musculoskeletal: No Arthralgias, No Myalgias Skin: No Skin Lesions, No Pruritus, Neuro: no Weakness, No Numbness,  No Loss of Consciousness, No Syncope Psych: No Anxiety/Panic, No Depression, no decrease appetite Heme/Lymph: No Bruising, No Bleeding  Past Medical History:  Diagnosis Date  . Hypertension     Past Surgical History:  Procedure Laterality Date  . MEDIAL PARTIAL KNEE REPLACEMENT Bilateral 06/07/12     reports that she has never smoked. She has never used smokeless tobacco. She reports that she does not drink alcohol or use drugs.  Allergies  Allergen Reactions  . Shellfish-Derived Products Other (See Comments)    Shrimp and lobster--severe swelling/itching  . Codeine Nausea And Vomiting  . Tetracyclines & Related Nausea And Vomiting  . Vibramycin [Doxycycline Calcium] Nausea And Vomiting    No family history on file. : Family history reviewed and not pertinent   Prior to Admission medications   Medication Sig Start Date End Date Taking? Authorizing Provider  aspirin EC 81 MG tablet Take 81 mg by mouth daily.   Yes [provider]  Diethylpropion HCl CR 75 MG TB24 Take 75 mg by mouth daily. Reported on 06/03/2015 07/17/14  Yes [provider]  hydrochlorothiazide (HYDRODIURIL) 12.5 MG tablet Take 12.5 mg by mouth daily.  02/06/19  Yes [provider]  losartan (COZAAR) 50 MG tablet Take 50 mg by mouth daily. 02/06/19  Yes [provider]  omeprazole (PRILOSEC) 40 MG capsule Take 40 mg by mouth daily.  08/01/14  Yes [provider]  rosuvastatin (CRESTOR) 5 MG tablet Take 5 mg by mouth daily.   Yes [provider]  clindamycin (CLEOCIN) 150 MG capsule Take 1 capsule (150 mg total) by mouth every 6 (six) hours. Patient not taking: Reported on 02/25/2019 06/10/18   Alene Mires, NP  ibuprofen (ADVIL,MOTRIN) 600 MG tablet Take 1 tablet (600 mg total) by mouth every 6 (six) hours as needed. Patient not taking:  Reported on 02/25/2019 03/24/13   Ozella Rocks, MD  methocarbamol (ROBAXIN) 500 MG tablet Take 1 tablet (500 mg total) by mouth every 6 (six) hours as needed for muscle spasms. Patient not taking: Reported on 02/25/2019 12/29/17   Kathryne Hitch, MD  traMADol (ULTRAM) 50 MG tablet Take 1 tablet (50 mg total) by mouth every 6 (six) hours as needed. Patient not taking: Reported on 02/25/2019 03/24/13   Ozella Rocks, MD    Physical Exam: Vitals:   02/25/19 2249 02/25/19 2300 02/25/19 2330 02/26/19 0000  BP:  119/63 (!) 104/56 116/80  Pulse:  84 79 76  Resp:  (!) 32 (!) 29 (!) 22  Temp: 97.7 F (36.5 C)     TempSrc: Oral     SpO2:  97% 98% 99%  Weight:      Height:        Constitutional: NAD, calm, comfortable, well-appearing female sitting up at 45 degree incline in bed Vitals:   02/25/19 2249 02/25/19 2300 02/25/19 2330 02/26/19 0000  BP:  119/63 (!) 104/56 116/80  Pulse:  84 79 76  Resp:  (!) 32 (!) 29 (!) 22  Temp: 97.7 F (36.5 C)     TempSrc: Oral     SpO2:  97% 98% 99%  Weight:      Height:       Eyes: PERRL, lids and conjunctivae normal ENMT: Mucous membranes are moist. Posterior pharynx clear of any exudate or lesions.Normal dentition.  Neck: normal, supple, no masses, no thyromegaly Respiratory: clear to auscultation bilaterally, no wheezing, no crackles. Normal respiratory effort on 15 L non-rebreather.  No dyspnea noted with conversation. no accessory muscle use.  Cardiovascular: Regular rate and rhythm, no murmurs / rubs / gallops. +3 distal pre-tibial edema of LE extremity. 2+ pedal pulses. Abdomen: no tenderness, no masses palpated.  Bowel sounds positive.  Musculoskeletal: no clubbing / cyanosis. No joint deformity upper and lower extremities. Good ROM, no contractures. Normal muscle tone.  Skin: no rashes, lesions, ulcers. No induration Neurologic: CN 2-12 grossly intact. Sensation intact. Strength 5/5 in all 4.  Psychiatric: Normal judgment and  insight. Alert and oriented x 3. Normal mood.     Labs on Admission: I have personally reviewed following labs and imaging studies  CBC: Recent Labs  Lab 02/25/19 1908  WBC 9.1  NEUTROABS 7.4  HGB 11.8*  HCT 38.5  MCV 85.9  PLT 379   Basic Metabolic Panel: Recent Labs  Lab 02/25/19 1908  NA 132*  K 4.6  CL 95*  CO2 22  GLUCOSE 130*  BUN 11  CREATININE 1.02*  CALCIUM 7.7*   GFR: Estimated Creatinine Clearance: 56.8 mL/min (A) (by C-G formula based on SCr of 1.02 mg/dL (H)). Liver Function Tests: Recent Labs  Lab 02/25/19 1908  AST 55*  ALT 32  ALKPHOS 57  BILITOT 2.3*  PROT 6.5  ALBUMIN 2.4*   No results for input(s): LIPASE, AMYLASE in the last 168 hours. No results for input(s): AMMONIA in the last 168 hours. Coagulation Profile: No results for input(s): INR,  PROTIME in the last 168 hours. Cardiac Enzymes: No results for input(s): CKTOTAL, CKMB, CKMBINDEX, TROPONINI in the last 168 hours. BNP (last 3 results) No results for input(s): PROBNP in the last 8760 hours. HbA1C: No results for input(s): HGBA1C in the last 72 hours. CBG: No results for input(s): GLUCAP in the last 168 hours. Lipid Profile: Recent Labs    02/25/19 1908  TRIG 107   Thyroid Function Tests: No results for input(s): TSH, T4TOTAL, FREET4, T3FREE, THYROIDAB in the last 72 hours. Anemia Panel: Recent Labs    02/25/19 1908  FERRITIN 238   Urine analysis:    Component Value Date/Time   BILIRUBINUR negative 06/03/2015 1024   PROTEINUR trace 06/03/2015 1024   UROBILINOGEN negative 06/03/2015 1024   NITRITE negative 06/03/2015 1024   LEUKOCYTESUR Trace (A) 06/03/2015 1024    Radiological Exams on Admission: Dg Chest Port 1 View  Result Date: 02/25/2019 CLINICAL DATA:  Short of breath and cough for 1 week. Possible COVID-19 exposure at church. EXAM: PORTABLE CHEST 1 VIEW COMPARISON:  06/17/2009 FINDINGS: Patient rotated minimally right. Normal heart size. Apparent superior  mediastinal soft tissue fullness could be due to AP portable technique and low lung volumes. No pleural effusion or pneumothorax. Multifocal bilateral interstitial and airspace opacities. IMPRESSION: Bilateral interstitial and airspace opacities. Given clinical history, favor atypical/viral pneumonia as can be seen with COVID-19 pneumonia. Pulmonary edema felt less likely, given lack of significant pleural fluid. Apparent soft tissue fullness within the superior mediastinum could be technique related. Cannot exclude adenopathy. Recommend attention on follow-up. Electronically Signed   By: Jeronimo GreavesKyle  Talbot M.D.   On: 02/25/2019 20:07    EKG: Independently reviewed.   Assessment/Plan  Pneumonia due to COVID-19 virus -Patient had decreased appetite 2 weeks ago but no respiratory symptoms.  She was found to be hypoxic down to 73% on room air by EMS and ultimately had to be placed on 15 L via nonrebreather. -Start IV Decadron -Start Remdesvir per pharmacy -Started on IV Rocephin and azithromycin in ED. Will continue for now and obtain PCT.   AKI - Creatinine of 1.03 on admission -Likely secondary to dehydration -Continue to monitor with BMP -Avoid nephrotoxic agents  Hypertension - Hold HCTZ and lisinopril due to AKI and borderline BP  Elevated bilirubin, AST -Bilirubin of 2.3 and AST of 55 on admission -Likely reactive due to COVID infection -Continue to monitor  GERD -Continue PPI  Memory decline -Spoke with Son, Tora KindredJelani Gonzales, and he is concerned regarding progressive memory decline and getting Lost. Church members also express the same. Son is concerned because pt lives alone and Son lives out of state.  - will need to follow up outpatient with PCP  DVT prophylaxis:.Lovenox- dimer <5 Code Status:Full-discussed potential of intubation if patient continues to have worsening respiratory status.  Patient is fully aware that she could be placed on a mechanical ventilator if her clinical  condition deteriorates and would like to continue as a full code. Family Communication: Plan discussed with patient at bedside and son over the phone.   Tora KindredJelani Lad  [phone]  disposition Plan: Home with at least 2 midnight stays. [facility] due to high O2 requirement with positive COVID infection.  Consults called:  Admission status: inpatient  Yoselin Amerman T Pati Thinnes DO [facility]   If 7PM-7AM, please contact night-coverage [email or web address] Password [email or web address]  02/26/2019, 12:21 AM

## 2019-02-26 NOTE — Progress Notes (Signed)
PCT came back <0.1, ok to dc ceftriaxone/azith per Dr. Cathlean Sauer.  Onnie Boer, PharmD, BCIDP, AAHIVP, CPP Infectious Disease Pharmacist 02/26/2019 8:28 AM

## 2019-02-26 NOTE — Progress Notes (Addendum)
Ambulated patient to bathroom with 10L HFNC, and front wheel walker.  C/o some SOB, sats 84-89%, will use BSC for further toileting transfers this shift.

## 2019-02-26 NOTE — Progress Notes (Signed)
[name] updated

## 2019-02-26 NOTE — Progress Notes (Signed)
Pharmacy: Remdesivir   Patient is a 73 y.o. female with COVID.  Pharmacy has been consulted for remdesivir dosing.   - ALT: 32  - CXR shows: Bilateral interstitial and airspace opacities. Given clinical history, favor atypical/viral pneumonia as can be seen with COVID-19 pneumonia.   - Pt is requiring supplemental oxygen: (yes)    A/P:  - Patient meets criteria for remdesivir. Will initiate remdesivir 200 mg once followed by 100 mg daily x 4 days.  - Daily CMET while on remdesivir - Will f/u pt's ALT and clinical condition

## 2019-02-27 LAB — COMPREHENSIVE METABOLIC PANEL
ALT: 19 U/L (ref 0–44)
AST: 24 U/L (ref 15–41)
Albumin: 2.2 g/dL — ABNORMAL LOW (ref 3.5–5.0)
Alkaline Phosphatase: 52 U/L (ref 38–126)
Anion gap: 14 (ref 5–15)
BUN: 17 mg/dL (ref 8–23)
CO2: 28 mmol/L (ref 22–32)
Calcium: 8.6 mg/dL — ABNORMAL LOW (ref 8.9–10.3)
Chloride: 96 mmol/L — ABNORMAL LOW (ref 98–111)
Creatinine, Ser: 0.95 mg/dL (ref 0.44–1.00)
GFR calc Af Amer: >60 mL/min (ref >60)
GFR calc non Af Amer: 59 mL/min — ABNORMAL LOW (ref >60)
Glucose, Bld: 190 mg/dL — ABNORMAL HIGH (ref 70–99)
Potassium: 3.4 mmol/L — ABNORMAL LOW (ref 3.5–5.1)
Sodium: 138 mmol/L (ref 135–145)
Total Bilirubin: 0.8 mg/dL (ref 0.3–1.2)
Total Protein: 6.3 g/dL — ABNORMAL LOW (ref 6.5–8.1)

## 2019-02-27 LAB — CBC WITH DIFFERENTIAL/PLATELET
Abs Immature Granulocytes: 0.09 10*3/uL — ABNORMAL HIGH (ref 0.00–0.07)
Basophils Absolute: 0 10*3/uL (ref 0.0–0.1)
Basophils Relative: 0 %
Eosinophils Absolute: 0 10*3/uL (ref 0.0–0.5)
Eosinophils Relative: 0 %
HCT: 37.1 % (ref 36.0–46.0)
Hemoglobin: 11.3 g/dL — ABNORMAL LOW (ref 12.0–15.0)
Immature Granulocytes: 1 %
Lymphocytes Relative: 13 %
Lymphs Abs: 1.5 10*3/uL (ref 0.7–4.0)
MCH: 26.4 pg (ref 26.0–34.0)
MCHC: 30.5 g/dL (ref 30.0–36.0)
MCV: 86.7 fL (ref 80.0–100.0)
Monocytes Absolute: 0.6 10*3/uL (ref 0.1–1.0)
Monocytes Relative: 5 %
Neutro Abs: 8.9 10*3/uL — ABNORMAL HIGH (ref 1.7–7.7)
Neutrophils Relative %: 81 %
Platelets: 429 10*3/uL — ABNORMAL HIGH (ref 150–400)
RBC: 4.28 MIL/uL (ref 3.87–5.11)
RDW: 14.7 % (ref 11.5–15.5)
WBC: 11 10*3/uL — ABNORMAL HIGH (ref 4.0–10.5)
nRBC: 0 % (ref 0.0–0.2)

## 2019-02-27 LAB — C-REACTIVE PROTEIN: CRP: 17.5 mg/dL — ABNORMAL HIGH (ref <1.0)

## 2019-02-27 LAB — D-DIMER, QUANTITATIVE: D-Dimer, Quant: 1.85 ug/mL-FEU — ABNORMAL HIGH (ref 0.00–0.50)

## 2019-02-27 LAB — FERRITIN: Ferritin: 328 ng/mL — ABNORMAL HIGH (ref 11–307)

## 2019-02-27 MED ORDER — POTASSIUM CHLORIDE CRYS ER 20 MEQ PO TBCR
40.0000 meq | EXTENDED_RELEASE_TABLET | Freq: Once | ORAL | Status: AC
  Administered 2019-02-27: 40 meq via ORAL
  Filled 2019-02-27: qty 2

## 2019-02-27 NOTE — Progress Notes (Addendum)
I called patient's son, but unable to reach, left message at [phone]

## 2019-02-27 NOTE — Progress Notes (Signed)
PROGRESS NOTE    [name]  ZLD:[id number] DOB: November 23, 1945 DOA: 02/25/2019 PCP: Corine Shelter, MD    Brief Narrative:  73 year old female who presented with confusion.  She does have significant past medical history for hypertension GERD and dyslipidemia.  She reported 2 weeks of decreased appetite, on the day of admission her son (who lives in [place]) noticed her to be confused while talking over the phone, a patient's friend from church called the ambulance.  When EMS arrived her oxygen saturation was 73%, she required 15 L by nonrebreather and she was transported to the emergency department. On her initial physical examination her blood pressure 119/63, HR 84, RR 32, with oxygen saturation 93 to 98% on supplemental oxygen per NRB mask. Lungs with positive breath sounds bilaterally, heart with S1-S2 present and rhythmic, abdomen soft and no lower extremity edema.  NA 132, K 4,6, Cl 95, Bicarb 22, glucose 130, BUN 11, cr 1,0. AST 55 and ALT 32. WBC 9,1, Hgb 11.8, Hct 38,5, Plt 379. SARS COVID 19 positive.   Chest film with had right upper and left lower lobe interstitial infiltrates.  EKG 805 bpm, normal axis, normal intervals, sinus rhythm, no ST segment T wave changes.  Patient was admitted to the hospital with a working diagnosis of acute hypoxic respiratory failure due to SARS COVID-19 viral pneumonia.  Patient on admission had high oxygen requirements with non rebreather mask 100 % Fi02, but through the course of her hospitalization able to taper supplemental oxygen down.  Assessment & Plan:   Principal Problem:   Respiratory failure (HCC)/ hypoxic Active Problems:   Pneumonia due to COVID-19 virus   Essential hypertension   Dyslipidemia   GERD (gastroesophageal reflux disease)    1. Acute hypoxic respiratory failure due to SARS COVID 19 viral pneumonia present on admission. Chest film with bilateral infiltrates. Patient's dyspnea continue to improve but not  yet back to baseline, intermittent cough, but no chest pain, tolerating po well.   RR: 17 Fi02: 28% with 2 LPM per HFNC Pulse oxymetry: 90 to 93%  Ferritin: 238<328 CRP:24.7>17,5 D-Dimer: 1.30<1,85  Continue medical therapy with Remdesivir #2/5 (AST 24 and ALT 19) and  IV dexamethasone 6 mg daily, along with Zinc and vitamin C. Continue close oxymetry monitoring. Follow with physical therapy for mobility.   2. AKI with hypokalemia. Stable renal function with serum cr at 0.95 with K at 3,4 and serum bicarbonate at 28. K correction with 40 meq Kcl, will continue to follow renal panel in am.  3. HTN. Improved blood pressure 104/71, will continue to hold on antihypertensive medications. Holding IV fluids.   4. Elevated liver enzymes. Improved profile with AST 24 and ALT 19. Continue with Remdesivir.   5. Metabolic encephalopathy. Clinically improved, patient is awake and alert, will have physical therapy evaluation.     Body mass index is 39.57 kg/m. Malnutrition Type:      Malnutrition Characteristics:      Nutrition Interventions:     RN Pressure Injury Documentation:     Consultants:     Procedures:     Antimicrobials:       Subjective: Patient continue to have improved symptoms but not back to baseline, continue very weak and deconditioned. No nausea or vomiting.   Objective: Vitals:   02/26/19 2118 02/26/19 2350 02/27/19 0420 02/27/19 0655  BP:  (!) 132/98 (!) 102/56   Pulse: 85 84 87 79  Resp: (!) 31 (!) 24 (!) 31 20  Temp:  98.2 F (36.8 C) 98.5 F (36.9 C)   TempSrc:  Oral Oral   SpO2: 91% 92% 94% 94%  Weight:      Height:        Intake/Output Summary (Last 24 hours) at 02/27/2019 0839 Last data filed at 02/27/2019 0400 Gross per 24 hour  Intake 600 ml  Output 500 ml  Net 100 ml   Filed Weights   02/25/19 1852 02/26/19 0500  Weight: 102.1 kg 106.2 kg    Examination:   General: Not in pain or dyspnea, deconditioned   Neurology: Awake and alert, non focal  E ENT: mild pallor, no icterus, oral mucosa moist Cardiovascular: No JVD. S1-S2 present, rhythmic, no gallops, rubs, or murmurs. No lower extremity edema. Pulmonary: positive breath sounds bilaterally. Gastrointestinal. Abdomen with no organomegaly, non tender, no rebound or guarding Skin. No rashes Musculoskeletal: no joint deformities     Data Reviewed: I have personally reviewed following labs and imaging studies  CBC: Recent Labs  Lab 02/25/19 1908 02/27/19 0330  WBC 9.1 11.0*  NEUTROABS 7.4 8.9*  HGB 11.8* 11.3*  HCT 38.5 37.1  MCV 85.9 86.7  PLT 379 932*   Basic Metabolic Panel: Recent Labs  Lab 02/25/19 1908 02/27/19 0330  NA 132* 138  K 4.6 3.4*  CL 95* 96*  CO2 22 28  GLUCOSE 130* 190*  BUN 11 17  CREATININE 1.02* 0.95  CALCIUM 7.7* 8.6*   GFR: Estimated Creatinine Clearance: 62.3 mL/min (by C-G formula based on SCr of 0.95 mg/dL). Liver Function Tests: Recent Labs  Lab 02/25/19 1908 02/27/19 0330  AST 55* 24  ALT 32 19  ALKPHOS 57 52  BILITOT 2.3* 0.8  PROT 6.5 6.3*  ALBUMIN 2.4* 2.2*   No results for input(s): LIPASE, AMYLASE in the last 168 hours. No results for input(s): AMMONIA in the last 168 hours. Coagulation Profile: No results for input(s): INR, PROTIME in the last 168 hours. Cardiac Enzymes: No results for input(s): CKTOTAL, CKMB, CKMBINDEX, TROPONINI in the last 168 hours. BNP (last 3 results) No results for input(s): PROBNP in the last 8760 hours. HbA1C: No results for input(s): HGBA1C in the last 72 hours. CBG: No results for input(s): GLUCAP in the last 168 hours. Lipid Profile: Recent Labs    02/25/19 1908  TRIG 107   Thyroid Function Tests: No results for input(s): TSH, T4TOTAL, FREET4, T3FREE, THYROIDAB in the last 72 hours. Anemia Panel: Recent Labs    02/25/19 1908  FERRITIN 79      Radiology Studies: I have reviewed all of the imaging during this hospital visit  personally     Scheduled Meds: . aspirin EC  81 mg Oral Daily  . dexamethasone (DECADRON) injection  6 mg Intravenous Daily  . enoxaparin (LOVENOX) injection  50 mg Subcutaneous QHS  . pantoprazole  40 mg Oral Daily  . rosuvastatin  5 mg Oral Daily   Continuous Infusions: . remdesivir 100 mg in NS 250 mL       LOS: 1 day        Vernelle Wisner Gerome Apley, MD

## 2019-02-27 NOTE — Progress Notes (Signed)
Ok to give one dose of KCL 9meq for k+3.4 per Dr Cathlean Sauer.  Onnie Boer, PharmD, BCIDP, AAHIVP, CPP Infectious Disease Pharmacist 02/27/2019 9:26 AM

## 2019-02-27 NOTE — Progress Notes (Signed)
Pt will at times have yellow mews. RR adjusted accordingly. Pt very anxious with staff in room. Once staff leaves pt does not breath at fast. See vital signs

## 2019-02-27 NOTE — Progress Notes (Signed)
Son called, he is updated and all questions answered.

## 2019-02-28 LAB — CBC WITH DIFFERENTIAL/PLATELET
Abs Immature Granulocytes: 0.11 10*3/uL — ABNORMAL HIGH (ref 0.00–0.07)
Basophils Absolute: 0 10*3/uL (ref 0.0–0.1)
Basophils Relative: 0 %
Eosinophils Absolute: 0 10*3/uL (ref 0.0–0.5)
Eosinophils Relative: 0 %
HCT: 36.1 % (ref 36.0–46.0)
Hemoglobin: 11.2 g/dL — ABNORMAL LOW (ref 12.0–15.0)
Immature Granulocytes: 1 %
Lymphocytes Relative: 14 %
Lymphs Abs: 2.1 10*3/uL (ref 0.7–4.0)
MCH: 26.3 pg (ref 26.0–34.0)
MCHC: 31 g/dL (ref 30.0–36.0)
MCV: 84.7 fL (ref 80.0–100.0)
Monocytes Absolute: 0.7 10*3/uL (ref 0.1–1.0)
Monocytes Relative: 5 %
Neutro Abs: 12.1 10*3/uL — ABNORMAL HIGH (ref 1.7–7.7)
Neutrophils Relative %: 80 %
Platelets: 414 10*3/uL — ABNORMAL HIGH (ref 150–400)
RBC: 4.26 MIL/uL (ref 3.87–5.11)
RDW: 14.7 % (ref 11.5–15.5)
WBC: 15 10*3/uL — ABNORMAL HIGH (ref 4.0–10.5)
nRBC: 0 % (ref 0.0–0.2)

## 2019-02-28 LAB — COMPREHENSIVE METABOLIC PANEL
ALT: 15 U/L (ref 0–44)
AST: 18 U/L (ref 15–41)
Albumin: 2.1 g/dL — ABNORMAL LOW (ref 3.5–5.0)
Alkaline Phosphatase: 57 U/L (ref 38–126)
Anion gap: 10 (ref 5–15)
BUN: 18 mg/dL (ref 8–23)
CO2: 29 mmol/L (ref 22–32)
Calcium: 8.2 mg/dL — ABNORMAL LOW (ref 8.9–10.3)
Chloride: 101 mmol/L (ref 98–111)
Creatinine, Ser: 0.73 mg/dL (ref 0.44–1.00)
GFR calc Af Amer: >60 mL/min (ref >60)
GFR calc non Af Amer: >60 mL/min (ref >60)
Glucose, Bld: 99 mg/dL (ref 70–99)
Potassium: 3.6 mmol/L (ref 3.5–5.1)
Sodium: 140 mmol/L (ref 135–145)
Total Bilirubin: 0.7 mg/dL (ref 0.3–1.2)
Total Protein: 5.9 g/dL — ABNORMAL LOW (ref 6.5–8.1)

## 2019-02-28 LAB — D-DIMER, QUANTITATIVE: D-Dimer, Quant: 2.85 ug/mL-FEU — ABNORMAL HIGH (ref 0.00–0.50)

## 2019-02-28 LAB — FERRITIN: Ferritin: 263 ng/mL (ref 11–307)

## 2019-02-28 LAB — C-REACTIVE PROTEIN: CRP: 8.9 mg/dL — ABNORMAL HIGH (ref <1.0)

## 2019-02-28 NOTE — Evaluation (Signed)
Physical Therapy Evaluation Patient Details Name: [name] MRN: [id number] DOB: 1946/03/11 Today's Date: 02/28/2019   History of Present Illness  73 y/o female w/ hx of HTN, GERD, dyslipidemia, presented w/ confusion and hypoxemia  Clinical Impression   Pt admitted with above diagnosis. PTA was living alone in apartment and was very independent.  Pt currently with functional limitations due to the deficits listed below (see PT Problem List). Pt will benefit from skilled PT to increase her overall strength, balance and coordination, activity tolerance,  independence and safety with mobility to allow discharge to the venue listed below.       Follow Up Recommendations Home health PT    Equipment Recommendations       Recommendations for Other Services OT consult     Precautions / Restrictions Precautions Precautions: Fall Restrictions Weight Bearing Restrictions: No      Mobility  Bed Mobility Overal bed mobility: Needs Assistance Bed Mobility: Rolling;Supine to Sit Rolling: Supervision   Supine to sit: Min guard        Transfers Overall transfer level: Needs assistance   Transfers: Sit to/from Stand;Stand Pivot Transfers Sit to Stand: Min guard;Min assist Stand pivot transfers: Min assist       General transfer comment: decreased balance   Ambulation/Gait Ambulation/Gait assistance: Min assist Gait Distance (Feet): 35 Feet Assistive device: Rolling walker (2 wheeled) Gait Pattern/deviations: Step-through pattern     General Gait Details: slow paced ambulation in room  Stairs            Wheelchair Mobility    Modified Rankin (Stroke Patients Only)       Balance Overall balance assessment: Needs assistance Sitting-balance support: Feet supported Sitting balance-Leahy Scale: Good     Standing balance support: Single extremity supported Standing balance-Leahy Scale: Fair                               Pertinent  Vitals/Pain      Home Living Family/patient expects to be discharged to:: Private residence Living Arrangements: Alone   Type of Home: Apartment Home Access: Level entry     Home Layout: One level Home Equipment: (has RW but does not use prior to hospitalization)      Prior Function Level of Independence: Independent         Comments: was I prior to this living home alone     Hand Dominance        Extremity/Trunk Assessment   Upper Extremity Assessment Upper Extremity Assessment: Overall WFL for tasks assessed    Lower Extremity Assessment Lower Extremity Assessment: Overall WFL for tasks assessed    Cervical / Trunk Assessment Cervical / Trunk Assessment: Normal  Communication   Communication: No difficulties  Cognition Arousal/Alertness: Awake/alert Behavior During Therapy: WFL for tasks assessed/performed Overall Cognitive Status: Within Functional Limits for tasks assessed                                        General Comments General comments (skin integrity, edema, etc.): Pt moving slowly this am but did well with mobility. States has not be oob since arrival. able to complete bed mob and transfers with SBA-CGA, sit<>stand with CGA and RW sec to weakness in BLE also decreased balance and coordination. Pt is on 4L/min via HFNC and w/ activity desats to 85% min. with  cues able to pursed lip breathe and regain oxygenation to 90s very quickly. Pt has requested to stand at sink and wash up. therapist assisted with set up and SBA while completing, desat to 87% with standing and teeth brushing, needing seated rest to recover. able to complete small wash up w/ wash cloth, standing with uni arm support on RW, during this managed to lose IV (nurse was called and notified of this).    Exercises     Assessment/Plan    PT Assessment Patient needs continued PT services(while in hospital to address I and activity tolerance)  PT Problem List Decreased  strength;Decreased balance;Decreased activity tolerance;Decreased mobility;Decreased coordination;Decreased safety awareness       PT Treatment Interventions Gait training;Functional mobility training;Therapeutic activities;Therapeutic exercise;Balance training;Neuromuscular re-education;Patient/family education    PT Goals (Current goals can be found in the Care Plan section)  Acute Rehab PT Goals Patient Stated Goal: go home at [facility] PT Goal Formulation: With patient Time For Goal Achievement: 03/14/19 Potential to Achieve Goals: Good    Frequency Min 3X/week   Barriers to discharge Other (comment) loves alone    Co-evaluation               AM-PAC PT "6 Clicks" Mobility  Outcome Measure Help needed turning from your back to your side while in a flat bed without using bedrails?: A Little Help needed moving from lying on your back to sitting on the side of a flat bed without using bedrails?: A Little Help needed moving to and from a bed to a chair (including a wheelchair)?: A Little Help needed standing up from a chair using your arms (e.g., wheelchair or bedside chair)?: A Little Help needed to walk in hospital room?: A Little Help needed climbing 3-5 steps with a railing? : A Lot 6 Click Score: 17    End of Session Equipment Utilized During Treatment: Oxygen Activity Tolerance: Treatment limited secondary to medical complications (Comment) Patient left: in chair;with call bell/phone within reach Nurse Communication: Mobility status;Other (comment)(pulled IV out while washing up at sink) PT Visit Diagnosis: Other abnormalities of gait and mobility (R26.89);Muscle weakness (generalized) (M62.81)    Time: 4034-7425 PT Time Calculation (min) (ACUTE ONLY): 37 min   Charges:   PT Evaluation $PT Eval Moderate Complexity: 1 Mod PT Treatments $Therapeutic Activity: 8-22 mins $Neuromuscular Re-education: 8-22 mins        Drema Pry, PT    Freddi Starr 02/28/2019, 2:16 PM

## 2019-02-28 NOTE — Progress Notes (Signed)
Called and spoke with the pt's point of contact/ son [name]. I answered all of his questions, updated him on the vitals, oxygen requirement, and plan of care going forward. He had a few concerns about discharge since the patient lives alone. I passed along his concerns to Dr. Bonner Puna. [name] denied any further questions at this time.

## 2019-02-28 NOTE — Plan of Care (Signed)

## 2019-02-28 NOTE — Progress Notes (Signed)
PROGRESS NOTE  [name]  ZWC:[id number] DOB: 06-19-1945 DOA: 02/25/2019 PCP: Corine Shelter, MD   Brief Narrative: [name] is a 73 y.o. female with a history of HTN, dyslipidemia, and GERD who presented to the ED 10/11 by EMS when she appeared to be confused speaking on the phone. EMS reported hypoxia to 73%, placed on 15L NRB and was found to have positive SARS-CoV-2 testing with bilateral interstitial infiltrates.   Assessment & Plan: Principal Problem:   Respiratory failure (HCC)/ hypoxic Active Problems:   Pneumonia due to COVID-19 virus   Essential hypertension   Dyslipidemia   GERD (gastroesophageal reflux disease)  Acute hypoxic respiratory failure due to covid-19 pneumonia: Persistently hypoxic though improving generally.  - Wean oxygen as tolerated, currently on 4LPM - CRP declining 24.7 > 8.9, still grossly elevated. Will continue steroids and complete 5 days of remdesivir (10/12 - 10/16).  - D-dimer rising, though no symptoms of VTE and hypoxia improving. Continue lovenox 0.5mg /kg q24h. If hypoxia refractory to covid treatment or worsening, would need to consider CTA chest. CrCl 87ml/min, no IV contrast dye allergy listed. - Continue airborne, contact precautions. PPE including surgical gown, gloves, cap, shoe covers, and CAPR used during this encounter in a negative pressure room.  - Check daily labs: CBC w/diff, CMP, d-dimer, ferritin, CRP - Recommend proning and aggressive use of incentive spirometry.  AKI:  - Avoid nephrotoxins and trend metabolic panel  Hypokalemia:  - Monitor and supplement as indicated  HTN:  - Hold antihypertensive with softer BPs  LFT elevation: Improved.  - continue monitoring with remdesivir Tx  Acute metabolic encephalopathy due to covid-19 infection: Improving.   Obesity: BMI 39.  - Noted  DVT prophylaxis: Lovenox 50mg  q24h Code Status: Full Family Communication: Son by phone Disposition Plan: Uncertain   Consultants:   None  Procedures:   None  Antimicrobials:  Remdesivir   Subjective: Feels short of breath but improving steadily. Some diffuse anterior chest discomfort worse with coughing or deep breathing though this is improving as well. No leg swelling noted.  Objective: Vitals:   02/27/19 2055 02/28/19 0020 02/28/19 0430 02/28/19 0736  BP:    113/70  Pulse:    81  Resp:  20 20 (!) 25  Temp: 98.4 F (36.9 C) 97.8 F (36.6 C) 98 F (36.7 C) 98.1 F (36.7 C)  TempSrc: Oral Oral Oral Oral  SpO2:    95%  Weight:      Height:        Intake/Output Summary (Last 24 hours) at 02/28/2019 1131 Last data filed at 02/28/2019 0955 Gross per 24 hour  Intake 1200 ml  Output 550 ml  Net 650 ml   Filed Weights   02/25/19 1852 02/26/19 0500  Weight: 102.1 kg 106.2 kg    Gen: 73 y.o. female in no distress Pulm: Non-labored breathing 4LPM. Crackles bilaterally. No wheezes CV: Regular rate and rhythm. 1st deg AVB on telemetry. No murmur, rub, or gallop. No JVD, no pedal edema. GI: Abdomen soft, non-tender, non-distended, with normoactive bowel sounds. No organomegaly or masses felt. Ext: Warm, no deformities. Neg homan's. Skin: No rashes, lesions or ulcers Neuro: Alert and oriented. No focal neurological deficits. Psych: Judgement and insight appear normal. Mood & affect appropriate.   Data Reviewed: I have personally reviewed following labs and imaging studies  CBC: Recent Labs  Lab 02/25/19 1908 02/27/19 0330 02/28/19 0234  WBC 9.1 11.0* 15.0*  NEUTROABS 7.4 8.9* 12.1*  HGB 11.8* 11.3* 11.2*  HCT 38.5 37.1 36.1  MCV 85.9 86.7 84.7  PLT 379 429* 626*   Basic Metabolic Panel: Recent Labs  Lab 02/25/19 1908 02/27/19 0330 02/28/19 0234  NA 132* 138 140  K 4.6 3.4* 3.6  CL 95* 96* 101  CO2 22 28 29   GLUCOSE 130* 190* 99  BUN 11 17 18   CREATININE 1.02* 0.95 0.73  CALCIUM 7.7* 8.6* 8.2*   GFR: Estimated Creatinine Clearance: 74 mL/min (by C-G formula  based on SCr of 0.73 mg/dL). Liver Function Tests: Recent Labs  Lab 02/25/19 1908 02/27/19 0330 02/28/19 0234  AST 55* 24 18  ALT 32 19 15  ALKPHOS 57 52 57  BILITOT 2.3* 0.8 0.7  PROT 6.5 6.3* 5.9*  ALBUMIN 2.4* 2.2* 2.1*   No results for input(s): LIPASE, AMYLASE in the last 168 hours. No results for input(s): AMMONIA in the last 168 hours. Coagulation Profile: No results for input(s): INR, PROTIME in the last 168 hours. Cardiac Enzymes: No results for input(s): CKTOTAL, CKMB, CKMBINDEX, TROPONINI in the last 168 hours. BNP (last 3 results) No results for input(s): PROBNP in the last 8760 hours. HbA1C: No results for input(s): HGBA1C in the last 72 hours. CBG: No results for input(s): GLUCAP in the last 168 hours. Lipid Profile: Recent Labs    02/25/19 1908  TRIG 107   Thyroid Function Tests: No results for input(s): TSH, T4TOTAL, FREET4, T3FREE, THYROIDAB in the last 72 hours. Anemia Panel: Recent Labs    02/27/19 0330 02/28/19 0234  FERRITIN 328* 263   Urine analysis:    Component Value Date/Time   BILIRUBINUR negative 06/03/2015 1024   PROTEINUR trace 06/03/2015 1024   UROBILINOGEN negative 06/03/2015 1024   NITRITE negative 06/03/2015 1024   LEUKOCYTESUR Trace (A) 06/03/2015 1024   Recent Results (from the past 240 hour(s))  SARS Coronavirus 2 by RT PCR (hospital order, performed in [facility] hospital lab) Nasopharyngeal Nasopharyngeal Swab     Status: Abnormal   Collection Time: 02/25/19  7:08 PM   Specimen: Nasopharyngeal Swab  Result Value Ref Range Status   SARS Coronavirus 2 POSITIVE (A) NEGATIVE Final    Comment: RESULT CALLED TO, READ BACK BY AND VERIFIED WITH: FRICKEY,J AT 2336 ON 02/25/2019 BY JPM (NOTE) If result is NEGATIVE SARS-CoV-2 target nucleic acids are NOT DETECTED. The SARS-CoV-2 RNA is generally detectable in upper and lower  respiratory specimens during the acute phase of infection. The lowest  concentration of SARS-CoV-2  viral copies this assay can detect is 250  copies / mL. A negative result does not preclude SARS-CoV-2 infection  and should not be used as the sole basis for treatment or other  patient management decisions.  A negative result may occur with  improper specimen collection / handling, submission of specimen other  than nasopharyngeal swab, presence of viral mutation(s) within the  areas targeted by this assay, and inadequate number of viral copies  (<250 copies / mL). A negative result must be combined with clinical  observations, patient history, and epidemiological information. If result is POSITIVE SARS-CoV-2 target nucleic acids are DETECTED.  The SARS-CoV-2 RNA is generally detectable in upper and lower  respiratory specimens during the acute phase of infection.  Positive  results are indicative of active infection with SARS-CoV-2.  Clinical  correlation with patient history and other diagnostic information is  necessary to determine patient infection status.  Positive results do  not rule out bacterial infection or co-infection with other viruses. If result is PRESUMPTIVE POSTIVE  SARS-CoV-2 nucleic acids MAY BE PRESENT.   A presumptive positive result was obtained on the submitted specimen  and confirmed on repeat testing.  While 2019 novel coronavirus  (SARS-CoV-2) nucleic acids may be present in the submitted sample  additional confirmatory testing may be necessary for epidemiological  and / or clinical management purposes  to differentiate between  SARS-CoV-2 and other Sarbecovirus currently known to infect humans.  If clinically indicated additional testing with an alternate test  methodology [phone])  is advised. The SARS-CoV-2 RNA is generally  detectable in upper and lower respiratory specimens during the acute  phase of infection. The expected result is Negative. Fact Sheet for Patients:  BoilerBrush.com.cy Fact Sheet for Healthcare Providers:  https://pope.com/ This test is not yet approved or cleared by the [place] FDA and has been authorized for detection and/or diagnosis of SARS-CoV-2 by FDA under an Emergency Use Authorization (EUA).  This EUA will remain in effect (meaning this test can be used) for the duration of the COVID-19 declaration under Section 564(b)(1) of the Act, 21 U.S.C. section 360bbb-3(b)(1), unless the authorization is terminated or revoked sooner. Performed at [facility], [address], [place], [place] [address]   Blood Culture (routine x 2)     Status: None (Preliminary result)   Collection Time: 02/25/19  7:08 PM   Specimen: BLOOD LEFT HAND  Result Value Ref Range Status   Specimen Description   Final    BLOOD LEFT HAND Performed at [facility], [address], [place], [place] [address]    Special Requests   Final    BOTTLES DRAWN AEROBIC AND ANAEROBIC Blood Culture results may not be optimal due to an inadequate volume of blood received in culture bottles Performed at [facility], [address] [place], [place] [address]    Culture   Final    NO GROWTH 1 DAY Performed at [facility], [address] [place], [place] [address]    Report Status PENDING  Incomplete  Blood Culture (routine x 2)     Status: None (Preliminary result)   Collection Time: 02/25/19  7:13 PM   Specimen: BLOOD LEFT FOREARM  Result Value Ref Range Status   Specimen Description   Final    BLOOD LEFT FOREARM Performed at [facility], [address] [place], [place] [address]    Special Requests   Final    BOTTLES DRAWN AEROBIC AND ANAEROBIC Blood Culture results may not be optimal due to an inadequate volume of blood received in culture bottles Performed at [facility], [address], [place], [place] [address]    Culture   Final    NO GROWTH 1 DAY Performed at [facility], [address] [place], [place] [address]    Report Status PENDING  Incomplete  MRSA PCR Screening     Status: None   Collection Time: 02/26/19  6:50 AM   Specimen: Nasal Mucosa; Nasopharyngeal  Result Value Ref Range Status   MRSA by PCR NEGATIVE NEGATIVE Final    Comment:        The GeneXpert MRSA Assay (FDA approved for NASAL specimens only), is one component of a comprehensive MRSA colonization surveillance program. It is not intended to diagnose MRSA infection nor to guide or monitor treatment for MRSA infections. Performed at [facility], [address] [place], [place] [address]       Radiology Studies: No  results found.  Scheduled Meds: . aspirin EC  81 mg Oral Daily  . dexamethasone (DECADRON) injection  6 mg Intravenous Daily  . enoxaparin (LOVENOX) injection  50 mg Subcutaneous QHS  . pantoprazole  40 mg Oral Daily  . rosuvastatin  5 mg Oral Daily   Continuous Infusions: . remdesivir 100 mg in NS 250 mL 100 mg (02/28/19 0904)     LOS: 2 days   Time spent: 35 minutes.  Tyrone Nineyan B Rayann Jolley, MD [facility] [email or web address] Password [email or web address] 02/28/2019, 11:31 AM

## 2019-02-28 NOTE — Progress Notes (Signed)
Provider aware of score. Will continur to monitor

## 2019-03-01 ENCOUNTER — Inpatient Hospital Stay (HOSPITAL_COMMUNITY): Payer: Medicare Other

## 2019-03-01 DIAGNOSIS — R7989 Other specified abnormal findings of blood chemistry: Secondary | ICD-10-CM

## 2019-03-01 LAB — CBC WITH DIFFERENTIAL/PLATELET
Abs Immature Granulocytes: 0.24 10*3/uL — ABNORMAL HIGH (ref 0.00–0.07)
Basophils Absolute: 0 10*3/uL (ref 0.0–0.1)
Basophils Relative: 0 %
Eosinophils Absolute: 0.1 10*3/uL (ref 0.0–0.5)
Eosinophils Relative: 1 %
HCT: 36 % (ref 36.0–46.0)
Hemoglobin: 11.2 g/dL — ABNORMAL LOW (ref 12.0–15.0)
Immature Granulocytes: 2 %
Lymphocytes Relative: 21 %
Lymphs Abs: 2.8 10*3/uL (ref 0.7–4.0)
MCH: 26.6 pg (ref 26.0–34.0)
MCHC: 31.1 g/dL (ref 30.0–36.0)
MCV: 85.5 fL (ref 80.0–100.0)
Monocytes Absolute: 0.8 10*3/uL (ref 0.1–1.0)
Monocytes Relative: 6 %
Neutro Abs: 9.1 10*3/uL — ABNORMAL HIGH (ref 1.7–7.7)
Neutrophils Relative %: 70 %
Platelets: 418 10*3/uL — ABNORMAL HIGH (ref 150–400)
RBC: 4.21 MIL/uL (ref 3.87–5.11)
RDW: 14.9 % (ref 11.5–15.5)
WBC: 13 10*3/uL — ABNORMAL HIGH (ref 4.0–10.5)
nRBC: 0.2 % (ref 0.0–0.2)

## 2019-03-01 LAB — COMPREHENSIVE METABOLIC PANEL
ALT: 19 U/L (ref 0–44)
AST: 22 U/L (ref 15–41)
Albumin: 2.1 g/dL — ABNORMAL LOW (ref 3.5–5.0)
Alkaline Phosphatase: 58 U/L (ref 38–126)
Anion gap: 11 (ref 5–15)
BUN: 17 mg/dL (ref 8–23)
CO2: 26 mmol/L (ref 22–32)
Calcium: 8.2 mg/dL — ABNORMAL LOW (ref 8.9–10.3)
Chloride: 102 mmol/L (ref 98–111)
Creatinine, Ser: 0.75 mg/dL (ref 0.44–1.00)
GFR calc Af Amer: >60 mL/min (ref >60)
GFR calc non Af Amer: >60 mL/min (ref >60)
Glucose, Bld: 103 mg/dL — ABNORMAL HIGH (ref 70–99)
Potassium: 3.5 mmol/L (ref 3.5–5.1)
Sodium: 139 mmol/L (ref 135–145)
Total Bilirubin: 0.5 mg/dL (ref 0.3–1.2)
Total Protein: 5.8 g/dL — ABNORMAL LOW (ref 6.5–8.1)

## 2019-03-01 LAB — C-REACTIVE PROTEIN: CRP: 8.8 mg/dL — ABNORMAL HIGH (ref <1.0)

## 2019-03-01 LAB — D-DIMER, QUANTITATIVE: D-Dimer, Quant: 5.95 ug/mL-FEU — ABNORMAL HIGH (ref 0.00–0.50)

## 2019-03-01 LAB — FERRITIN: Ferritin: 191 ng/mL (ref 11–307)

## 2019-03-01 MED ORDER — ENOXAPARIN SODIUM 60 MG/0.6ML ~~LOC~~ SOLN: 0.5000 mg/kg | Freq: Two times a day (BID) | SUBCUTANEOUS | Status: DC

## 2019-03-01 MED ORDER — ENOXAPARIN SODIUM 120 MG/0.8ML ~~LOC~~ SOLN
1.0000 mg/kg | Freq: Two times a day (BID) | SUBCUTANEOUS | Status: DC
  Filled 2019-03-01 (×2): qty 0.8

## 2019-03-01 MED ORDER — ENOXAPARIN SODIUM 60 MG/0.6ML ~~LOC~~ SOLN
0.5000 mg/kg | Freq: Two times a day (BID) | SUBCUTANEOUS | Status: DC
  Administered 2019-03-02 – 2019-03-05 (×7): 55 mg via SUBCUTANEOUS
  Filled 2019-03-01 (×7): qty 0.6

## 2019-03-01 MED ORDER — ENOXAPARIN SODIUM 60 MG/0.6ML ~~LOC~~ SOLN
0.5000 mg/kg | Freq: Two times a day (BID) | SUBCUTANEOUS | Status: DC
  Administered 2019-03-01: 55 mg via SUBCUTANEOUS
  Filled 2019-03-01: qty 0.6

## 2019-03-01 MED ORDER — ENOXAPARIN SODIUM 60 MG/0.6ML ~~LOC~~ SOLN
50.0000 mg | Freq: Once | SUBCUTANEOUS | Status: AC
  Administered 2019-03-01: 50 mg via SUBCUTANEOUS
  Filled 2019-03-01: qty 0.6

## 2019-03-01 MED ORDER — IOHEXOL 350 MG/ML SOLN
100.0000 mL | Freq: Once | INTRAVENOUS | Status: AC | PRN
  Administered 2019-03-01: 100 mL via INTRAVENOUS

## 2019-03-01 NOTE — Progress Notes (Signed)
ANTICOAGULATION CONSULT NOTE - Initial Consult  Pharmacy Consult for Lovenox  Indication: pulmonary embolus  Allergies  Allergen Reactions  . Shellfish-Derived Products Other (See Comments)    Shrimp and lobster--severe swelling/itching  . Codeine Nausea And Vomiting  . Tetracyclines & Related Nausea And Vomiting  . Vibramycin [Doxycycline Calcium] Nausea And Vomiting    Patient Measurements: Height: 5' 4.5" (163.8 cm) Weight: 234 lb 2.1 oz (106.2 kg) IBW/kg (Calculated) : 55.85  Vital Signs: Temp: 98.3 F (36.8 C) (10/15 1146) Temp Source: Oral (10/15 1146) BP: 125/71 (10/15 1146) Pulse Rate: 96 (10/15 1146)  Labs: Recent Labs    02/27/19 0330 02/28/19 0234 03/01/19 0255  HGB 11.3* 11.2* 11.2*  HCT 37.1 36.1 36.0  PLT 429* 414* 418*  CREATININE 0.95 0.73 0.75    Estimated Creatinine Clearance: 74 mL/min (by C-G formula based on SCr of 0.75 mg/dL).   Medical History: Past Medical History:  Diagnosis Date  . Hypertension     Assessment: 73 y/o F with COVID-19 PNA on Lovenox for DVT prophylaxis now with PE. Last dose of Lovenox 0.5 mg/kg 55 mg at 1000.   Goal of Therapy:  Anti-Xa level 0.6-1 units/ml 4hrs after LMWH dose given Monitor platelets by anticoagulation protocol: Yes   Plan:  Lovenox 50 mg once then 105 mg bid.  F/U SCr, CBC, level if indicated  Ulice Dash D 03/01/2019,2:46 PM

## 2019-03-01 NOTE — TOC Initial Note (Signed)
Transition of Care [facility]) - Initial/Assessment Note    Patient Details  Name: [name] MRN: [id number] Date of Birth: 08/07/1945  Transition of Care [facility]) CM/SW Contact:    Ninfa Meeker, RN Phone Number: 03/01/2019, 2:50 PM  Clinical Narrative:   Patient is a 73 y.o. female with a history of HTN, dyslipidemia, and GERD who presented to the ED 10/11 by EMS when she appeared to be confused speaking on the phone. EMS reported hypoxia to 73%, placed on 15L NRB and was found to have positive SARS-CoV-2 testing with bilateral interstitial infiltrates.                         Patient Goals and CMS Choice        Expected Discharge Plan and Services                                                Prior Living Arrangements/Services                       Activities of Daily Living Home Assistive Devices/Equipment: Eyeglasses ADL Screening (condition at time of admission) Patient's cognitive ability adequate to safely complete daily activities?: Yes Is the patient deaf or have difficulty hearing?: No Does the patient have difficulty seeing, even when wearing glasses/contacts?: No Does the patient have difficulty concentrating, remembering, or making decisions?: No Patient able to express need for assistance with ADLs?: Yes Does the patient have difficulty dressing or bathing?: No Independently performs ADLs?: Yes (appropriate for developmental age) Does the patient have difficulty walking or climbing stairs?: No Weakness of Legs: None Weakness of Arms/Hands: None  Permission Sought/Granted                  Emotional Assessment              Admission diagnosis:  Acute respiratory failure with hypoxia ([place]) [J96.01] COVID-19 virus infection [U07.1] Patient Active Problem List   Diagnosis Date Noted  . Pneumonia due to COVID-19 virus 02/26/2019  . Respiratory failure (HCC)/ hypoxic 02/26/2019  . Essential hypertension 02/26/2019  .  Dyslipidemia 02/26/2019  . GERD (gastroesophageal reflux disease) 02/26/2019  . Bunion 08/21/2014  . 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 Onychomycosis 02/28/2013  . Pain, foot 02/28/2013   PCP:  Vincente Liberty, MD Pharmacy:   [facility] [place], [place] - [place] [facility] [place] [address] [place] [address] Phone: [phone] Fax: [phone]     Social Determinants of Health (SDOH) Interventions    Readmission Risk Interventions No flowsheet data found.

## 2019-03-01 NOTE — Evaluation (Addendum)
Occupational Therapy Evaluation Patient Details Name: [name] MRN: [id number] DOB: 08-12-1945 Today's Date: 03/01/2019    History of Present Illness 73 y/o female w/ hx of HTN, GERD, dyslipidemia, presented w/ confusion and hypoxemia   Clinical Impression   This 73 y/o female presents with the above. PTA pt living alone, reports independence with ADL and mobility but does endorse increased effort/difficulty with bathing and LB ADL, was driving. Pt presenting with impaired balance, decreased respiratory status and activity tolerance impacting her functional performance. Pt currently requiring minA for functional transfers using RW, requiring minguard assist for standing grooming ADL, modA for LB ADL. Pt on 4L O2 during session, SpO2 decreased to 82% with RR high 30s post activity once seated in recliner with cues provided for pursed lip breathing, SpO2 increased to 90% within approx 1-2 min. Pt also with elevated HR (max noted 130) with standing activity, decreased to 110s with seated rest and 102-104 end of session seated in recliner.   Pt will benefit from continued acute OT services; pt reports her family lives out of town and with decreased caregiver support at time of discharge. Feel pt may have a difficult time managing on her own at time of discharge; given lack of available caregiver assist and current deficits recommend follow up therapies [facility] prior to return home to progress pt towards her PLOF. Will follow.     Follow Up Recommendations  SNF;Supervision/Assistance - 24 hour(SNF vs 24hr)    Equipment Recommendations  Tub/shower bench;3 in 1 bedside commode;Other (comment)(To be further assessed)           Precautions / Restrictions Precautions Precautions: Fall Restrictions Weight Bearing Restrictions: No      Mobility Bed Mobility Overal bed mobility: Needs Assistance Bed Mobility: Supine to Sit     Supine to sit: Min guard     General bed mobility  comments: for lines, safety, increased effort but no physical assist required  Transfers Overall transfer level: Needs assistance Equipment used: Rolling walker (2 wheeled) Transfers: Sit to/from [employer] Sit to Stand: Min guard Stand pivot transfers: Min assist       General transfer comment: pt request to attempt to stand on her own, required increased time/effort but able to do so with minguard assist; minA provided for taking few steps in room and transfer to recliner due to decreased balance     Balance Overall balance assessment: Needs assistance Sitting-balance support: Feet supported Sitting balance-Leahy Scale: Good     Standing balance support: Single extremity supported Standing balance-Leahy Scale: Fair                             ADL either performed or assessed with clinical judgement   ADL Overall ADL's : Needs assistance/impaired Eating/Feeding: Modified independent;Sitting   Grooming: Oral care;Min guard;Standing   Upper Body Bathing: Min guard;Sitting   Lower Body Bathing: Moderate assistance;Sit to/from stand   Upper Body Dressing : Set up;Min guard;Sitting   Lower Body Dressing: Moderate assistance;Sit to/from stand Lower Body Dressing Details (indicate cue type and reason): minguard assist for standing balance; pt reports feeling like she can't reach her LEs for LB dressing ADL  Toilet Transfer: Minimal assistance;Stand-pivot;RW Toilet Transfer Details (indicate cue type and reason): simulated via transfer to recliner Toileting- Clothing Manipulation and Hygiene: Moderate assistance;Sit to/from stand       Functional mobility during ADLs: Minimal assistance;Rolling walker General ADL Comments: pt with  weakness, increased WOB with activity, decreased functional status     Vision         Perception     Praxis      Pertinent Vitals/Pain Pain Assessment: No/denies pain     Hand Dominance     Extremity/Trunk  Assessment Upper Extremity Assessment Upper Extremity Assessment: Generalized weakness   Lower Extremity Assessment Lower Extremity Assessment: Defer to PT evaluation   Cervical / Trunk Assessment Cervical / Trunk Assessment: Normal   Communication Communication Communication: No difficulties   Cognition Arousal/Alertness: Awake/alert Behavior During Therapy: WFL for tasks assessed/performed Overall Cognitive Status: Within Functional Limits for tasks assessed                                     General Comments       Exercises     Shoulder Instructions      Home Living Family/patient expects to be discharged to:: Private residence Living Arrangements: Alone Available Help at Discharge: Other (Comment)(reports no family close by, son in [place]) Type of Home: Apartment Home Access: Level entry     Home Layout: One level     Bathroom Shower/Tub: Teacher, early years/pre: Standard     Home Equipment: (has RW but does not use prior to hospitalization)          Prior Functioning/Environment Level of Independence: Independent        Comments: was I prior to this living home alone, driving, reports increased difficulty with performing LB ADL tasks, was standing in the shower but was effortful         OT Problem List: Decreased strength;Decreased range of motion;Decreased activity tolerance;Impaired balance (sitting and/or standing);Decreased knowledge of use of DME or AE;Cardiopulmonary status limiting activity;Obesity      OT Treatment/Interventions: Self-care/ADL training;Therapeutic exercise;Energy conservation;DME and/or AE instruction;Therapeutic activities;Cognitive remediation/compensation;Patient/family education;Balance training    OT Goals(Current goals can be found in the care plan section) Acute Rehab OT Goals Patient Stated Goal: go [facility] at [facility] OT Goal Formulation: With patient Time For Goal Achievement:  03/15/19 Potential to Achieve Goals: Good  OT Frequency: Min 2X/week(need to follow to ensure pt not going to return home)   Barriers to D/C: Decreased caregiver support(son lives out of state)          Co-evaluation              AM-PAC OT "6 Clicks" Daily Activity     Outcome Measure Help from another person eating meals?: None Help from another person taking care of personal grooming?: A Little Help from another person toileting, which includes using toliet, bedpan, or urinal?: A Lot Help from another person bathing (including washing, rinsing, drying)?: A Lot Help from another person to put on and taking off regular upper body clothing?: None Help from another person to put on and taking off regular lower body clothing?: A Lot 6 Click Score: 17   End of Session Equipment Utilized During Treatment: Rolling walker;Oxygen(4L) Nurse Communication: Mobility status  Activity Tolerance: Patient tolerated treatment well Patient left: in chair;with call bell/phone within reach  OT Visit Diagnosis: Unsteadiness on feet (R26.81);Muscle weakness (generalized) (M62.81)                Time: 8675-4492 OT Time Calculation (min): 32 min Charges:  OT General Charges $OT Visit: 1 Visit OT Evaluation $OT Eval Moderate Complexity: 1 Mod OT Treatments $Self Care/Home  Management : 8-22 mins  Marcy SirenBreanna Eirene Rather, OT Supplemental Rehabilitation Services Pager [phone] Office [phone]   Orlando PennerBreanna L Johnda Billiot 03/01/2019, 11:03 AM

## 2019-03-01 NOTE — Progress Notes (Signed)
Bilateral lower extremity venous duplex has been completed. Preliminary results can be found in CV Proc through chart review.   03/01/19 12:54 PM Carlos Levering RVT

## 2019-03-01 NOTE — Plan of Care (Signed)

## 2019-03-01 NOTE — Progress Notes (Signed)
PROGRESS NOTE  [name]  UVO:[id number] DOB: 03/29/1946 DOA: 02/25/2019 PCP: Vincente Liberty, MD   Brief Narrative: [name] is a 73 y.o. female with a history of HTN, dyslipidemia, and GERD who presented to the ED 10/11 by EMS when she appeared to be confused speaking on the phone. EMS reported hypoxia to 73%, placed on 15L NRB and was found to have positive SARS-CoV-2 testing with bilateral interstitial infiltrates.   Assessment & Plan: Principal Problem:   Respiratory failure (HCC)/ hypoxic Active Problems:   Pneumonia due to COVID-19 virus   Essential hypertension   Dyslipidemia   GERD (gastroesophageal reflux disease)  Acute hypoxic respiratory failure due to covid-19 pneumonia: Persistently hypoxic though improving generally.  - Wean oxygen as tolerated, currently stable on 4LPM - CRP declining 24.7 > 8.9, pending this AM and ferritin has normalized. Despite this improvement, d-dimer is rising and pt has developed left calf pain. Will check LE compressive U/S and CTA chest given her continued borderline tachycardia and hypoxia. - Continue airborne, contact precautions. PPE including surgical gown, gloves, cap, shoe covers, and CAPR used during this encounter in a negative pressure room.  - Check daily labs: CBC w/diff, CMP, d-dimer, ferritin, CRP - Recommend proning and aggressive use of incentive spirometry.  AKI:  - Avoid nephrotoxins and trend metabolic panel  Hypokalemia: Resolved. - Monitor and supplement as indicated  HTN:  - Hold antihypertensive with softer BPs  LFT elevation: - Resolved, but will continue monitoring with remdesivir Tx  Acute metabolic encephalopathy due to covid-19 infection: Improving.   Obesity: BMI 39.  - Noted  DVT prophylaxis: Lovenox 0.5mg /kg q12h Code Status: Full Family Communication: Son by phone daily Disposition Plan: Uncertain. Son is concerned about patient's fall risk and concerned she may need SNF rehabilitation.  PT has recommended home health, OT consult placed. Will continue to monitor mobility. Pt reluctant to go anywhere but home at discharge.  Consultants:   None  Procedures:   None  Antimicrobials:  Remdesivir (10/12 - 10/16)  Subjective: Stable dyspnea is moderate, no significant chest pain, not sure of leg swelling, no hx blood clots.    Objective: Vitals:   02/28/19 1137 02/28/19 1639 02/28/19 1939 03/01/19 0743  BP: 114/63 106/71 120/75 133/77  Pulse:   86 88  Resp:   (!) 27 20  Temp: 98.7 F (37.1 C) 98.4 F (36.9 C) 98.8 F (37.1 C) 98.1 F (36.7 C)  TempSrc: Oral Oral Oral Oral  SpO2: 94%  91% 93%  Weight:      Height:        Intake/Output Summary (Last 24 hours) at 03/01/2019 7425 Last data filed at 02/28/2019 1939 Gross per 24 hour  Intake 1570 ml  Output 800 ml  Net 770 ml   Filed Weights   02/25/19 1852 02/26/19 0500  Weight: 102.1 kg 106.2 kg   Gen: Pleasant oese female in no distress Pulm: Nonlabored breathing supplemental oxygen, tachypneic when speaking in longer sentences. Crackles without wheezes, stable. CV: Regular rate and rhythm. No murmur, rub, or gallop. No JVD, trace L > R dependent edema. GI: Abdomen soft, non-tender, non-distended, with normoactive bowel sounds.  Ext: Warm, no deformities. Neg homan's bilaterally, Positive pain with calf compression on left.  Skin: No rashes, lesions or ulcers on visualized skin. Neuro: Alert and oriented. No focal neurological deficits. Psych: Judgement and insight appear fair. Mood euthymic & affect congruent. Behavior is appropriate.    Data Reviewed: I have personally reviewed following  labs and imaging studies  CBC: Recent Labs  Lab 02/25/19 1908 02/27/19 0330 02/28/19 0234 03/01/19 0255  WBC 9.1 11.0* 15.0* 13.0*  NEUTROABS 7.4 8.9* 12.1* 9.1*  HGB 11.8* 11.3* 11.2* 11.2*  HCT 38.5 37.1 36.1 36.0  MCV 85.9 86.7 84.7 85.5  PLT 379 429* 414* 418*   Basic Metabolic Panel: Recent Labs   Lab 02/25/19 1908 02/27/19 0330 02/28/19 0234 03/01/19 0255  NA 132* 138 140 139  K 4.6 3.4* 3.6 3.5  CL 95* 96* 101 102  CO2 GLUCOSE 130* 190* 99 103*  BUN CREATININE 1.02* 0.95 0.73 0.75  CALCIUM 7.7* 8.6* 8.2* 8.2*   Liver Function Tests: Recent Labs  Lab 02/25/19 1908 02/27/19 0330 02/28/19 0234 03/01/19 0255  AST 55* ALT 32 ALKPHOS 57 52 57 58  BILITOT 2.3* 0.8 0.7 0.5  PROT 6.5 6.3* 5.9* 5.8*  ALBUMIN 2.4* 2.2* 2.1* 2.1*   Anemia Panel: Recent Labs    02/27/19 0330 02/28/19 0234  FERRITIN 328* 263   Urine analysis:    Component Value Date/Time   BILIRUBINUR negative 06/03/2015 1024   PROTEINUR trace 06/03/2015 1024   UROBILINOGEN negative 06/03/2015 1024   NITRITE negative 06/03/2015 1024   LEUKOCYTESUR Trace (A) 06/03/2015 1024   Recent Results (from the past 240 hour(s))  SARS Coronavirus 2 by RT PCR (hospital order, performed in [facility] hospital lab) Nasopharyngeal Nasopharyngeal Swab     Status: Abnormal   Collection Time: 02/25/19  7:08 PM   Specimen: Nasopharyngeal Swab  Result Value Ref Range Status   SARS Coronavirus 2 POSITIVE (A) NEGATIVE Final    Comment: RESULT CALLED TO, READ BACK BY AND VERIFIED WITH: FRICKEY,J AT 2336 ON 02/25/2019 BY JPM (NOTE) If result is NEGATIVE SARS-CoV-2 target nucleic acids are NOT DETECTED. The SARS-CoV-2 RNA is generally detectable in upper and lower  respiratory specimens during the acute phase of infection. The lowest  concentration of SARS-CoV-2 viral copies this assay can detect is 250  copies / mL. A negative result does not preclude SARS-CoV-2 infection  and should not be used as the sole basis for treatment or other  patient management decisions.  A negative result may occur with  improper specimen collection / handling, submission of specimen other  than nasopharyngeal swab, presence of viral mutation(s) within the  areas targeted by this assay, and  inadequate number of viral copies  (<250 copies / mL). A negative result must be combined with clinical  observations, patient history, and epidemiological information. If result is POSITIVE SARS-CoV-2 target nucleic acids are DETECTED.  The SARS-CoV-2 RNA is generally detectable in upper and lower  respiratory specimens during the acute phase of infection.  Positive  results are indicative of active infection with SARS-CoV-2.  Clinical  correlation with patient history and other diagnostic information is  necessary to determine patient infection status.  Positive results do  not rule out bacterial infection or co-infection with other viruses. If result is PRESUMPTIVE POSTIVE SARS-CoV-2 nucleic acids MAY BE PRESENT.   A presumptive positive result was obtained on the submitted specimen  and confirmed on repeat testing.  While 2019 novel coronavirus  (SARS-CoV-2) nucleic acids may be present in the submitted sample  additional confirmatory testing may be necessary for epidemiological  and / or clinical management purposes  to differentiate between  SARS-CoV-2 and other Sarbecovirus currently known to infect humans.  If clinically  indicated additional testing with an alternate test  methodology [phone]([id number])  is advised. The SARS-CoV-2 RNA is generally  detectable in upper and lower respiratory specimens during the acute  phase of infection. The expected result is Negative. Fact Sheet for Patients:  BoilerBrush.com.cyhttps://www.fda.gov/media/136312/download Fact Sheet for Healthcare Providers: https://pope.com/https://www.fda.gov/media/136313/download This test is not yet approved or cleared by the [place] FDA and has been authorized for detection and/or diagnosis of SARS-CoV-2 by FDA under an Emergency Use Authorization (EUA).  This EUA will remain in effect (meaning this test can be used) for the duration of the COVID-19 declaration under Section 564(b)(1) of the Act, 21 U.S.C. section 360bbb-3(b)(1), unless the  authorization is terminated or revoked sooner. Performed at [facility], [address] [place], [place] [address]   Blood Culture (routine x 2)     Status: None (Preliminary result)   Collection Time: 02/25/19  7:08 PM   Specimen: BLOOD LEFT HAND  Result Value Ref Range Status   Specimen Description   Final    BLOOD LEFT HAND Performed at [facility], [address] [place], [place] [address]    Special Requests   Final    BOTTLES DRAWN AEROBIC AND ANAEROBIC Blood Culture results may not be optimal due to an inadequate volume of blood received in culture bottles Performed at [facility], [address] [place], [place] [address]    Culture   Final    NO GROWTH 3 DAYS Performed at [facility], [address] [place], [place] [address]    Report Status PENDING  Incomplete  Blood Culture (routine x 2)     Status: None (Preliminary result)   Collection Time: 02/25/19  7:13 PM   Specimen: BLOOD LEFT FOREARM  Result Value Ref Range Status   Specimen Description   Final    BLOOD LEFT FOREARM Performed at [facility], [address] [place], [place] [address]    Special Requests   Final    BOTTLES DRAWN AEROBIC AND ANAEROBIC Blood Culture results may not be optimal due to an inadequate volume of blood received in culture bottles Performed at [facility], [address] [place], [place] [address]    Culture   Final    NO GROWTH 3 DAYS Performed at [facility], [address] [place], [place] [address]    Report Status PENDING  Incomplete  MRSA PCR Screening     Status: None   Collection Time: 02/26/19  6:50 AM   Specimen: Nasal Mucosa; Nasopharyngeal  Result Value Ref Range Status   MRSA by PCR NEGATIVE NEGATIVE Final    Comment:        The GeneXpert MRSA Assay (FDA approved for NASAL specimens only), is one component of a comprehensive MRSA colonization  surveillance program. It is not intended to diagnose MRSA infection nor to guide or monitor treatment for MRSA infections. Performed at [facility], [address] [place], [place] [address]       Radiology Studies: No results found.  Scheduled Meds: . aspirin EC  81 mg Oral Daily  . dexamethasone (DECADRON) injection  6 mg Intravenous Daily  . enoxaparin (LOVENOX) injection  0.5 mg/kg Subcutaneous Q12H  . pantoprazole  40 mg Oral Daily  . rosuvastatin  5 mg Oral Daily   Continuous Infusions: . remdesivir 100 mg in NS 250 mL Stopped (02/28/19 0934)     LOS: 3 days  Time spent: 35 minutes.  Tyrone Nine, MD [facility] [email or web address] Password [email or web address] 03/01/2019, 9:06 AM

## 2019-03-01 NOTE — Progress Notes (Signed)
Called and spoke with the patient's son Zane Herald. I updated him on the patient's condition, vitals, and labs today. I informed him of the chest CT and venous duplex ordered for today. He requested that before discharge the patient be re-tested for COVID. I explained that I will pass along his concern to the physician.

## 2019-03-02 LAB — COMPREHENSIVE METABOLIC PANEL
ALT: 19 U/L (ref 0–44)
AST: 17 U/L (ref 15–41)
Albumin: 2.1 g/dL — ABNORMAL LOW (ref 3.5–5.0)
Alkaline Phosphatase: 71 U/L (ref 38–126)
Anion gap: 11 (ref 5–15)
BUN: 17 mg/dL (ref 8–23)
CO2: 26 mmol/L (ref 22–32)
Calcium: 8.2 mg/dL — ABNORMAL LOW (ref 8.9–10.3)
Chloride: 103 mmol/L (ref 98–111)
Creatinine, Ser: 0.73 mg/dL (ref 0.44–1.00)
GFR calc Af Amer: >60 mL/min (ref >60)
GFR calc non Af Amer: >60 mL/min (ref >60)
Glucose, Bld: 165 mg/dL — ABNORMAL HIGH (ref 70–99)
Potassium: 3.5 mmol/L (ref 3.5–5.1)
Sodium: 140 mmol/L (ref 135–145)
Total Bilirubin: 0.2 mg/dL — ABNORMAL LOW (ref 0.3–1.2)
Total Protein: 5.9 g/dL — ABNORMAL LOW (ref 6.5–8.1)

## 2019-03-02 LAB — CBC WITH DIFFERENTIAL/PLATELET
Abs Immature Granulocytes: 0.28 10*3/uL — ABNORMAL HIGH (ref 0.00–0.07)
Basophils Absolute: 0 10*3/uL (ref 0.0–0.1)
Basophils Relative: 0 %
Eosinophils Absolute: 0 10*3/uL (ref 0.0–0.5)
Eosinophils Relative: 0 %
HCT: 37 % (ref 36.0–46.0)
Hemoglobin: 11.1 g/dL — ABNORMAL LOW (ref 12.0–15.0)
Immature Granulocytes: 2 %
Lymphocytes Relative: 22 %
Lymphs Abs: 2.8 10*3/uL (ref 0.7–4.0)
MCH: 25.8 pg — ABNORMAL LOW (ref 26.0–34.0)
MCHC: 30 g/dL (ref 30.0–36.0)
MCV: 85.8 fL (ref 80.0–100.0)
Monocytes Absolute: 0.7 10*3/uL (ref 0.1–1.0)
Monocytes Relative: 6 %
Neutro Abs: 8.8 10*3/uL — ABNORMAL HIGH (ref 1.7–7.7)
Neutrophils Relative %: 70 %
Platelets: 449 10*3/uL — ABNORMAL HIGH (ref 150–400)
RBC: 4.31 MIL/uL (ref 3.87–5.11)
RDW: 15.1 % (ref 11.5–15.5)
WBC: 12.6 10*3/uL — ABNORMAL HIGH (ref 4.0–10.5)
nRBC: 0.2 % (ref 0.0–0.2)

## 2019-03-02 LAB — FERRITIN: Ferritin: 153 ng/mL (ref 11–307)

## 2019-03-02 LAB — C-REACTIVE PROTEIN: CRP: 10.5 mg/dL — ABNORMAL HIGH (ref <1.0)

## 2019-03-02 LAB — D-DIMER, QUANTITATIVE: D-Dimer, Quant: 2.97 ug/mL-FEU — ABNORMAL HIGH (ref 0.00–0.50)

## 2019-03-02 NOTE — Progress Notes (Signed)
PROGRESS NOTE  [name]  WUJ:[id number] DOB: Aug 28, 1945 DOA: 02/25/2019 PCP: Corine Shelter, MD   Brief Narrative: [name] is a 73 y.o. female with a history of HTN, dyslipidemia, and GERD who presented to the ED 10/11 by EMS when she appeared to be confused speaking on the phone. EMS reported hypoxia to 73%, placed on 15L NRB and was found to have positive SARS-CoV-2 testing with bilateral interstitial infiltrates. Oxygen was weaned to 4L O2 with remdesivir and steroids provided.  Assessment & Plan: Principal Problem:   Respiratory failure (HCC)/ hypoxic Active Problems:   Pneumonia due to COVID-19 virus   Essential hypertension   Dyslipidemia   GERD (gastroesophageal reflux disease)  Acute hypoxic respiratory failure due to covid-19 pneumonia: Persistently hypoxic though improving generally.  - Wean oxygen as tolerated, currently stable on 4LPM - CRP declining overall, but remains elevated, slightly up today, in fact. D-dimer now declining. LE U/S and CTA chest ruled out thrombosis/PE. CTA chest also demonstrated typical GGO's without lobar consolidation, pleural effusions, or pulmonary edema.  - Continue airborne, contact precautions. PPE including surgical gown, gloves, cap, shoe covers, and CAPR used during this encounter in a negative pressure room.  - Check daily labs: CBC w/diff, CMP, d-dimer, ferritin, CRP.   - Recommend proning and aggressive use of incentive spirometry.  AKI:  - Avoid nephrotoxins and trend metabolic panel  Hypokalemia: Resolved. - Monitor and supplement as indicated  HTN:  - Hold antihypertensive with softer BPs  LFT elevation: - Resolved, but will continue monitoring with remdesivir Tx  Acute metabolic encephalopathy due to covid-19 infection: Improving.   Obesity: BMI 39.  - Noted  DVT prophylaxis: Lovenox 0.5mg /kg q12h Code Status: Full Family Communication: Son by phone daily Disposition Plan: Uncertain. Son is concerned  about patient's fall risk and concerned she may need SNF rehabilitation. PT has reported improvement in serial evaluations, recommending home health at discharge. OT recommended SNF. Will need continued evaluations as she clinically progresses.  Consultants:   None  Procedures:   None  Antimicrobials:  Remdesivir (10/12 - 10/16)  Subjective: Feels shortness of breath is improved today compared to previous days, no shortness of breath when getting from bed to chair. Seems to have improved mobility with PT today.   Objective: Vitals:   03/01/19 1642 03/01/19 2028 03/02/19 0422 03/02/19 0735  BP: 113/74 (!) 144/51 133/82 (!) 142/88  Pulse: 94 90  78  Resp: 17 20  (!) 29  Temp: 98.6 F (37 C) 97.6 F (36.4 C) 98.8 F (37.1 C) 98.1 F (36.7 C)  TempSrc: Oral Oral Oral Oral  SpO2: 95% 91%  96%  Weight:      Height:        Intake/Output Summary (Last 24 hours) at 03/02/2019 1518 Last data filed at 03/02/2019 0800 Gross per 24 hour  Intake 600 ml  Output 250 ml  Net 350 ml   Filed Weights   02/25/19 1852 02/26/19 0500  Weight: 102.1 kg 106.2 kg   Gen: 73 y.o. female in no distress Pulm: Nonlabored breathing 4LPM supplemental oxygen. Tachypneic. +Crackles. CV: Regular rate and rhythm. No murmur, rub, or gallop. No JVD, no dependent edema. GI: Abdomen soft, non-tender, non-distended, with normoactive bowel sounds.  Ext: Warm, no deformities Skin: No rashes, lesions or ulcers on visualized skin. Neuro: Alert and oriented. No focal neurological deficits. Psych: Judgement and insight appear fair. Mood euthymic & affect congruent. Behavior is appropriate.    Data Reviewed: I have personally  reviewed following labs and imaging studies  CBC: Recent Labs  Lab 02/25/19 1908 02/27/19 0330 02/28/19 0234 03/01/19 0255 03/02/19 0227  WBC 9.1 11.0* 15.0* 13.0* 12.6*  NEUTROABS 7.4 8.9* 12.1* 9.1* 8.8*  HGB 11.8* 11.3* 11.2* 11.2* 11.1*  HCT 38.5 37.1 36.1 36.0 37.0  MCV  85.9 86.7 84.7 85.5 85.8  PLT 379 429* 414* 418* 449*   Basic Metabolic Panel: Recent Labs  Lab 02/25/19 1908 02/27/19 0330 02/28/19 0234 03/01/19 0255 03/02/19 0227  NA 132* 138 140 139 140  K 4.6 3.4* 3.6 3.5 3.5  CL 95* 96* 101 102 103  CO2 22 28 29 26 26   GLUCOSE 130* 190* 99 103* 165*  BUN 11 17 18 17 17   CREATININE 1.02* 0.95 0.73 0.75 0.73  CALCIUM 7.7* 8.6* 8.2* 8.2* 8.2*   Liver Function Tests: Recent Labs  Lab 02/25/19 1908 02/27/19 0330 02/28/19 0234 03/01/19 0255 03/02/19 0227  AST 55* 24 18 22 17   ALT 32 19 15 19 19   ALKPHOS 57 52 57 58 71  BILITOT 2.3* 0.8 0.7 0.5 0.2*  PROT 6.5 6.3* 5.9* 5.8* 5.9*  ALBUMIN 2.4* 2.2* 2.1* 2.1* 2.1*   Anemia Panel: Recent Labs    03/01/19 0255 03/02/19 0227  FERRITIN 191 153   Urine analysis:    Component Value Date/Time   BILIRUBINUR negative 06/03/2015 1024   PROTEINUR trace 06/03/2015 1024   UROBILINOGEN negative 06/03/2015 1024   NITRITE negative 06/03/2015 1024   LEUKOCYTESUR Trace (A) 06/03/2015 1024   Recent Results (from the past 240 hour(s))  SARS Coronavirus 2 by RT PCR (hospital order, [facility]) Nasopharyngeal Nasopharyngeal Swab     Status: Abnormal   Collection Time: 02/25/19  7:08 PM   Specimen: Nasopharyngeal Swab  Result Value Ref Range Status   SARS Coronavirus 2 POSITIVE (A) NEGATIVE Final    Comment: RESULT CALLED TO, READ BACK BY AND VERIFIED WITH: FRICKEY,J AT 2336 ON 02/25/2019 BY JPM (NOTE) If result is NEGATIVE SARS-CoV-2 target nucleic acids are NOT DETECTED. The SARS-CoV-2 RNA is generally detectable in upper and lower  respiratory specimens during the acute phase of infection. The lowest  concentration of SARS-CoV-2 viral copies this assay can detect is 250  copies / mL. A negative result does not preclude SARS-CoV-2 infection  and should not be used as the sole basis for treatment or other  patient management decisions.  A negative result may occur  with  improper specimen collection / handling, submission of specimen other  than nasopharyngeal swab, presence of viral mutation(s) within the  areas targeted by this assay, and inadequate number of viral copies  (<250 copies / mL). A negative result must be combined with clinical  observations, patient history, and epidemiological information. If result is POSITIVE SARS-CoV-2 target nucleic acids are DETECTED.  The SARS-CoV-2 RNA is generally detectable in upper and lower  respiratory specimens during the acute phase of infection.  Positive  results are indicative of active infection with SARS-CoV-2.  Clinical  correlation with patient history and other diagnostic information is  necessary to determine patient infection status.  Positive results do  not rule out bacterial infection or co-infection with other viruses. If result is PRESUMPTIVE POSTIVE SARS-CoV-2 nucleic acids MAY BE PRESENT.   A presumptive positive result was obtained on the submitted specimen  and confirmed on repeat testing.  While 2019 novel coronavirus  (SARS-CoV-2) nucleic acids may be present in the submitted sample  additional confirmatory testing may  be necessary for epidemiological  and / or clinical management purposes  to differentiate between  SARS-CoV-2 and other Sarbecovirus currently known to infect humans.  If clinically indicated additional testing with an alternate test  methodology [phone])  is advised. The SARS-CoV-2 RNA is generally  detectable in upper and lower respiratory specimens during the acute  phase of infection. The expected result is Negative. Fact Sheet for Patients:  BoilerBrush.com.cy Fact Sheet for Healthcare Providers: https://pope.com/ This test is not yet approved or cleared by the [place] FDA and has been authorized for detection and/or diagnosis of SARS-CoV-2 by FDA under an Emergency Use Authorization (EUA).  This EUA  will remain in effect (meaning this test can be used) for the duration of the COVID-19 declaration under Section 564(b)(1) of the Act, 21 U.S.C. section 360bbb-3(b)(1), unless the authorization is terminated or revoked sooner. Performed at [facility], [address] [place], [place] [address]   Blood Culture (routine x 2)     Status: None (Preliminary result)   Collection Time: 02/25/19  7:08 PM   Specimen: BLOOD LEFT HAND  Result Value Ref Range Status   Specimen Description   Final    BLOOD LEFT HAND Performed at [facility], [address] [place], [place] [address]    Special Requests   Final    BOTTLES DRAWN AEROBIC AND ANAEROBIC Blood Culture results may not be optimal due to an inadequate volume of blood received in culture bottles Performed at [facility], [address], [place], [place] [address]    Culture   Final    NO GROWTH 4 DAYS Performed at [facility], [address], [place], [place] [address]    Report Status PENDING  Incomplete  Blood Culture (routine x 2)     Status: None (Preliminary result)   Collection Time: 02/25/19  7:13 PM   Specimen: BLOOD LEFT FOREARM  Result Value Ref Range Status   Specimen Description   Final    BLOOD LEFT FOREARM Performed at [facility], [address], [place], [place] [address]    Special Requests   Final    BOTTLES DRAWN AEROBIC AND ANAEROBIC Blood Culture results may not be optimal due to an inadequate volume of blood received in culture bottles Performed at [facility], [address], [place], [place] [address]    Culture   Final    NO GROWTH 4 DAYS Performed at [facility], [address], [place], [place] [address]    Report Status PENDING  Incomplete  MRSA PCR Screening     Status: None   Collection Time: 02/26/19  6:50 AM   Specimen: Nasal Mucosa; Nasopharyngeal  Result Value Ref Range Status    MRSA by PCR NEGATIVE NEGATIVE Final    Comment:        The GeneXpert MRSA Assay (FDA approved for NASAL specimens only), is one component of a comprehensive MRSA colonization surveillance program. It is not intended to diagnose MRSA infection nor to guide or monitor treatment for MRSA infections. Performed at [facility], [address], [place], [place] [address]       Radiology Studies: Ct Angio Chest Pe W Or Wo Contrast  Result Date: 03/01/2019 CLINICAL DATA:  Elevated D-dimer level. EXAM: CT ANGIOGRAPHY CHEST WITH CONTRAST TECHNIQUE: Multidetector CT imaging of the chest was performed using the standard protocol during bolus administration of intravenous contrast. Multiplanar CT image  reconstructions and MIPs were obtained to evaluate the vascular anatomy. CONTRAST:  OMNIPAQUE IOHEXOL 350 MG/ML SOLN COMPARISON:  February 25, 2019. FINDINGS: Cardiovascular: Satisfactory opacification of the pulmonary arteries to the segmental level. No evidence of pulmonary embolism. Normal heart size. No pericardial effusion. Atherosclerosis of thoracic aorta is noted without aneurysm or dissection. Mediastinum/Nodes: No enlarged mediastinal, hilar, or axillary lymph nodes. Thyroid gland, trachea, and esophagus demonstrate no significant findings. Lungs/Pleura: No pneumothorax or pleural effusion is noted. Multiple airspace opacities are noted diffusely throughout both lungs most consistent with multifocal pneumonia. Upper Abdomen: No acute abnormality. Musculoskeletal: No chest wall abnormality. No acute or significant osseous findings. Review of the MIP images confirms the above findings. IMPRESSION: No definite evidence of pulmonary embolus. Multiple airspace opacities are noted bilaterally consistent with multifocal pneumonia. Aortic Atherosclerosis (ICD10-I70.0). Electronically Signed   By: Lupita Raider M.D.   On: 03/01/2019 14:56   Vas [place] Lower Extremity Venous  (dvt)  Result Date: 03/01/2019  Lower Venous Study Indications: Edema.  Risk Factors: None identified. Limitations: Body habitus and poor ultrasound/tissue interface. Comparison Study: No prior studies. Performing Technologist: Chanda Busing RVT  Examination Guidelines: A complete evaluation includes B-mode imaging, spectral Doppler, color Doppler, and power Doppler as needed of all accessible portions of each vessel. Bilateral testing is considered an integral part of a complete examination. Limited examinations for reoccurring indications may be performed as noted.  +---------+---------------+---------+-----------+----------+--------------+  RIGHT     Compressibility Phasicity Spontaneity Properties Thrombus Aging  +---------+---------------+---------+-----------+----------+--------------+  CFV       Full            Yes       Yes                                    +---------+---------------+---------+-----------+----------+--------------+  SFJ       Full                                                             +---------+---------------+---------+-----------+----------+--------------+  FV Prox   Full                                                             +---------+---------------+---------+-----------+----------+--------------+  FV Mid    Full                                                             +---------+---------------+---------+-----------+----------+--------------+  FV Distal Full                                                             +---------+---------------+---------+-----------+----------+--------------+  PFV  Full                                                             +---------+---------------+---------+-----------+----------+--------------+  POP       Full            Yes       Yes                                    +---------+---------------+---------+-----------+----------+--------------+  PTV       Full                                                              +---------+---------------+---------+-----------+----------+--------------+  PERO      Full                                                             +---------+---------------+---------+-----------+----------+--------------+   +---------+---------------+---------+-----------+----------+--------------+  LEFT      Compressibility Phasicity Spontaneity Properties Thrombus Aging  +---------+---------------+---------+-----------+----------+--------------+  CFV       Full            Yes       Yes                                    +---------+---------------+---------+-----------+----------+--------------+  SFJ       Full                                                             +---------+---------------+---------+-----------+----------+--------------+  FV Prox   Full                                                             +---------+---------------+---------+-----------+----------+--------------+  FV Mid    Full                                                             +---------+---------------+---------+-----------+----------+--------------+  FV Distal Full                                                             +---------+---------------+---------+-----------+----------+--------------+  PFV       Full                                                             +---------+---------------+---------+-----------+----------+--------------+  POP       Full            Yes       Yes                                    +---------+---------------+---------+-----------+----------+--------------+  PTV       Full                                                             +---------+---------------+---------+-----------+----------+--------------+  PERO      Full                                                             +---------+---------------+---------+-----------+----------+--------------+     Summary: Right: There is no evidence of deep vein thrombosis in the lower extremity. No cystic structure found in the  popliteal fossa. Left: There is no evidence of deep vein thrombosis in the lower extremity. However, portions of this examination were limited- see technologist comments above. No cystic structure found in the popliteal fossa.  *See table(s) above for measurements and observations. Electronically signed by Lemar Livings MD on 03/01/2019 at 3:44:43 PM.    Final     Scheduled Meds:  aspirin EC  81 mg Oral Daily   dexamethasone (DECADRON) injection  6 mg Intravenous Daily   enoxaparin (LOVENOX) injection  0.5 mg/kg Subcutaneous Q12H   pantoprazole  40 mg Oral Daily   rosuvastatin  5 mg Oral Daily   Continuous Infusions:    LOS: 4 days   Time spent: 35 minutes.  Tyrone Nine, MD [facility] [email or web address] Password [email or web address] 03/02/2019, 3:18 PM

## 2019-03-02 NOTE — Progress Notes (Signed)
Physical Therapy Treatment Patient Details Name: ABBEE Patton MRN: [id number] DOB: 1945/08/30 Today's Date: 03/02/2019    History of Present Illness 73 y/o female w/ hx of HTN, GERD, dyslipidemia, presented w/ confusion and hypoxemia    PT Comments     Pt did well with tx this day was able to tolerate more activity than previous with decreased amount of assistance. Balance and coordination have shown improvement as well. Pt was on 4L/min via HFNC and desat to a minimum of 89%, when standing at sink to brush teeth. With cues was able to complete pursed lip breathing and sats improved to 90s with a minute.    Follow Up Recommendations  Home health PT     Equipment Recommendations  None recommended by PT    Recommendations for Other Services       Precautions / Restrictions Precautions Precautions: Fall Restrictions Weight Bearing Restrictions: No    Mobility  Bed Mobility Overal bed mobility: Needs Assistance             General bed mobility comments: Pt received sitting up in recliner this am  Transfers Overall transfer level: Needs assistance Equipment used: None Transfers: Sit to/from Stand Sit to Stand: Supervision         General transfer comment: able to stand from recliner with no AD and take few steps to sink to brush teeth  Ambulation/Gait Ambulation/Gait assistance: Min guard Gait Distance (Feet): 5 Feet Assistive device: None           Stairs             Wheelchair Mobility    Modified Rankin (Stroke Patients Only)       Balance Overall balance assessment: Needs assistance Sitting-balance support: Feet supported Sitting balance-Leahy Scale: Good     Standing balance support: No upper extremity supported Standing balance-Leahy Scale: Fair Standing balance comment: stands at sink to brush teeth unsupported                            Cognition Arousal/Alertness: Awake/alert Behavior During Therapy: WFL for  tasks assessed/performed Overall Cognitive Status: Within Functional Limits for tasks assessed                                        Exercises General Exercises - Upper Extremity Shoulder Flexion: 10 reps;Theraband Shoulder ADduction: Theraband;10 reps Elbow Flexion: 10 reps;Theraband Theraband Level (Elbow Flexion): Level 1 (Yellow) Elbow Extension: 10 reps;Theraband Theraband Level (Elbow Extension): Level 1 (Yellow) General Exercises - Lower Extremity Ankle Circles/Pumps: 10 reps Long Arc Quad: 10 reps Hip Flexion/Marching: 10 reps Other Exercises Other Exercises: IS and flutter valve x 5 each    General Comments General comments (skin integrity, edema, etc.): Pt does well with mobility this am, is able to complete sit<>stand and take few steps with SBA and no AD, also able to stand at sink approx 1 min unsupported w/ UE activity. Worked on seated ther ex with yellow theraband also reinforced IS and flutter valve exercises cues needed for correct usage.      Pertinent Vitals/Pain Pain Assessment: No/denies pain    Home Living                      Prior Function            PT Goals (current goals  can now be found in the care plan section) Acute Rehab PT Goals PT Goal Formulation: With patient Time For Goal Achievement: 03/14/19 Potential to Achieve Goals: Good Progress towards PT goals: Progressing toward goals    Frequency    Min 3X/week      PT Plan Current plan remains appropriate    Co-evaluation              AM-PAC PT "6 Clicks" Mobility   Outcome Measure  Help needed turning from your back to your side while in a flat bed without using bedrails?: A Little Help needed moving from lying on your back to sitting on the side of a flat bed without using bedrails?: A Little Help needed moving to and from a bed to a chair (including a wheelchair)?: A Little Help needed standing up from a chair using your arms (e.g., wheelchair  or bedside chair)?: A Little Help needed to walk in hospital room?: A Little Help needed climbing 3-5 steps with a railing? : A Lot 6 Click Score: 17    End of Session Equipment Utilized During Treatment: Oxygen Activity Tolerance: Treatment limited secondary to medical complications (Comment);Patient limited by fatigue Patient left: in chair;with call bell/phone within reach   PT Visit Diagnosis: Other abnormalities of gait and mobility (R26.89);Muscle weakness (generalized) (M62.81)     Time: 3536-1443 PT Time Calculation (min) (ACUTE ONLY): 33 min  Charges:  $Therapeutic Exercise: 8-22 mins $Therapeutic Activity: 8-22 mins                     Drema Pry, PT    Freddi Starr 03/02/2019, 1:38 PM

## 2019-03-03 LAB — CBC WITH DIFFERENTIAL/PLATELET
Abs Immature Granulocytes: 0.29 10*3/uL — ABNORMAL HIGH (ref 0.00–0.07)
Basophils Absolute: 0 10*3/uL (ref 0.0–0.1)
Basophils Relative: 0 %
Eosinophils Absolute: 0.1 10*3/uL (ref 0.0–0.5)
Eosinophils Relative: 1 %
HCT: 36.3 % (ref 36.0–46.0)
Hemoglobin: 11.1 g/dL — ABNORMAL LOW (ref 12.0–15.0)
Immature Granulocytes: 2 %
Lymphocytes Relative: 25 %
Lymphs Abs: 3.4 10*3/uL (ref 0.7–4.0)
MCH: 26.1 pg (ref 26.0–34.0)
MCHC: 30.6 g/dL (ref 30.0–36.0)
MCV: 85.2 fL (ref 80.0–100.0)
Monocytes Absolute: 0.7 10*3/uL (ref 0.1–1.0)
Monocytes Relative: 5 %
Neutro Abs: 9.2 10*3/uL — ABNORMAL HIGH (ref 1.7–7.7)
Neutrophils Relative %: 67 %
Platelets: 440 10*3/uL — ABNORMAL HIGH (ref 150–400)
RBC: 4.26 MIL/uL (ref 3.87–5.11)
RDW: 15.1 % (ref 11.5–15.5)
WBC: 13.7 10*3/uL — ABNORMAL HIGH (ref 4.0–10.5)
nRBC: 0 % (ref 0.0–0.2)

## 2019-03-03 LAB — CULTURE, BLOOD (ROUTINE X 2)
Culture: NO GROWTH
Culture: NO GROWTH

## 2019-03-03 LAB — COMPREHENSIVE METABOLIC PANEL
ALT: 18 U/L (ref 0–44)
AST: 19 U/L (ref 15–41)
Albumin: 2.1 g/dL — ABNORMAL LOW (ref 3.5–5.0)
Alkaline Phosphatase: 63 U/L (ref 38–126)
Anion gap: 11 (ref 5–15)
BUN: 18 mg/dL (ref 8–23)
CO2: 25 mmol/L (ref 22–32)
Calcium: 8.2 mg/dL — ABNORMAL LOW (ref 8.9–10.3)
Chloride: 106 mmol/L (ref 98–111)
Creatinine, Ser: 0.74 mg/dL (ref 0.44–1.00)
GFR calc Af Amer: >60 mL/min (ref >60)
GFR calc non Af Amer: >60 mL/min (ref >60)
Glucose, Bld: 138 mg/dL — ABNORMAL HIGH (ref 70–99)
Potassium: 4.1 mmol/L (ref 3.5–5.1)
Sodium: 142 mmol/L (ref 135–145)
Total Bilirubin: 0.9 mg/dL (ref 0.3–1.2)
Total Protein: 5.7 g/dL — ABNORMAL LOW (ref 6.5–8.1)

## 2019-03-03 LAB — C-REACTIVE PROTEIN: CRP: 6.3 mg/dL — ABNORMAL HIGH (ref <1.0)

## 2019-03-03 LAB — D-DIMER, QUANTITATIVE: D-Dimer, Quant: 2.83 ug/mL-FEU — ABNORMAL HIGH (ref 0.00–0.50)

## 2019-03-03 LAB — FERRITIN: Ferritin: 137 ng/mL (ref 11–307)

## 2019-03-03 NOTE — Progress Notes (Signed)
PROGRESS NOTE  Geannie RisenGail L Parslow  WUJ:[id number] DOB: [birth year] DOA: 02/25/2019 PCP: Corine ShelterKilpatrick, George, MD   Brief Narrative: Geannie RisenGail L Teston is a 73 y.o. female with a history of HTN, dyslipidemia, and GERD who presented to the ED 10/11 by EMS when she appeared to be confused speaking on the phone. EMS reported hypoxia to 73%, placed on 15L NRB and was found to have positive SARS-CoV-2 testing with bilateral interstitial infiltrates. Oxygen was weaned to 4L O2 with remdesivir and steroids provided.  Assessment & Plan: Principal Problem:   Respiratory failure (HCC)/ hypoxic Active Problems:   Pneumonia due to COVID-19 virus   Essential hypertension   Dyslipidemia   GERD (gastroesophageal reflux disease)  Acute hypoxic respiratory failure due to covid-19 pneumonia: Persistently hypoxic though improving generally.  - Wean oxygen as tolerated, currently stable on 3L O2. - CRP declining overall, but remains elevated, repeat pending today, though ferritin has improved significantly. Note LE U/S and CTA chest ruled out thrombosis/PE. CTA chest also demonstrated typical GGO's without lobar consolidation, pleural effusions, or pulmonary edema.  - Continue airborne, contact precautions. PPE including surgical gown, gloves, cap, shoe covers, and CAPR used during this encounter in a negative pressure room.  - Check inflammatory markers regularly as above.   - Recommend proning and aggressive use of incentive spirometry.  AKI: Resolved. - Avoid nephrotoxins  Hypokalemia: Resolved. - Monitor and supplement as indicated  HTN:  - Hold antihypertensive with normotension.  LFT elevation: Resolved. No longer checking as remdesivir Tx is completed.  Acute metabolic encephalopathy due to covid-19 infection: Resolved.  Obesity: BMI 39.  - Noted  DVT prophylaxis: Lovenox 0.5mg /kg q12h Code Status: Full Family Communication: Son by phone daily Disposition Plan: Uncertain. Son is concerned about  patient's fall risk and concerned she may need SNF rehabilitation. PT has reported improvement in serial evaluations, recommending home health at discharge. OT recommended SNF. Will need continued evaluations as she clinically progresses, anticipate making determination once she's weaned to 2L O2.  Consultants:   None  Procedures:   None  Antimicrobials:  Remdesivir (10/12 - 10/16)  Subjective: Shortness of breath now only present on exertion and during coughing spells. Otherwise, improved. No chest or any other pain. Feels she's getting better. Eating ok. Appeared stronger, more balanced and less short of breath with exertion with PT yesterday.   Objective: Vitals:   03/02/19 1543 03/02/19 1933 03/03/19 0513 03/03/19 0839  BP: (!) 154/127 120/68 134/74 136/69  Pulse: 89  89 83  Resp: (!) 27 (!) 23 19 (!) 21  Temp: 98.7 F (37.1 C) 98.8 F (37.1 C) 98.1 F (36.7 C) 98.7 F (37.1 C)  TempSrc: Oral Oral Oral Oral  SpO2: 92% 97% 97% 93%  Weight:      Height:        Intake/Output Summary (Last 24 hours) at 03/03/2019 [id number] Last data filed at 03/03/2019 0630 Gross per 24 hour  Intake 0 ml  Output 1000 ml  Net -1000 ml   Filed Weights   02/25/19 1852 02/26/19 0500  Weight: 102.1 kg 106.2 kg   Gen: 73 y.o. female in no distress Pulm: Nonlabored breathing supplemental oxygen, crackles at L > R bases. CV: Regular rate and rhythm. No murmur, rub, or gallop. No JVD, no significant dependent edema. GI: Abdomen soft, non-tender, non-distended, with normoactive bowel sounds.  Ext: Warm, no deformities Skin: No rashes, lesions or ulcers on visualized skin. Neuro: Alert and oriented. No focal neurological deficits. Psych: Judgement and insight  appear fair. Mood euthymic & affect congruent. Behavior is appropriate.    Data Reviewed: I have personally reviewed following labs and imaging studies  CBC: Recent Labs  Lab 02/27/19 0330 02/28/19 0234 03/01/19 0255 03/02/19 0227  03/03/19 0600  WBC 11.0* 15.0* 13.0* 12.6* 13.7*  NEUTROABS 8.9* 12.1* 9.1* 8.8* 9.2*  HGB 11.3* 11.2* 11.2* 11.1* 11.1*  HCT 37.1 36.1 36.0 37.0 36.3  MCV 86.7 84.7 85.5 85.8 85.2  PLT 429* 414* 418* 449* 440*   Basic Metabolic Panel: Recent Labs  Lab 02/27/19 0330 02/28/19 0234 03/01/19 0255 03/02/19 0227 03/03/19 0600  NA 138 140 139 140 142  K 3.4* 3.6 3.5 3.5 4.1  CL 96* 101 102 103 106  CO2 GLUCOSE 190* 99 103* 165* 138*  BUN CREATININE 0.95 0.73 0.75 0.73 0.74  CALCIUM 8.6* 8.2* 8.2* 8.2* 8.2*   Liver Function Tests: Recent Labs  Lab 02/27/19 0330 02/28/19 0234 03/01/19 0255 03/02/19 0227 03/03/19 0600  AST ALT ALKPHOS 52 57 58 71 63  BILITOT 0.8 0.7 0.5 0.2* 0.9  PROT 6.3* 5.9* 5.8* 5.9* 5.7*  ALBUMIN 2.2* 2.1* 2.1* 2.1* 2.1*   Anemia Panel: Recent Labs    03/01/19 0255 03/02/19 0227  FERRITIN 191 153   Urine analysis:    Component Value Date/Time   BILIRUBINUR negative 06/03/2015 1024   PROTEINUR trace 06/03/2015 1024   UROBILINOGEN negative 06/03/2015 1024   NITRITE negative 06/03/2015 1024   LEUKOCYTESUR Trace (A) 06/03/2015 1024   Recent Results (from the past 240 hour(s))  SARS Coronavirus 2 by RT PCR (hospital order, performed in [facility] hospital lab) Nasopharyngeal Nasopharyngeal Swab     Status: Abnormal   Collection Time: 02/25/19  7:08 PM   Specimen: Nasopharyngeal Swab  Result Value Ref Range Status   SARS Coronavirus 2 POSITIVE (A) NEGATIVE Final    Comment: RESULT CALLED TO, READ BACK BY AND VERIFIED WITH: FRICKEY,J AT 2336 ON 02/25/2019 BY JPM (NOTE) If result is NEGATIVE SARS-CoV-2 target nucleic acids are NOT DETECTED. The SARS-CoV-2 RNA is generally detectable in upper and lower  respiratory specimens during the acute phase of infection. The lowest  concentration of SARS-CoV-2 viral copies this assay can detect is 250  copies / mL. A negative result does  not preclude SARS-CoV-2 infection  and should not be used as the sole basis for treatment or other  patient management decisions.  A negative result may occur with  improper specimen collection / handling, submission of specimen other  than nasopharyngeal swab, presence of viral mutation(s) within the  areas targeted by this assay, and inadequate number of viral copies  (<250 copies / mL). A negative result must be combined with clinical  observations, patient history, and epidemiological information. If result is POSITIVE SARS-CoV-2 target nucleic acids are DETECTED.  The SARS-CoV-2 RNA is generally detectable in upper and lower  respiratory specimens during the acute phase of infection.  Positive  results are indicative of active infection with SARS-CoV-2.  Clinical  correlation with patient history and other diagnostic information is  necessary to determine patient infection status.  Positive results do  not rule out bacterial infection or co-infection with other viruses. If result is PRESUMPTIVE POSTIVE SARS-CoV-2 nucleic acids MAY BE PRESENT.   A presumptive positive result was obtained on the submitted specimen  and confirmed on repeat testing.  While 2019  novel coronavirus  (SARS-CoV-2) nucleic acids may be present in the submitted sample  additional confirmatory testing may be necessary for epidemiological  and / or clinical management purposes  to differentiate between  SARS-CoV-2 and other Sarbecovirus currently known to infect humans.  If clinically indicated additional testing with an alternate test  methodology [phone])  is advised. The SARS-CoV-2 RNA is generally  detectable in upper and lower respiratory specimens during the acute  phase of infection. The expected result is Negative. Fact Sheet for Patients:  BoilerBrush.com.cy Fact Sheet for Healthcare Providers: https://pope.com/ This test is not yet approved or  cleared by the [place] FDA and has been authorized for detection and/or diagnosis of SARS-CoV-2 by FDA under an Emergency Use Authorization (EUA).  This EUA will remain in effect (meaning this test can be used) for the duration of the COVID-19 declaration under Section 564(b)(1) of the Act, 21 U.S.C. section 360bbb-3(b)(1), unless the authorization is terminated or revoked sooner. Performed at [facility], [address] [place], [place] [address]   Blood Culture (routine x 2)     Status: None   Collection Time: 02/25/19  7:08 PM   Specimen: BLOOD LEFT HAND  Result Value Ref Range Status   Specimen Description   Final    BLOOD LEFT HAND Performed at [facility], [address], [place], [place] [address]    Special Requests   Final    BOTTLES DRAWN AEROBIC AND ANAEROBIC Blood Culture results may not be optimal due to an inadequate volume of blood received in culture bottles Performed at [facility], [address] [place], [place] [address]    Culture   Final    NO GROWTH 5 DAYS Performed at [facility], [address], [place], [place] [address]    Report Status 03/03/2019 FINAL  Final  Blood Culture (routine x 2)     Status: None   Collection Time: 02/25/19  7:13 PM   Specimen: BLOOD LEFT FOREARM  Result Value Ref Range Status   Specimen Description   Final    BLOOD LEFT FOREARM Performed at [facility], [address] [place], [place] [address]    Special Requests   Final    BOTTLES DRAWN AEROBIC AND ANAEROBIC Blood Culture results may not be optimal due to an inadequate volume of blood received in culture bottles Performed at [facility], [address] [place], [place] [address]    Culture   Final    NO GROWTH 5 DAYS Performed at [facility], [address] [place], [place] [address]    Report Status 03/03/2019 FINAL  Final  MRSA PCR Screening      Status: None   Collection Time: 02/26/19  6:50 AM   Specimen: Nasal Mucosa; Nasopharyngeal  Result Value Ref Range Status   MRSA by PCR NEGATIVE NEGATIVE Final    Comment:        The GeneXpert MRSA Assay (FDA approved for NASAL specimens only), is one component of a comprehensive MRSA colonization surveillance program. It is not intended to diagnose MRSA infection nor to guide or monitor treatment for MRSA infections. Performed at [facility], [address] [place], [place] [address]       Radiology Studies: Ct Angio Chest Pe W Or Wo Contrast  Result Date: 03/01/2019 CLINICAL DATA:  Elevated D-dimer level. EXAM: CT ANGIOGRAPHY CHEST WITH CONTRAST TECHNIQUE: Multidetector CT imaging of the  chest was performed using the standard protocol during bolus administration of intravenous contrast. Multiplanar CT image reconstructions and MIPs were obtained to evaluate the vascular anatomy. CONTRAST:  131mL OMNIPAQUE IOHEXOL 350 MG/ML SOLN COMPARISON:  February 25, 2019. FINDINGS: Cardiovascular: Satisfactory opacification of the pulmonary arteries to the segmental level. No evidence of pulmonary embolism. Normal heart size. No pericardial effusion. Atherosclerosis of thoracic aorta is noted without aneurysm or dissection. Mediastinum/Nodes: No enlarged mediastinal, hilar, or axillary lymph nodes. Thyroid gland, trachea, and esophagus demonstrate no significant findings. Lungs/Pleura: No pneumothorax or pleural effusion is noted. Multiple airspace opacities are noted diffusely throughout both lungs most consistent with multifocal pneumonia. Upper Abdomen: No acute abnormality. Musculoskeletal: No chest wall abnormality. No acute or significant osseous findings. Review of the MIP images confirms the above findings. IMPRESSION: No definite evidence of pulmonary embolus. Multiple airspace opacities are noted bilaterally consistent with multifocal pneumonia. Aortic Atherosclerosis  (ICD10-I70.0). Electronically Signed   By: Marijo Conception M.D.   On: 03/01/2019 14:56   Vas [place] Lower Extremity Venous (dvt)  Result Date: 03/01/2019  Lower Venous Study Indications: Edema.  Risk Factors: None identified. Limitations: Body habitus and poor ultrasound/tissue interface. Comparison Study: No prior studies. Performing Technologist: Oliver Hum RVT  Examination Guidelines: A complete evaluation includes B-mode imaging, spectral Doppler, color Doppler, and power Doppler as needed of all accessible portions of each vessel. Bilateral testing is considered an integral part of a complete examination. Limited examinations for reoccurring indications may be performed as noted.  +---------+---------------+---------+-----------+----------+--------------+  RIGHT     Compressibility Phasicity Spontaneity Properties Thrombus Aging  +---------+---------------+---------+-----------+----------+--------------+  CFV       Full            Yes       Yes                                    +---------+---------------+---------+-----------+----------+--------------+  SFJ       Full                                                             +---------+---------------+---------+-----------+----------+--------------+  FV Prox   Full                                                             +---------+---------------+---------+-----------+----------+--------------+  FV Mid    Full                                                             +---------+---------------+---------+-----------+----------+--------------+  FV Distal Full                                                             +---------+---------------+---------+-----------+----------+--------------+  PFV       Full                                                             +---------+---------------+---------+-----------+----------+--------------+  POP       Full            Yes       Yes                                     +---------+---------------+---------+-----------+----------+--------------+  PTV       Full                                                             +---------+---------------+---------+-----------+----------+--------------+  PERO      Full                                                             +---------+---------------+---------+-----------+----------+--------------+   +---------+---------------+---------+-----------+----------+--------------+  LEFT      Compressibility Phasicity Spontaneity Properties Thrombus Aging  +---------+---------------+---------+-----------+----------+--------------+  CFV       Full            Yes       Yes                                    +---------+---------------+---------+-----------+----------+--------------+  SFJ       Full                                                             +---------+---------------+---------+-----------+----------+--------------+  FV Prox   Full                                                             +---------+---------------+---------+-----------+----------+--------------+  FV Mid    Full                                                             +---------+---------------+---------+-----------+----------+--------------+  FV Distal Full                                                             +---------+---------------+---------+-----------+----------+--------------+  PFV       Full                                                             +---------+---------------+---------+-----------+----------+--------------+  POP       Full            Yes       Yes                                    +---------+---------------+---------+-----------+----------+--------------+  PTV       Full                                                             +---------+---------------+---------+-----------+----------+--------------+  PERO      Full                                                              +---------+---------------+---------+-----------+----------+--------------+     Summary: Right: There is no evidence of deep vein thrombosis in the lower extremity. No cystic structure found in the popliteal fossa. Left: There is no evidence of deep vein thrombosis in the lower extremity. However, portions of this examination were limited- see technologist comments above. No cystic structure found in the popliteal fossa.  *See table(s) above for measurements and observations. Electronically signed by Lemar Livings MD on 03/01/2019 at 3:44:43 PM.    Final     Scheduled Meds:  aspirin EC  81 mg Oral Daily   dexamethasone (DECADRON) injection  6 mg Intravenous Daily   enoxaparin (LOVENOX) injection  0.5 mg/kg Subcutaneous Q12H   pantoprazole  40 mg Oral Daily   rosuvastatin  5 mg Oral Daily   Continuous Infusions:    LOS: 5 days   Time spent: 25 minutes.  Tyrone Nine, MD [facility] [email or web address] Password [email or web address] 03/03/2019, 9:21 AM

## 2019-03-04 LAB — SARS CORONAVIRUS 2 (TAT 6-24 HRS): SARS Coronavirus 2: NEGATIVE

## 2019-03-04 NOTE — Plan of Care (Signed)
No acute changes this shift. Pt exercise tolerance improving. Weaned to RA. Re-swab for covid. PT/OT to reevaluate patient tomorrow for DC planning. VS stable. Fall precautions maintained. Call light in reach.   Problem: Education: Goal: Knowledge of risk factors and measures for prevention of condition will improve Outcome: Progressing   Problem: Coping: Goal: Psychosocial and spiritual needs will be supported Outcome: Progressing   Problem: Respiratory: Goal: Will maintain a patent airway Outcome: Progressing Goal: Complications related to the disease process, condition or treatment will be avoided or minimized Outcome: Progressing   Problem: Education: Goal: Knowledge of General Education information will improve Description: Including pain rating scale, medication(s)/side effects and non-pharmacologic comfort measures Outcome: Progressing   Problem: Health Behavior/Discharge Planning: Goal: Ability to manage health-related needs will improve Outcome: Progressing   Problem: Clinical Measurements: Goal: Ability to maintain clinical measurements within normal limits will improve Outcome: Progressing Goal: Will remain free from infection Outcome: Progressing Goal: Diagnostic test results will improve Outcome: Progressing Goal: Respiratory complications will improve Outcome: Progressing Goal: Cardiovascular complication will be avoided Outcome: Progressing   Problem: Activity: Goal: Risk for activity intolerance will decrease Outcome: Progressing   Problem: Nutrition: Goal: Adequate nutrition will be maintained Outcome: Progressing   Problem: Coping: Goal: Level of anxiety will decrease Outcome: Progressing   Problem: Elimination: Goal: Will not experience complications related to bowel motility Outcome: Progressing Goal: Will not experience complications related to urinary retention Outcome: Progressing   Problem: Pain Managment: Goal: General experience of  comfort will improve Outcome: Progressing   Problem: Safety: Goal: Ability to remain free from injury will improve Outcome: Progressing   Problem: Skin Integrity: Goal: Risk for impaired skin integrity will decrease Outcome: Progressing

## 2019-03-04 NOTE — Progress Notes (Signed)
Called and spoke with patient's son, Vonnetta Akey.  Updated on patient's condition.  All questions and concerns addressed.  Earleen Reaper RN

## 2019-03-04 NOTE — Progress Notes (Signed)
[name] L Florea  JYN:[id number] DOB: [birth year] DOA: 02/25/2019 PCP: Corine Shelter, MD   Brief Narrative: [name] is a 73 y.o. female with a history of HTN, dyslipidemia, and GERD who presented to the ED 10/11 by EMS when she appeared to be confused speaking on the phone. EMS reported hypoxia to 73%, placed on 15L NRB and was found to have positive SARS-CoV-2 testing with bilateral interstitial infiltrates. Oxygen was weaned to 4L O2 with remdesivir and steroids provided.  Assessment & Plan: Principal Problem:   Respiratory failure (HCC)/ hypoxic Active Problems:   Pneumonia due to COVID-19 virus   Essential hypertension   Dyslipidemia   GERD (gastroesophageal reflux disease)  Acute hypoxic respiratory failure due to covid-19 pneumonia: Improving, suspect only exertionally hypoxic.  - Wean oxygen as tolerated  - CRP declining overall, but remains elevated. Recheck in AM. Note LE U/S and CTA chest ruled out thrombosis/PE. CTA chest also demonstrated typical GGO's without lobar consolidation, pleural effusions, or pulmonary edema.  - Continue airborne, contact precautions. PPE including surgical gown, gloves, cap, shoe covers, and CAPR used during this encounter in a negative pressure room.  - Check inflammatory markers  - Recommend proning and aggressive use of incentive spirometry.  AKI: Resolved. - Avoid nephrotoxins  Hypokalemia: Resolved. - Monitor and supplement as indicated  HTN:  - Holding antihypertensive with normotension.  LFT elevation: Resolved. No longer checking as remdesivir Tx is completed.  Acute metabolic encephalopathy due to covid-19 infection: Resolved.  Obesity: BMI 39.  - Noted  DVT prophylaxis: Lovenox 0.5mg /kg q12h Code Status: Full Family Communication: Son by phone daily Disposition Plan: Uncertain. May be ready for discharge 10/19. Will ask for PT and OT reevaluations tomorrow to determine best disposition. Will recheck  covid in case of need for SNF.    Consultants:   None  Procedures:   None  Antimicrobials:  Remdesivir (10/12 - 10/16)  Subjective: Shortness of breath resolved at rest, weaned from oxygen but still breathing fast. Does have moderate shortness of breath with exertion/getting up to chair. Wants to go home but would go to facility from discharge if recommended.   Objective: Vitals:   03/04/19 0600 03/04/19 0615 03/04/19 0630 03/04/19 0755  BP:    122/81  Pulse: 74 76 74 82  Resp: (!) 27 (!) 25 20 (!) 26  Temp:    98.9 F (37.2 C)  TempSrc:    Oral  SpO2: 95% 93% 94% 94%  Weight:      Height:        Intake/Output Summary (Last 24 hours) at 03/04/2019 0951 Last data filed at 03/04/2019 0600 Gross per 24 hour  Intake 720 ml  Output 2150 ml  Net -1430 ml   Filed Weights   02/25/19 1852 02/26/19 0500  Weight: 102.1 kg 106.2 kg   Gen: Very pleasant female in no distress Pulm: Nonlabored but tachypneic at rest on room air, SpO2 >92%. Crackles essentially unchanged. CV: Regular rate and rhythm. No murmur, rub, or gallop. No JVD, no significant dependent edema. GI: Abdomen soft, non-tender, non-distended, with normoactive bowel sounds.  Ext: Warm, no deformities Skin: No new rashes, lesions or ulcers on visualized skin. Neuro: Alert and oriented. No focal neurological deficits. Psych: Judgement and insight appear fair. Mood euthymic & affect congruent. Behavior is appropriate.    Data Reviewed: I have personally reviewed following labs and imaging studies  CBC: Recent Labs  Lab 02/27/19 0330 02/28/19 0234 03/01/19 0255 03/02/19 0227 03/03/19  0600  WBC 11.0* 15.0* 13.0* 12.6* 13.7*  NEUTROABS 8.9* 12.1* 9.1* 8.8* 9.2*  HGB 11.3* 11.2* 11.2* 11.1* 11.1*  HCT 37.1 36.1 36.0 37.0 36.3  MCV 86.7 84.7 85.5 85.8 85.2  PLT 429* 414* 418* 449* 440*   Basic Metabolic Panel: Recent Labs  Lab 02/27/19 0330 02/28/19 0234 03/01/19 0255 03/02/19 0227 03/03/19 0600   NA 138 140 139 140 142  K 3.4* 3.6 3.5 3.5 4.1  CL 96* 101 102 103 106  CO2 28 29 26 26 25   GLUCOSE 190* 99 103* 165* 138*  BUN 17 18 17 17 18   CREATININE 0.95 0.73 0.75 0.73 0.74  CALCIUM 8.6* 8.2* 8.2* 8.2* 8.2*   Liver Function Tests: Recent Labs  Lab 02/27/19 0330 02/28/19 0234 03/01/19 0255 03/02/19 0227 03/03/19 0600  AST 24 18 22 17 19   ALT 19 15 19 19 18   ALKPHOS 52 57 58 71 63  BILITOT 0.8 0.7 0.5 0.2* 0.9  PROT 6.3* 5.9* 5.8* 5.9* 5.7*  ALBUMIN 2.2* 2.1* 2.1* 2.1* 2.1*   Anemia Panel: Recent Labs    03/02/19 0227 03/03/19 0600  FERRITIN 153 137   Urine analysis:    Component Value Date/Time   BILIRUBINUR negative 06/03/2015 1024   PROTEINUR trace 06/03/2015 1024   UROBILINOGEN negative 06/03/2015 1024   NITRITE negative 06/03/2015 1024   LEUKOCYTESUR Trace (A) 06/03/2015 1024   Recent Results (from the past 240 hour(s))  SARS Coronavirus 2 by RT PCR (hospital order, performed in [facility] hospital lab) Nasopharyngeal Nasopharyngeal Swab     Status: Abnormal   Collection Time: 02/25/19  7:08 PM   Specimen: Nasopharyngeal Swab  Result Value Ref Range Status   SARS Coronavirus 2 POSITIVE (A) NEGATIVE Final    Comment: RESULT CALLED TO, READ BACK BY AND VERIFIED WITH: FRICKEY,J AT 2336 ON 02/25/2019 BY JPM (NOTE) If result is NEGATIVE SARS-CoV-2 target nucleic acids are NOT DETECTED. The SARS-CoV-2 RNA is generally detectable in upper and lower  respiratory specimens during the acute phase of infection. The lowest  concentration of SARS-CoV-2 viral copies this assay can detect is 250  copies / mL. A negative result does not preclude SARS-CoV-2 infection  and should not be used as the sole basis for treatment or other  patient management decisions.  A negative result may occur with  improper specimen collection / handling, submission of specimen other  than nasopharyngeal swab, presence of viral mutation(s) within the  areas targeted by this assay,  and inadequate number of viral copies  (<250 copies / mL). A negative result must be combined with clinical  observations, patient history, and epidemiological information. If result is POSITIVE SARS-CoV-2 target nucleic acids are DETECTED.  The SARS-CoV-2 RNA is generally detectable in upper and lower  respiratory specimens during the acute phase of infection.  Positive  results are indicative of active infection with SARS-CoV-2.  Clinical  correlation with patient history and other diagnostic information is  necessary to determine patient infection status.  Positive results do  not rule out bacterial infection or co-infection with other viruses. If result is PRESUMPTIVE POSTIVE SARS-CoV-2 nucleic acids MAY BE PRESENT.   A presumptive positive result was obtained on the submitted specimen  and confirmed on repeat testing.  While 2019 novel coronavirus  (SARS-CoV-2) nucleic acids may be present in the submitted sample  additional confirmatory testing may be necessary for epidemiological  and / or clinical management purposes  to differentiate between  SARS-CoV-2 and other Sarbecovirus currently  known to infect humans.  If clinically indicated additional testing with an alternate test  methodology [phone])  is advised. The SARS-CoV-2 RNA is generally  detectable in upper and lower respiratory specimens during the acute  phase of infection. The expected result is Negative. Fact Sheet for Patients:  StrictlyIdeas.no Fact Sheet for Healthcare Providers: BankingDealers.co.za This test is not yet approved or cleared by the [place] FDA and has been authorized for detection and/or diagnosis of SARS-CoV-2 by FDA under an Emergency Use Authorization (EUA).  This EUA will remain in effect (meaning this test can be used) for the duration of the COVID-19 declaration under Section 564(b)(1) of the Act, 21 U.S.C. section 360bbb-3(b)(1), unless  the authorization is terminated or revoked sooner. Performed at [facility], [place] [address] [place], [place] [address]   Blood Culture (routine x 2)     Status: None   Collection Time: 02/25/19  7:08 PM   Specimen: BLOOD LEFT HAND  Result Value Ref Range Status   Specimen Description   Final    BLOOD LEFT HAND Performed at [place] [address], [place], [place] [address]    Special Requests   Final    BOTTLES DRAWN AEROBIC AND ANAEROBIC Blood Culture results may not be optimal due to an inadequate volume of blood received in culture bottles Performed at [facility] [address] [place], [place] [address]    Culture   Final    NO GROWTH 5 DAYS Performed at [facility], [place] [address], [place], [place] [address]    Report Status 03/03/2019 FINAL  Final  Blood Culture (routine x 2)     Status: None   Collection Time: 02/25/19  7:13 PM   Specimen: BLOOD LEFT FOREARM  Result Value Ref Range Status   Specimen Description   Final    BLOOD LEFT FOREARM Performed at [place] [address] [place], [place] [address]    Special Requests   Final    BOTTLES DRAWN AEROBIC AND ANAEROBIC Blood Culture results may not be optimal due to an inadequate volume of blood received in culture bottles Performed at [place] [address] [place], [place] [address]    Culture   Final    NO GROWTH 5 DAYS Performed at [facility], [place] [address] [place], [place] [address]    Report Status 03/03/2019 FINAL  Final  MRSA PCR Screening     Status: None   Collection Time: 02/26/19  6:50 AM   Specimen: Nasal Mucosa; Nasopharyngeal  Result Value Ref Range Status   MRSA by PCR NEGATIVE NEGATIVE Final    Comment:        The GeneXpert MRSA Assay (FDA approved for NASAL specimens only), is one component of a comprehensive MRSA colonization surveillance program. It is not  intended to diagnose MRSA infection nor to guide or monitor treatment for MRSA infections. Performed at [facility], [place] [address], [place], [place] [address]       Radiology Studies: No results found.  Scheduled Meds: . aspirin EC  81 mg Oral Daily  . dexamethasone (DECADRON) injection  6 mg Intravenous Daily  . enoxaparin (LOVENOX) injection  0.5 mg/kg Subcutaneous Q12H  . pantoprazole  40 mg Oral Daily  . rosuvastatin  5 mg Oral Daily   Continuous Infusions:    LOS: 6 days   Time spent: 25 minutes.  Meredith Leeds  Jarvis Newcomer, MD [facility] [email or web address] Password [email or web address] 03/04/2019, 9:51 AM

## 2019-03-04 NOTE — Progress Notes (Signed)
Called Son. No answer this afternoon. Will attempt to provide update later.   Vance Gather, MD 03/04/2019 3:16 PM

## 2019-03-05 LAB — BASIC METABOLIC PANEL
Anion gap: 10 (ref 5–15)
BUN: 16 mg/dL (ref 8–23)
CO2: 28 mmol/L (ref 22–32)
Calcium: 8.4 mg/dL — ABNORMAL LOW (ref 8.9–10.3)
Chloride: 102 mmol/L (ref 98–111)
Creatinine, Ser: 0.67 mg/dL (ref 0.44–1.00)
GFR calc Af Amer: >60 mL/min (ref >60)
GFR calc non Af Amer: >60 mL/min (ref >60)
Glucose, Bld: 99 mg/dL (ref 70–99)
Potassium: 4.6 mmol/L (ref 3.5–5.1)
Sodium: 140 mmol/L (ref 135–145)

## 2019-03-05 LAB — CBC
HCT: 37.3 % (ref 36.0–46.0)
Hemoglobin: 11.5 g/dL — ABNORMAL LOW (ref 12.0–15.0)
MCH: 26.4 pg (ref 26.0–34.0)
MCHC: 30.8 g/dL (ref 30.0–36.0)
MCV: 85.6 fL (ref 80.0–100.0)
Platelets: 485 10*3/uL — ABNORMAL HIGH (ref 150–400)
RBC: 4.36 MIL/uL (ref 3.87–5.11)
RDW: 15.5 % (ref 11.5–15.5)
WBC: 13.6 10*3/uL — ABNORMAL HIGH (ref 4.0–10.5)
nRBC: 0 % (ref 0.0–0.2)

## 2019-03-05 LAB — C-REACTIVE PROTEIN: CRP: 2 mg/dL — ABNORMAL HIGH (ref <1.0)

## 2019-03-05 MED ORDER — ENOXAPARIN SODIUM 60 MG/0.6ML ~~LOC~~ SOLN
0.5000 mg/kg | SUBCUTANEOUS | Status: DC
  Administered 2019-03-06 – 2019-03-07 (×2): 55 mg via SUBCUTANEOUS
  Filled 2019-03-05 (×2): qty 0.6

## 2019-03-05 MED ORDER — DEXAMETHASONE SODIUM PHOSPHATE 10 MG/ML IJ SOLN
6.0000 mg | Freq: Every day | INTRAMUSCULAR | Status: AC
  Administered 2019-03-06: 6 mg via INTRAVENOUS
  Filled 2019-03-05: qty 1

## 2019-03-05 NOTE — Progress Notes (Signed)
Ok to change lovenox to 0.5mg /kg qday per Dr. Bonner Puna.  Onnie Boer, PharmD, BCIDP, AAHIVP, CPP Infectious Disease Pharmacist 03/05/2019 9:44 AM

## 2019-03-05 NOTE — NC FL2 (Signed)
  [place] LEVEL OF CARE SCREENING TOOL     IDENTIFICATION  Patient Name: [name] Birthdate: Jun 05, 1945 Sex: female Admission Date (Current Location): 02/25/2019  [facility] and [place] Number:  Herbalist and Address:  [facility], [place] [address] [place], [place] [address]      Provider Number: [id number]  Attending Physician Name and Address:  Patrecia Pour, MD  Relative Name and Phone Number:  [place], [place] [phone]    Current Level of Care: Hospital Recommended Level of Care: [place] Prior Approval Number:    Date Approved/Denied:   PASRR Number: [id number]  Discharge Plan: SNF    Current Diagnoses: Patient Active Problem List   Diagnosis Date Noted  . Pneumonia due to COVID-19 virus 02/26/2019  . Respiratory failure (HCC)/ hypoxic 02/26/2019  . Essential hypertension 02/26/2019  . Dyslipidemia 02/26/2019  . GERD (gastroesophageal reflux disease) 02/26/2019  . Bunion 08/21/2014  . 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 Onychomycosis 02/28/2013  . Pain, foot 02/28/2013    Orientation RESPIRATION BLADDER Height & Weight     Self, Time, Situation, Place  Normal External catheter, Incontinent(02/26/19) Weight: 234 lb 2.1 oz (106.2 kg) Height:  5' 4.5" (163.8 cm)  BEHAVIORAL SYMPTOMS/MOOD NEUROLOGICAL BOWEL NUTRITION STATUS      Continent Diet(regular diet)  AMBULATORY STATUS COMMUNICATION OF NEEDS Skin   Limited Assist Verbally Normal                       Personal Care Assistance Level of Assistance  Bathing, Feeding, Dressing Bathing Assistance: Limited assistance Feeding assistance: Independent Dressing Assistance: Limited assistance     Functional Limitations Info  Sight, Hearing, Speech Sight Info: Adequate Hearing Info: Adequate Speech Info: Adequate    SPECIAL CARE FACTORS FREQUENCY  PT (By licensed PT), OT (By licensed OT)     PT Frequency: 5x OT  Frequency: 5x            Contractures Contractures Info: Not present    Additional Factors Info  Code Status, Allergies Code Status Info: Full Code Allergies Info: Shellfish-derived Products, Codeine, Tetracyclines & Related, Vibramycin (Doxycycline Calcium)           Current Medications (03/05/2019):  This is the current hospital active medication list Current Facility-Administered Medications  Medication Dose Route Frequency Provider Last Rate Last Dose  . aspirin EC tablet 81 mg  81 mg Oral Daily Tu, Ching T, DO   81 mg at 03/05/19 9390  . [START ON 03/06/2019] dexamethasone (DECADRON) injection 6 mg  6 mg Intravenous Daily Patrecia Pour, MD      . Derrill Memo ON 03/06/2019] enoxaparin (LOVENOX) injection 55 mg  0.5 mg/kg Subcutaneous Q24H Patrecia Pour, MD      . guaiFENesin-dextromethorphan (ROBITUSSIN DM) 100-10 MG/5ML syrup 10 mL  10 mL Oral Q4H PRN Arrien, Jimmy Picket, MD   10 mL at 02/26/19 2128  . pantoprazole (PROTONIX) EC tablet 40 mg  40 mg Oral Daily Tu, Ching T, DO   40 mg at 03/05/19 3009  . rosuvastatin (CRESTOR) tablet 5 mg  5 mg Oral Daily Tu, Ching T, DO   5 mg at 03/05/19 2330     Discharge Medications: Please see discharge summary for a list of discharge medications.  Relevant Imaging Results:  Relevant Lab Results:   Additional Information [id number]  Eileen Stanford, LCSW

## 2019-03-05 NOTE — Progress Notes (Addendum)
Occupational Therapy Treatment Patient Details Name: [name] MRN: [id number] DOB: [birth year] Today's Date: 03/05/2019    History of present illness 73 y/o female w/ hx of HTN, GERD, dyslipidemia, presented w/ confusion and hypoxemia   OT comments  Pt making good progress towards OT goals. Pt performing activity during session on RA with SpO2 >/= 88%, but with elevated HR (up to the high 130s) with activity requiring seated rest breaks. Pt completing room level mobility using RW, toileting, standing grooming, LB and UB ADL at supervision-minguard assist level. Pt mildly impulsive with activity requiring intermittent cues for safety as well as to initiate rest breaks. Given pt lives alone and with decreased caregiver support continue to recommend SNF level therapies at time of discharge. Discussed with pt and she is open to rehab prior to return home. Will continue to follow while acutely admitted.   Follow Up Recommendations  SNF;Supervision/Assistance - 24 hour    Equipment Recommendations  Tub/shower bench;3 in 1 bedside commode;Other (comment)(to be further assessed)          Precautions / Restrictions Precautions Precautions: Fall Restrictions Weight Bearing Restrictions: No       Mobility Bed Mobility Overal bed mobility: Needs Assistance Bed Mobility: Supine to Sit     Supine to sit: Supervision     General bed mobility comments: for lines/safety  Transfers Overall transfer level: Needs assistance Equipment used: None;Rolling walker (2 wheeled) Transfers: Sit to/from [employer] Sit to Stand: Min guard Stand pivot transfers: Min guard       General transfer comment: Assist for safety and lines. intermittent cues for safety    Balance Overall balance assessment: Needs assistance Sitting-balance support: Feet supported Sitting balance-Leahy Scale: Good     Standing balance support: No upper extremity supported Standing balance-Leahy  Scale: Fair                             ADL either performed or assessed with clinical judgement   ADL Overall ADL's : Needs assistance/impaired     Grooming: Oral care;Supervision/safety;Standing;Sitting;Wash/dry hands;Set up   Upper Body Bathing: Set up;Sitting   Lower Body Bathing: Min guard;Sit to/from stand   Upper Body Dressing : Set up;Minimal assistance;Sitting Upper Body Dressing Details (indicate cue type and reason): donning new gown     Toilet Transfer: Min guard;Ambulation;RW;Regular Social worker and Hygiene: Min guard;Sit to/from stand;Sitting/lateral lean       Functional mobility during ADLs: Min guard;Rolling walker General ADL Comments: pt with improvements in activity tolerance today, requires cues for safety; required cues to initiate seated restbreak as pt with elevated HR                       Cognition Arousal/Alertness: Awake/alert Behavior During Therapy: WFL for tasks assessed/performed Overall Cognitive Status: Within Functional Limits for tasks assessed                                 General Comments: pt mildly impulsive today and quick to move - required cues for safety        Exercises     Shoulder Instructions       General Comments discussed with pt option of going home vs rehab initially given pt without additional support/assist at home. pt open to rehab    Pertinent Vitals/ Pain  Pain Assessment: No/denies pain  Home Living                                          Prior Functioning/Environment              Frequency  Min 2X/week        Progress Toward Goals  OT Goals(current goals can now be found in the care plan section)  Progress towards OT goals: Progressing toward goals  Acute Rehab OT Goals Patient Stated Goal: go home at [facility] OT Goal Formulation: With patient Time For Goal Achievement: 03/15/19 Potential to Achieve  Goals: Good ADL Goals Pt Will Perform Grooming: with modified independence;standing Pt Will Perform Lower Body Bathing: with modified independence;with adaptive equipment;sit to/from stand Pt Will Perform Lower Body Dressing: with modified independence;sit to/from stand;with adaptive equipment Pt Will Transfer to Toilet: with modified independence;ambulating Pt Will Perform Toileting - Clothing Manipulation and hygiene: with modified independence;sit to/from stand Additional ADL Goal #1: Pt will demonstrate improved activity tolerance in ability to perform x2 standing ADL tasks prior to requiring seated rest. Additional ADL Goal #2: Pt will verbalize at least 3 energy conservation techniques to utilize during ADL/functional task.  Plan Discharge plan remains appropriate    Co-evaluation                 AM-PAC OT "6 Clicks" Daily Activity     Outcome Measure   Help from another person eating meals?: None Help from another person taking care of personal grooming?: None Help from another person toileting, which includes using toliet, bedpan, or urinal?: A Little Help from another person bathing (including washing, rinsing, drying)?: A Little Help from another person to put on and taking off regular upper body clothing?: A Little Help from another person to put on and taking off regular lower body clothing?: A Little 6 Click Score: 20    End of Session Equipment Utilized During Treatment: Rolling walker  OT Visit Diagnosis: Unsteadiness on feet (R26.81);Muscle weakness (generalized) (M62.81)   Activity Tolerance Patient tolerated treatment well   Patient Left in chair;with call bell/phone within reach   Nurse Communication Mobility status        Time: 8546-2703 OT Time Calculation (min): 34 min  Charges: OT General Charges $OT Visit: 1 Visit OT Treatments $Self Care/Home Management : 23-37 mins  Lou Cal, OT Supplemental Rehabilitation Services Pager  [phone] Office [place] 03/05/2019, 1:58 PM

## 2019-03-05 NOTE — Progress Notes (Signed)
PROGRESS NOTE  [name]  QVZ:[id number] DOB: [birth year] DOA: 02/25/2019 PCP: Corine Shelter, MD   Brief Narrative: [name] is a 73 y.o. female with a history of HTN, dyslipidemia, and GERD who presented to the ED 10/11 by EMS when she appeared to be confused speaking on the phone. EMS reported hypoxia to 73%, placed on 15L NRB and was found to have positive SARS-CoV-2 testing with bilateral interstitial infiltrates. Oxygen was weaned to 4L O2 with remdesivir and steroids provided.  Assessment & Plan: Principal Problem:   Respiratory failure (HCC)/ hypoxic Active Problems:   Pneumonia due to COVID-19 virus   Essential hypertension   Dyslipidemia   GERD (gastroesophageal reflux disease)  Acute hypoxic respiratory failure due to covid-19 pneumonia: Improving, suspect only exertionally hypoxic. Received remdesivir x5 days. - Wean oxygen as tolerated  - CRP continues to decline. No need to continue checking at this time. Will complete steroids 10/20. Note LE U/S and CTA chest ruled out thrombosis/PE. CTA chest also demonstrated typical GGO's without lobar consolidation, pleural effusions, or pulmonary edema.  - Continue airborne, contact precautions. PPE including surgical gown, gloves, cap, shoe covers, and CAPR used during this encounter in a negative pressure room.  - Recommend proning and aggressive use of incentive spirometry.  AKI: Resolved. - Avoid nephrotoxins  Hypokalemia: Resolved. - Monitor and supplement as indicated  HTN:  - Holding antihypertensive with normotension.  LFT elevation: Resolved. No longer checking as remdesivir Tx is completed.  Acute metabolic encephalopathy due to covid-19 infection: Resolved.  Obesity: BMI 39.  - Noted  DVT prophylaxis: Lovenox 0.5mg /kg q12h Code Status: Full Family Communication: Son by phone daily Disposition Plan: PT/OT recommending SNF which the patient and family agree to. D/w CSW. Will recheck covid PCR, if  negative again can broaden search for SNF.    Consultants:   None  Procedures:   None  Antimicrobials:  Remdesivir (10/12 - 10/16)  Subjective: Feels much better in generally, still very winded with exertion, limiting tolerance of mobility. PT and OT feel she requires continued short term rehabilitation at discharge prior to returning home. No chest pain or other complaints.  Objective: Vitals:   03/05/19 0500 03/05/19 0546 03/05/19 0600 03/05/19 0933  BP:  (!) 151/87  127/62  Pulse: 75 79 78 100  Resp: 18 (!) 28 (!) 25 20  Temp:  98.4 F (36.9 C)  98.9 F (37.2 C)  TempSrc:  Oral  Axillary  SpO2: 93% 96% 95% 97%  Weight:      Height:        Intake/Output Summary (Last 24 hours) at 03/05/2019 1152 Last data filed at 03/05/2019 0600 Gross per 24 hour  Intake 480 ml  Output 850 ml  Net -370 ml   Filed Weights   02/25/19 1852 02/26/19 0500  Weight: 102.1 kg 106.2 kg   Gen: 73 y.o. female in no distress Pulm: Nonlabored breathing room air at rest. Crackles bilaterally. CV: Regular rate and rhythm. No murmur, rub, or gallop. No JVD, no dependent edema. GI: Abdomen soft, non-tender, non-distended, with normoactive bowel sounds.  Ext: Warm, no deformities Skin: No rashes, lesions or ulcers on visualized skin. Neuro: Alert and oriented. No focal neurological deficits. Psych: Judgement and insight appear fair. Mood euthymic & affect congruent. Behavior is appropriate.    Data Reviewed: I have personally reviewed following labs and imaging studies  CBC: Recent Labs  Lab 02/27/19 0330 02/28/19 0234 03/01/19 0255 03/02/19 0227 03/03/19 0600 03/05/19 0215  WBC  11.0* 15.0* 13.0* 12.6* 13.7* 13.6*  NEUTROABS 8.9* 12.1* 9.1* 8.8* 9.2*  --   HGB 11.3* 11.2* 11.2* 11.1* 11.1* 11.5*  HCT 37.1 36.1 36.0 37.0 36.3 37.3  MCV 86.7 84.7 85.5 85.8 85.2 85.6  PLT 429* 414* 418* 449* 440* 017*   Basic Metabolic Panel: Recent Labs  Lab 02/28/19 0234 03/01/19 0255  03/02/19 0227 03/03/19 0600 03/05/19 0215  NA 140 139 140 142 140  K 3.6 3.5 3.5 4.1 4.6  CL 101 102 103 106 102  CO2 29 26 26 25 28   GLUCOSE 99 103* 165* 138* 99  BUN 18 17 17 18 16   CREATININE 0.73 0.75 0.73 0.74 0.67  CALCIUM 8.2* 8.2* 8.2* 8.2* 8.4*   Liver Function Tests: Recent Labs  Lab 02/27/19 0330 02/28/19 0234 03/01/19 0255 03/02/19 0227 03/03/19 0600  AST 24 18 22 17 19   ALT 19 15 19 19 18   ALKPHOS 52 57 58 71 63  BILITOT 0.8 0.7 0.5 0.2* 0.9  PROT 6.3* 5.9* 5.8* 5.9* 5.7*  ALBUMIN 2.2* 2.1* 2.1* 2.1* 2.1*   Anemia Panel: Recent Labs    03/03/19 0600  FERRITIN 137   Urine analysis:    Component Value Date/Time   BILIRUBINUR negative 06/03/2015 1024   PROTEINUR trace 06/03/2015 1024   UROBILINOGEN negative 06/03/2015 1024   NITRITE negative 06/03/2015 1024   LEUKOCYTESUR Trace (A) 06/03/2015 1024   Recent Results (from the past 240 hour(s))  SARS Coronavirus 2 by RT PCR (hospital order, performed in [facility] hospital lab) Nasopharyngeal Nasopharyngeal Swab     Status: Abnormal   Collection Time: 02/25/19  7:08 PM   Specimen: Nasopharyngeal Swab  Result Value Ref Range Status   SARS Coronavirus 2 POSITIVE (A) NEGATIVE Final    Comment: RESULT CALLED TO, READ BACK BY AND VERIFIED WITH: FRICKEY,J AT 2336 ON 02/25/2019 BY JPM (NOTE) If result is NEGATIVE SARS-CoV-2 target nucleic acids are NOT DETECTED. The SARS-CoV-2 RNA is generally detectable in upper and lower  respiratory specimens during the acute phase of infection. The lowest  concentration of SARS-CoV-2 viral copies this assay can detect is 250  copies / mL. A negative result does not preclude SARS-CoV-2 infection  and should not be used as the sole basis for treatment or other  patient management decisions.  A negative result may occur with  improper specimen collection / handling, submission of specimen other  than nasopharyngeal swab, presence of viral mutation(s) within the  areas  targeted by this assay, and inadequate number of viral copies  (<250 copies / mL). A negative result must be combined with clinical  observations, patient history, and epidemiological information. If result is POSITIVE SARS-CoV-2 target nucleic acids are DETECTED.  The SARS-CoV-2 RNA is generally detectable in upper and lower  respiratory specimens during the acute phase of infection.  Positive  results are indicative of active infection with SARS-CoV-2.  Clinical  correlation with patient history and other diagnostic information is  necessary to determine patient infection status.  Positive results do  not rule out bacterial infection or co-infection with other viruses. If result is PRESUMPTIVE POSTIVE SARS-CoV-2 nucleic acids MAY BE PRESENT.   A presumptive positive result was obtained on the submitted specimen  and confirmed on repeat testing.  While 2019 novel coronavirus  (SARS-CoV-2) nucleic acids may be present in the submitted sample  additional confirmatory testing may be necessary for epidemiological  and / or clinical management purposes  to differentiate between  SARS-CoV-2 and other  Sarbecovirus currently known to infect humans.  If clinically indicated additional testing with an alternate test  methodology [phone])  is advised. The SARS-CoV-2 RNA is generally  detectable in upper and lower respiratory specimens during the acute  phase of infection. The expected result is Negative. Fact Sheet for Patients:  BoilerBrush.com.cy Fact Sheet for Healthcare Providers: https://pope.com/ This test is not yet approved or cleared by the [place] FDA and has been authorized for detection and/or diagnosis of SARS-CoV-2 by FDA under an Emergency Use Authorization (EUA).  This EUA will remain in effect (meaning this test can be used) for the duration of the COVID-19 declaration under Section 564(b)(1) of the Act, 21 U.S.C. section  360bbb-3(b)(1), unless the authorization is terminated or revoked sooner. Performed at [facility], [address] [place], [place] [address]   Blood Culture (routine x 2)     Status: None   Collection Time: 02/25/19  7:08 PM   Specimen: BLOOD LEFT HAND  Result Value Ref Range Status   Specimen Description   Final    BLOOD LEFT HAND Performed at [facility], [address] [place], [place] [address]    Special Requests   Final    BOTTLES DRAWN AEROBIC AND ANAEROBIC Blood Culture results may not be optimal due to an inadequate volume of blood received in culture bottles Performed at [facility], [address] [place], [place] [address]    Culture   Final    NO GROWTH 5 DAYS Performed at [facility], [address], [place], [place] [address]    Report Status 03/03/2019 FINAL  Final  Blood Culture (routine x 2)     Status: None   Collection Time: 02/25/19  7:13 PM   Specimen: BLOOD LEFT FOREARM  Result Value Ref Range Status   Specimen Description   Final    BLOOD LEFT FOREARM Performed at [facility], [address] [place], [place] [address]    Special Requests   Final    BOTTLES DRAWN AEROBIC AND ANAEROBIC Blood Culture results may not be optimal due to an inadequate volume of blood received in culture bottles Performed at [facility], [address], [place], [place] [address]    Culture   Final    NO GROWTH 5 DAYS Performed at [facility], [address], [place], [place] [address]    Report Status 03/03/2019 FINAL  Final  MRSA PCR Screening     Status: None   Collection Time: 02/26/19  6:50 AM   Specimen: Nasal Mucosa; Nasopharyngeal  Result Value Ref Range Status   MRSA by PCR NEGATIVE NEGATIVE Final    Comment:        The GeneXpert MRSA Assay (FDA approved for NASAL specimens only), is one component of a comprehensive MRSA colonization  surveillance program. It is not intended to diagnose MRSA infection nor to guide or monitor treatment for MRSA infections. Performed at [facility], [address] [place], [place] [address]   SARS CORONAVIRUS 2 (TAT 6-24 HRS) Nasopharyngeal Nasopharyngeal Swab     Status: None   Collection Time: 03/04/19  7:44 AM   Specimen: Nasopharyngeal Swab  Result Value Ref Range Status   SARS Coronavirus 2 NEGATIVE NEGATIVE Final    Comment: (NOTE) SARS-CoV-2 target nucleic acids are NOT DETECTED. The SARS-CoV-2 RNA is generally detectable in upper and lower respiratory specimens during the acute phase of infection. Negative  results do not preclude SARS-CoV-2 infection, do not rule out co-infections with other pathogens, and should not be used as the sole basis for treatment or other patient management decisions. Negative results must be combined with clinical observations, patient history, and epidemiological information. The expected result is Negative. Fact Sheet for Patients: HairSlick.nohttps://www.fda.gov/media/138098/download Fact Sheet for Healthcare Providers: quierodirigir.comhttps://www.fda.gov/media/138095/download This test is not yet approved or cleared by the [place] FDA and  has been authorized for detection and/or diagnosis of SARS-CoV-2 by FDA under an Emergency Use Authorization (EUA). This EUA will remain  in effect (meaning this test can be used) for the duration of the COVID-19 declaration under Section 56 4(b)(1) of the Act, 21 U.S.C. section 360bbb-3(b)(1), unless the authorization is terminated or revoked sooner. Performed at [facility], [address] [place], [place] [address]       Radiology Studies: No results found.  Scheduled Meds: . aspirin EC  81 mg Oral Daily  . dexamethasone (DECADRON) injection  6 mg Intravenous Daily  . [START ON 03/06/2019] enoxaparin (LOVENOX) injection  0.5 mg/kg Subcutaneous Q24H  . pantoprazole  40 mg Oral Daily   . rosuvastatin  5 mg Oral Daily   Continuous Infusions:    LOS: 7 days   Time spent: 25 minutes.  Tyrone Nineyan B Ayodeji Keimig, MD [facility] [email or web address] Password [email or web address] 03/05/2019, 11:52 AM

## 2019-03-05 NOTE — Progress Notes (Signed)
Physical Therapy Treatment Patient Details Name: [name] MRN: [id number] DOB: [birth year] Today's Date: 03/05/2019    History of Present Illness 73 y/o female w/ hx of HTN, GERD, dyslipidemia, presented w/ confusion and hypoxemia    PT Comments    Pt progressing but has low activity tolerance. Do not feel pt has the activity tolerance to manage at home alone and recommend ST-SNF. Pt agreeable.    Follow Up Recommendations  SNF     Equipment Recommendations  None recommended by PT    Recommendations for Other Services       Precautions / Restrictions Precautions Precautions: Fall Restrictions Weight Bearing Restrictions: No    Mobility  Bed Mobility               General bed mobility comments: Pt up in chair  Transfers Overall transfer level: Needs assistance Equipment used: None;Rolling walker (2 wheeled) Transfers: Sit to/from Stand Sit to Stand: Min guard         General transfer comment: Assist for safety and lines. Incr time to rise from commode  Ambulation/Gait Ambulation/Gait assistance: Min guard Gait Distance (Feet): 75 Feet Assistive device: Rolling walker (2 wheeled);None Gait Pattern/deviations: Step-through pattern;Decreased stride length Gait velocity: decr Gait velocity interpretation: 1.31 - 2.62 ft/sec, indicative of limited community ambulator General Gait Details: Assist for balance and safety. Pt guarded and reaching to steady herself when not using walker. Better stability with use of walker. Pt amb on RA with SpO2 decr to 86%. Recovered to >90% after 30 sec.    Stairs             Wheelchair Mobility    Modified Rankin (Stroke Patients Only)       Balance Overall balance assessment: Needs assistance Sitting-balance support: Feet supported Sitting balance-Leahy Scale: Good     Standing balance support: No upper extremity supported Standing balance-Leahy Scale: Fair                               Cognition Arousal/Alertness: Awake/alert Behavior During Therapy: WFL for tasks assessed/performed Overall Cognitive Status: Within Functional Limits for tasks assessed                                        Exercises      General Comments        Pertinent Vitals/Pain Pain Assessment: No/denies pain    Home Living                      Prior Function            PT Goals (current goals can now be found in the care plan section) Progress towards PT goals: Progressing toward goals    Frequency    Min 3X/week      PT Plan Discharge plan needs to be updated    Co-evaluation              AM-PAC PT "6 Clicks" Mobility   Outcome Measure  Help needed turning from your back to your side while in a flat bed without using bedrails?: A Little Help needed moving from lying on your back to sitting on the side of a flat bed without using bedrails?: A Little Help needed moving to and from a bed to a chair (including a wheelchair)?: A Little Help  needed standing up from a chair using your arms (e.g., wheelchair or bedside chair)?: A Little Help needed to walk in hospital room?: A Little Help needed climbing 3-5 steps with a railing? : A Lot 6 Click Score: 17    End of Session   Activity Tolerance: Patient limited by fatigue;Patient tolerated treatment well Patient left: in chair;with call bell/phone within reach   PT Visit Diagnosis: Other abnormalities of gait and mobility (R26.89);Muscle weakness (generalized) (M62.81)     Time: 9628-3662 PT Time Calculation (min) (ACUTE ONLY): 16 min  Charges:  $Gait Training: 8-22 mins                     [facility] PT Acute Rehabilitation Services Pager [phone] Office [phone]    Angelina Ok [facility] 03/05/2019, 11:49 AM

## 2019-03-06 LAB — SARS CORONAVIRUS 2 (TAT 6-24 HRS): SARS Coronavirus 2: POSITIVE — AB

## 2019-03-06 NOTE — Progress Notes (Signed)
Attempted to contact son Zane Herald, left voicemail message.

## 2019-03-06 NOTE — TOC Initial Note (Signed)
Transition of Care [facility]) - Initial/Assessment Note    Patient Details  Name: [name] MRN: [id number] Date of Birth: 01/20/1946  [facility] CM/SW Contact:    Weston Anna, LCSW Phone Number: 03/06/2019, 12:33 PM  Clinical Narrative:                  CSW spoke with patient via phone regarding discharge plans. Patient is currently declining SNF placement due to her birthday coming up and would prefer to be at home. Patient prefers [facility]- referral completed with Tommi Rumps. MD placed orders for PT/OT. DME will need to be arranged as well. CSW will follow up for DME needs and notify Tommi Rumps once patient has discharged.   Expected Discharge Plan: [place] Barriers to Discharge: Continued Medical Work up   Patient Goals and CMS Choice Patient states their goals for this hospitalization and ongoing recovery are:: getting back home for her birthday on 10[birth year]14 CMS Medicare.gov Compare Post Acute Care list provided to:: Patient Choice offered to / list presented to : Patient  Expected Discharge Plan and Services Expected Discharge Plan: [place] In-house Referral: Clinical Social Work   Post Acute Care Choice: Home Health, Durable Medical Equipment Living arrangements for the past 2 months: [place] Expected Discharge Date: 03/06/19                         HH Arranged: PT, OT HH Agency: [facility] Date [place]: 03/06/19 Time HH Agency Contacted: 8 Representative spoke with at [place]: cory  Prior Living Arrangements/Services Living arrangements for the past 2 months: [place] with:: Self Patient language and need for interpreter reviewed:: Yes Do you feel safe going back to the place where you live?: Yes      Need for Family Participation in Patient Care: Yes (Comment) Care giver support system in place?: Yes (comment)      Activities of Daily Living Home Assistive  Devices/Equipment: Eyeglasses ADL Screening (condition at time of admission) Patient's cognitive ability adequate to safely complete daily activities?: Yes Is the patient deaf or have difficulty hearing?: No Does the patient have difficulty seeing, even when wearing glasses/contacts?: No Does the patient have difficulty concentrating, remembering, or making decisions?: No Patient able to express need for assistance with ADLs?: Yes Does the patient have difficulty dressing or bathing?: No Independently performs ADLs?: Yes (appropriate for developmental age) Does the patient have difficulty walking or climbing stairs?: No Weakness of Legs: None Weakness of Arms/Hands: None  Permission Sought/Granted                  Emotional Assessment     Affect (typically observed): Accepting, Calm, Happy, Pleasant Orientation: : Oriented to Self, Oriented to Place, Oriented to  Time, Oriented to Situation   Psych Involvement: No (comment)  Admission diagnosis:  Acute respiratory failure with hypoxia ([place]) [J96.01] COVID-19 virus infection [U07.1] Patient Active Problem List   Diagnosis Date Noted  . Pneumonia due to COVID-19 virus 02/26/2019  . Respiratory failure (HCC)/ hypoxic 02/26/2019  . Essential hypertension 02/26/2019  . Dyslipidemia 02/26/2019  . GERD (gastroesophageal reflux disease) 02/26/2019  . Bunion 08/21/2014  . 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 Onychomycosis 02/28/2013  . Pain, foot 02/28/2013   PCP:  Vincente Liberty, MD Pharmacy:   [facility] [place], [place]  [place] [address] [place] [address] Phone: [phone] Fax: [phone]     Social Determinants of Health (SDOH) Interventions    Readmission Risk Interventions No flowsheet data found.

## 2019-03-06 NOTE — Progress Notes (Signed)
PROGRESS NOTE  [name]  ZOX:[id number] DOB: Mar 18, 1946 DOA: 02/25/2019 PCP: Corine Shelter, MD   Brief Narrative: [name] is a 73 y.o. female with a history of HTN, dyslipidemia, and GERD who presented to the ED 10/11 by EMS when she appeared to be confused speaking on the phone. EMS reported hypoxia to 73%, placed on 15L NRB and was found to have positive SARS-CoV-2 testing with bilateral interstitial infiltrates. Oxygen was weaned to 4L O2 with remdesivir and steroids provided.  Assessment & Plan: Principal Problem:   Respiratory failure (HCC)/ hypoxic Active Problems:   Pneumonia due to COVID-19 virus   Essential hypertension   Dyslipidemia   GERD (gastroesophageal reflux disease)  Acute hypoxic respiratory failure due to covid-19 pneumonia: Improving, suspect only exertionally hypoxic. Received remdesivir x5 days. - Continue OOB, supplemental oxygen as needed. Need to improve conditioning as much as possible.  - CRP continues to decline. No need to continue checking at this time. Has completed steroids. Note LE U/S and CTA chest ruled out thrombosis/PE. CTA chest also demonstrated typical GGO's without lobar consolidation, pleural effusions, or pulmonary edema.  - Continue airborne, contact precautions. PPE including surgical gown, gloves, cap, shoe covers, and CAPR used during this encounter in a negative pressure room.  - Recommend proning and aggressive use of incentive spirometry.  AKI: Resolved. - Avoid nephrotoxins  Hypokalemia: Resolved. - Monitor and supplement as indicated  HTN:  - Holding antihypertensive with normotension.  LFT elevation: Resolved. No longer checking as remdesivir Tx is completed.  Acute metabolic encephalopathy due to covid-19 infection: Resolved.  Obesity: BMI 39.  - Noted  DVT prophylaxis: Lovenox 0.5mg /kg q12h Code Status: Full Family Communication: Spoke with son again this morning, he will help arrange assistance at home.   Disposition Plan: PT/OT recommending SNF though pt declining, has capacity to do so. Will need to arrange transportation, home health and repeat testing. Anticipate DC early 10/21.  Consultants:   None  Procedures:   None  Antimicrobials:  Remdesivir (10/12 - 10/16)  Subjective: Feels well, wants to go home. Has no assistance currently set up. No chest pain or dyspnea. Doesn't want to celebrate her birthday on Saturday in a facility.   Objective: Vitals:   03/06/19 0020 03/06/19 0400 03/06/19 0752 03/06/19 1100  BP: 127/83 129/77 132/64 120/62  Pulse: 81 70 87 (!) 107  Resp: (!) 26 18 (!) 24 20  Temp: 98.5 F (36.9 C) 98.9 F (37.2 C) 98.7 F (37.1 C) 98.3 F (36.8 C)  TempSrc: Oral Oral Oral Oral  SpO2: 98% 98% 94% 97%  Weight:      Height:        Intake/Output Summary (Last 24 hours) at 03/06/2019 1154 Last data filed at 03/06/2019 0904 Gross per 24 hour  Intake 490 ml  Output 2800 ml  Net -2310 ml   Filed Weights   02/25/19 1852 02/26/19 0500  Weight: 102.1 kg 106.2 kg   Gen: 73 y.o. female in no distress Pulm: Nonlabored breathing room air at rest, tachypneic with motion. Crackles much improved bilaterally. CV: Regular rate and rhythm. No murmur, rub, or gallop. No JVD, no dependent edema. GI: Abdomen soft, non-tender, non-distended, with normoactive bowel sounds.  Ext: Warm, no deformities Skin: No rashes, lesions or ulcers on visualized skin. Neuro: Alert and oriented. No focal neurological deficits. Psych: Judgement and insight appear fair. Mood euthymic & affect congruent. Behavior is appropriate. Has capacity to understand medical situation and to make her own medical  decisions.   Data Reviewed: I have personally reviewed following labs and imaging studies  CBC: Recent Labs  Lab 02/28/19 0234 03/01/19 0255 03/02/19 0227 03/03/19 0600 03/05/19 0215  WBC 15.0* 13.0* 12.6* 13.7* 13.6*  NEUTROABS 12.1* 9.1* 8.8* 9.2*  --   HGB 11.2* 11.2* 11.1*  11.1* 11.5*  HCT 36.1 36.0 37.0 36.3 37.3  MCV 84.7 85.5 85.8 85.2 85.6  PLT 414* 418* 449* 440* 485*   Basic Metabolic Panel: Recent Labs  Lab 02/28/19 0234 03/01/19 0255 03/02/19 0227 03/03/19 0600 03/05/19 0215  NA 140 139 140 142 140  K 3.6 3.5 3.5 4.1 4.6  CL 101 102 103 106 102  CO2 29 26 26 25 28   GLUCOSE 99 103* 165* 138* 99  BUN 18 17 17 18 16   CREATININE 0.73 0.75 0.73 0.74 0.67  CALCIUM 8.2* 8.2* 8.2* 8.2* 8.4*   Liver Function Tests: Recent Labs  Lab 02/28/19 0234 03/01/19 0255 03/02/19 0227 03/03/19 0600  AST 18 22 17 19   ALT 15 19 19 18   ALKPHOS 57 58 71 63  BILITOT 0.7 0.5 0.2* 0.9  PROT 5.9* 5.8* 5.9* 5.7*  ALBUMIN 2.1* 2.1* 2.1* 2.1*   Anemia Panel: No results for input(s): VITAMINB12, FOLATE, FERRITIN, TIBC, IRON, RETICCTPCT in the last 72 hours. Urine analysis:    Component Value Date/Time   BILIRUBINUR negative 06/03/2015 1024   PROTEINUR trace 06/03/2015 1024   UROBILINOGEN negative 06/03/2015 1024   NITRITE negative 06/03/2015 1024   LEUKOCYTESUR Trace (A) 06/03/2015 1024   Recent Results (from the past 240 hour(s))  SARS Coronavirus 2 by RT PCR (hospital order, performed [facility]) Nasopharyngeal Nasopharyngeal Swab     Status: Abnormal   Collection Time: 02/25/19  7:08 PM   Specimen: Nasopharyngeal Swab  Result Value Ref Range Status   SARS Coronavirus 2 POSITIVE (A) NEGATIVE Final    Comment: RESULT CALLED TO, READ BACK BY AND VERIFIED WITH: FRICKEY,J AT 2336 ON 02/25/2019 BY JPM (NOTE) If result is NEGATIVE SARS-CoV-2 target nucleic acids are NOT DETECTED. The SARS-CoV-2 RNA is generally detectable in upper and lower  respiratory specimens during the acute phase of infection. The lowest  concentration of SARS-CoV-2 viral copies this assay can detect is 250  copies / mL. A negative result does not preclude SARS-CoV-2 infection  and should not be used as the sole basis for treatment or other  patient management  decisions.  A negative result may occur with  improper specimen collection / handling, submission of specimen other  than nasopharyngeal swab, presence of viral mutation(s) within the  areas targeted by this assay, and inadequate number of viral copies  (<250 copies / mL). A negative result must be combined with clinical  observations, patient history, and epidemiological information. If result is POSITIVE SARS-CoV-2 target nucleic acids are DETECTED.  The SARS-CoV-2 RNA is generally detectable in upper and lower  respiratory specimens during the acute phase of infection.  Positive  results are indicative of active infection with SARS-CoV-2.  Clinical  correlation with patient history and other diagnostic information is  necessary to determine patient infection status.  Positive results do  not rule out bacterial infection or co-infection with other viruses. If result is PRESUMPTIVE POSTIVE SARS-CoV-2 nucleic acids MAY BE PRESENT.   A presumptive positive result was obtained on the submitted specimen  and confirmed on repeat testing.  While 2019 novel coronavirus  (SARS-CoV-2) nucleic acids may be present in the submitted sample  additional confirmatory  testing may be necessary for epidemiological  and / or clinical management purposes  to differentiate between  SARS-CoV-2 and other Sarbecovirus currently known to infect humans.  If clinically indicated additional testing with an alternate test  methodology [phone])  is advised. The SARS-CoV-2 RNA is generally  detectable in upper and lower respiratory specimens during the acute  phase of infection. The expected result is Negative. Fact Sheet for Patients:  BoilerBrush.com.cy Fact Sheet for Healthcare Providers: https://pope.com/ This test is not yet approved or cleared by the [place] FDA and has been authorized for detection and/or diagnosis of SARS-CoV-2 by FDA under an  Emergency Use Authorization (EUA).  This EUA will remain in effect (meaning this test can be used) for the duration of the COVID-19 declaration under Section 564(b)(1) of the Act, 21 U.S.C. section 360bbb-3(b)(1), unless the authorization is terminated or revoked sooner. Performed at [facility], [address] [place], [place] [address]   Blood Culture (routine x 2)     Status: None   Collection Time: 02/25/19  7:08 PM   Specimen: BLOOD LEFT HAND  Result Value Ref Range Status   Specimen Description   Final    BLOOD LEFT HAND Performed at [facility], [address], [place], [place] [address]    Special Requests   Final    BOTTLES DRAWN AEROBIC AND ANAEROBIC Blood Culture results may not be optimal due to an inadequate volume of blood received in culture bottles Performed at [facility], [address] [place], [place] [address]    Culture   Final    NO GROWTH 5 DAYS Performed at [facility], [address], [place], [place] [address]    Report Status 03/03/2019 FINAL  Final  Blood Culture (routine x 2)     Status: None   Collection Time: 02/25/19  7:13 PM   Specimen: BLOOD LEFT FOREARM  Result Value Ref Range Status   Specimen Description   Final    BLOOD LEFT FOREARM Performed at [facility], [address] [place], [place] [address]    Special Requests   Final    BOTTLES DRAWN AEROBIC AND ANAEROBIC Blood Culture results may not be optimal due to an inadequate volume of blood received in culture bottles Performed at [facility], [address] [place], [place] [address]    Culture   Final    NO GROWTH 5 DAYS Performed at [facility], [address], [place], [place] [address]    Report Status 03/03/2019 FINAL  Final  MRSA PCR Screening     Status: None   Collection Time: 02/26/19  6:50 AM   Specimen: Nasal Mucosa; Nasopharyngeal  Result Value Ref Range  Status   MRSA by PCR NEGATIVE NEGATIVE Final    Comment:        The GeneXpert MRSA Assay (FDA approved for NASAL specimens only), is one component of a comprehensive MRSA colonization surveillance program. It is not intended to diagnose MRSA infection nor to guide or monitor treatment for MRSA infections. Performed at [facility], [address] [place], [place] [address]   SARS CORONAVIRUS 2 (TAT 6-24 HRS) Nasopharyngeal Nasopharyngeal Swab     Status: None   Collection Time: 03/04/19  7:44 AM   Specimen: Nasopharyngeal Swab  Result Value Ref Range Status   SARS Coronavirus 2 NEGATIVE NEGATIVE Final    Comment: (NOTE) SARS-CoV-2 target nucleic acids are  NOT DETECTED. The SARS-CoV-2 RNA is generally detectable in upper and lower respiratory specimens during the acute phase of infection. Negative results do not preclude SARS-CoV-2 infection, do not rule out co-infections with other pathogens, and should not be used as the sole basis for treatment or other patient management decisions. Negative results must be combined with clinical observations, patient history, and epidemiological information. The expected result is Negative. Fact Sheet for Patients: SugarRoll.be Fact Sheet for Healthcare Providers: https://www.woods-mathews.com/ This test is not yet approved or cleared by the [place] FDA and  has been authorized for detection and/or diagnosis of SARS-CoV-2 by FDA under an Emergency Use Authorization (EUA). This EUA will remain  in effect (meaning this test can be used) for the duration of the COVID-19 declaration under Section 56 4(b)(1) of the Act, 21 U.S.C. section 360bbb-3(b)(1), unless the authorization is terminated or revoked sooner. Performed at [facility], [place] [address], [place], [place] [address]       Radiology Studies: No results found.  Scheduled Meds: . aspirin EC  81 mg Oral  Daily  . enoxaparin (LOVENOX) injection  0.5 mg/kg Subcutaneous Q24H  . pantoprazole  40 mg Oral Daily  . rosuvastatin  5 mg Oral Daily   Continuous Infusions:    LOS: 8 days   Time spent: 25 minutes.  Patrecia Pour, MD [facility] [email or web address] Password [email or web address] 03/06/2019, 11:54 AM

## 2019-03-06 NOTE — Discharge Instructions (Signed)

## 2019-03-07 NOTE — Progress Notes (Signed)
Patient discharge home via [facility] as arranged by Education officer, museum. IV, external catheter and monitor removed prior to discharge. AVS provided and questions answered.

## 2019-03-07 NOTE — TOC Transition Note (Signed)
Transition of Care [facility]) - CM/SW Discharge Note   Patient Details  Name: [name] MRN: [id number] Date of Birth: [birth year]  [facility] CM/SW Contact:  Weston Anna, LCSW Phone Number: 03/07/2019, 10:19 AM   Clinical Narrative:     Patient set to discharge back home. DME to be picked up by som today from [place]- patient and family aware of this. Alvis Lemmings has accepted referral and able to start [facility] services- no other needs at this time.   Final next level of care: Home w Home Health Services Barriers to Discharge: No Barriers Identified   Patient Goals and CMS Choice Patient states their goals for this hospitalization and ongoing recovery are:: getting back home for her [birth year] on [birth year] CMS Medicare.gov Compare Post Acute Care list provided to:: Patient Choice offered to / list presented to : Patient  Discharge Placement                Patient to be transferred to facility by: [place] Name of family member notified: patient aware and oriented    Discharge Plan and Services In-house Referral: Clinical Social Work   Post Acute Care Choice: Home Health, Durable Medical Equipment          DME Arranged: Tub bench, 3-N-1 DME Agency: [employer] Date DME Agency Contacted: 03/06/19 Time DME Agency Contacted: 50 Representative spoke with at DME Agency: [place]: PT, OT [place] Agency: [place] Date [place]: 03/06/19 Time [place]: 1200 Representative spoke with at [place]: cory  Social Determinants of Health ([place]) Interventions     Readmission Risk Interventions No flowsheet data found.

## 2019-03-07 NOTE — Discharge Summary (Signed)
Physician Discharge Summary  [name] WGN:[id number] DOB: 15-Jan-1946 DOA: 02/25/2019  PCP: Corine Shelter, MD  Admit date: 02/25/2019 Discharge date: 03/07/2019  Admitted From: Home Disposition: Home   Recommendations for Outpatient Follow-up:  1. Follow up with PCP in 1-2 weeks 2. Please obtain CMP/CBC in one week  Home Health: PT, OT; RN and aide unable to be arranged Equipment/Devices: Tub bench Discharge Condition: Stable, improved CODE STATUS: Full Diet recommendation: Heart healthy  Brief/Interim Summary: [name] is a 73 y.o. female with a history of HTN, dyslipidemia, and GERD who presented to the ED 10/11 by EMS when she appeared to be confused speaking on the phone. EMS reported hypoxia to 73%, placed on 15L NRB and was found to have positive SARS-CoV-2 testing with bilateral interstitial infiltrates. Oxygen was weaned to 4L O2 with remdesivir and steroids provided and subsequently off oxygen. Respiratory status remained stable with plans to discharge to rehabilitation facility for short term strengthening prior to patient returning home. The patient has declined this recommendation and wishes to discharge home. Home health is maximized, and patient's strength and exertional capacity is improving at time of discharge.   Discharge Diagnoses:  Principal Problem:   Respiratory failure (HCC)/ hypoxic Active Problems:   Pneumonia due to COVID-19 virus   Essential hypertension   Dyslipidemia   GERD (gastroesophageal reflux disease)  Acute hypoxic respiratory failure due to covid-19 pneumonia: No longer hypoxic. Received remdesivir x5 days and steroids x10 days.  - CRP continues to decline. No need to continue checking at this time. Note elevated d-dimer in setting of covid. LE U/S and CTA chest ruled out thrombosis/PE. CTA chest also demonstrated typical GGO's without lobar consolidation, pleural effusions, or pulmonary edema.  - Continue isolation per CDC  guidelines - Recommend continued use of incentive spirometry.  AKI: Resolved. - Avoid nephrotoxins  Hypokalemia: Resolved. - Monitor and supplement as indicated  HTN:  - Restart home medications  LFT elevation: Resolved. No longer checking as remdesivir Tx is completed.  Acute metabolic encephalopathy due to covid-19 infection: Resolved.  Obesity: BMI 39.  - Noted  Discharge Instructions Discharge Instructions    Diet - low sodium heart healthy   Complete by: As directed    Discharge instructions   Complete by: As directed    You are being discharged from the hospital after treatment for covid-19 infection. You are felt to be stable enough to no longer require inpatient monitoring, testing, and treatment, though you will need to follow the recommendations below: - You have completed therapy targeting covid-19 pneumonia and have been liberated from supplemental oxygen. Short term rehabilitation at a facility is recommended. While it is felt to be less preferred, you are felt to be safe to return home and home health services have been arranged. - Remain in self-isolation for 14 days following discharge to reduce risk of transmission of the virus.  - Do not take NSAID medications (including, but not limited to, ibuprofen, advil, motrin, naproxen, aleve, goody's powder, etc.) - Follow up with your doctor in the next week via telehealth or seek medical attention right away if your symptoms get WORSE.  - Consider donating plasma after you have recovered (either 14 days after a negative test or 28 days after symptoms have completely resolved) because your antibodies to this virus may be helpful to give to others with life-threatening infections. Please go to the website www.[email or web address] if you would like to consider volunteering for plasma donation.    Directions for  you at home:  Wear a facemask You should wear a facemask that covers your nose and mouth when you are in the same  room with other people and when you visit a healthcare provider. People who live with or visit you should also wear a facemask while they are in the same room with you.  Separate yourself from other people in your home As much as possible, you should stay in a different room from other people in your home. Also, you should use a separate bathroom, if available.  Avoid sharing household items You should not share dishes, drinking glasses, cups, eating utensils, towels, bedding, or other items with other people in your home. After using these items, you should wash them thoroughly with soap and water.  Cover your coughs and sneezes Cover your mouth and nose with a tissue when you cough or sneeze, or you can cough or sneeze into your sleeve. Throw used tissues in a lined trash can, and immediately wash your hands with soap and water for at least 20 seconds or use an alcohol-based hand rub.  Wash your [facility] your hands often and thoroughly with soap and water for at least 20 seconds. You can use an alcohol-based hand sanitizer if soap and water are not available and if your hands are not visibly dirty. Avoid touching your eyes, nose, and mouth with unwashed hands.  Directions for those who live with, or provide care at home for you:  Limit the number of people who have contact with the patient If possible, have only one caregiver for the patient. Other household members should stay in another home or place of residence. If this is not possible, they should stay in another room, or be separated from the patient as much as possible. Use a separate bathroom, if available. Restrict visitors who do not have an essential need to be in the home.  Ensure good ventilation Make sure that shared spaces in the home have good air flow, such as from an air conditioner or an opened window, weather permitting.  Wash your hands often Wash your hands often and thoroughly with soap and water for at  least 20 seconds. You can use an alcohol based hand sanitizer if soap and water are not available and if your hands are not visibly dirty. Avoid touching your eyes, nose, and mouth with unwashed hands. Use disposable paper towels to dry your hands. If not available, use dedicated cloth towels and replace them when they become wet.  Wear a facemask and gloves Wear a disposable facemask at all times in the room and gloves when you touch or have contact with the patient's blood, body fluids, and/or secretions or excretions, such as sweat, saliva, sputum, nasal mucus, vomit, urine, or feces.  Ensure the mask fits over your nose and mouth tightly, and do not touch it during use. Throw out disposable facemasks and gloves after using them. Do not reuse. Wash your hands immediately after removing your facemask and gloves. If your personal clothing becomes contaminated, carefully remove clothing and launder. Wash your hands after handling contaminated clothing. Place all used disposable facemasks, gloves, and other waste in a lined container before disposing them with other household waste. Remove gloves and wash your hands immediately after handling these items.  Do not share dishes, glasses, or other household items with the patient Avoid sharing household items. You should not share dishes, drinking glasses, cups, eating utensils, towels, bedding, or other items with a patient who  is confirmed to have, or being evaluated for, COVID-19 infection. After the person uses these items, you should wash them thoroughly with soap and water.  Wash laundry thoroughly Immediately remove and wash clothes or bedding that have blood, body fluids, and/or secretions or excretions, such as sweat, saliva, sputum, nasal mucus, vomit, urine, or feces, on them. Wear gloves when handling laundry from the patient. Read and follow directions on labels of laundry or clothing items and detergent. In general, wash and dry with the  warmest temperatures recommended on the label.  Clean all areas the individual has used often Clean all touchable surfaces, such as counters, tabletops, doorknobs, bathroom fixtures, toilets, phones, keyboards, tablets, and bedside tables, every day. Also, clean any surfaces that may have blood, body fluids, and/or secretions or excretions on them. Wear gloves when cleaning surfaces the patient has come in contact with. Use a diluted bleach solution (e.g., dilute bleach with 1 part bleach and 10 parts water) or a household disinfectant with a label that says EPA-registered for coronaviruses. To make a bleach solution at home, add 1 tablespoon of bleach to 1 quart (4 cups) of water. For a larger supply, add  cup of bleach to 1 gallon (16 cups) of water. Read labels of cleaning products and follow recommendations provided on product labels. Labels contain instructions for safe and effective use of the cleaning product including precautions you should take when applying the product, such as wearing gloves or eye protection and making sure you have good ventilation during use of the product. Remove gloves and wash hands immediately after cleaning.  Monitor yourself for signs and symptoms of illness Caregivers and household members are considered close contacts, should monitor their health, and will be asked to limit movement outside of the home to the extent possible. Follow the monitoring steps for close contacts listed on the symptom monitoring form.  If you have additional questions, contact your local health department or call the epidemiologist on call at [phone] (available 24/7). This guidance is subject to change. For the most up-to-date guidance from [facility], please refer to their website: [email or web address].hu   Increase activity slowly   Complete by: As directed    MyChart COVID-19 home monitoring program   Complete by: Mar 07, 2019     Is the patient willing to use the MyChart Mobile App for home monitoring?: Yes   Temperature monitoring   Complete by: Mar 07, 2019    After how many days would you like to receive a notification of this patient's flowsheet entries?: 1     Allergies as of 03/07/2019      Reactions   Shellfish-derived Products Other (See Comments)   Shrimp and lobster--severe swelling/itching   Codeine Nausea And Vomiting   Tetracyclines & Related Nausea And Vomiting   Vibramycin [doxycycline Calcium] Nausea And Vomiting      Medication List    STOP taking these medications   clindamycin 150 MG capsule Commonly known as: CLEOCIN   ibuprofen 600 MG tablet Commonly known as: ADVIL   methocarbamol 500 MG tablet Commonly known as: ROBAXIN   traMADol 50 MG tablet Commonly known as: ULTRAM     TAKE these medications   aspirin EC 81 MG tablet Take 81 mg by mouth daily.   Diethylpropion HCl CR 75 MG Tb24 Take 75 mg by mouth daily. Reported on 06/03/2015   hydrochlorothiazide 12.5 MG tablet Commonly known as: HYDRODIURIL Take 12.5 mg by mouth daily.   losartan 50 MG tablet Commonly  known as: COZAAR Take 50 mg by mouth daily.   omeprazole 40 MG capsule Commonly known as: PRILOSEC Take 40 mg by mouth daily.   rosuvastatin 5 MG tablet Commonly known as: CRESTOR Take 5 mg by mouth daily.            Durable Medical Equipment  (From admission, onward)         Start     Ordered   03/06/19 1155  For home use only DME 3 n 1  Once     03/06/19 1154   03/06/19 1155  For home use only DME Tub bench  Once     03/06/19 1154         Follow-up Information    Corine Shelter, MD. Schedule an appointment as soon as possible for a visit.   Specialty: Pulmonary Disease Contact information: [address] [place] [address] [phone]          Allergies  Allergen Reactions  . Shellfish-Derived Products Other (See Comments)    Shrimp and lobster--severe  swelling/itching  . Codeine Nausea And Vomiting  . Tetracyclines & Related Nausea And Vomiting  . Vibramycin [Doxycycline Calcium] Nausea And Vomiting    Consultations:  None  Procedures/Studies: Ct Angio Chest Pe W Or Wo Contrast  Result Date: 03/01/2019 CLINICAL DATA:  Elevated D-dimer level. EXAM: CT ANGIOGRAPHY CHEST WITH CONTRAST TECHNIQUE: Multidetector CT imaging of the chest was performed using the standard protocol during bolus administration of intravenous contrast. Multiplanar CT image reconstructions and MIPs were obtained to evaluate the vascular anatomy. CONTRAST:  OMNIPAQUE IOHEXOL 350 MG/ML SOLN COMPARISON:  February 25, 2019. FINDINGS: Cardiovascular: Satisfactory opacification of the pulmonary arteries to the segmental level. No evidence of pulmonary embolism. Normal heart size. No pericardial effusion. Atherosclerosis of thoracic aorta is noted without aneurysm or dissection. Mediastinum/Nodes: No enlarged mediastinal, hilar, or axillary lymph nodes. Thyroid gland, trachea, and esophagus demonstrate no significant findings. Lungs/Pleura: No pneumothorax or pleural effusion is noted. Multiple airspace opacities are noted diffusely throughout both lungs most consistent with multifocal pneumonia. Upper Abdomen: No acute abnormality. Musculoskeletal: No chest wall abnormality. No acute or significant osseous findings. Review of the MIP images confirms the above findings. IMPRESSION: No definite evidence of pulmonary embolus. Multiple airspace opacities are noted bilaterally consistent with multifocal pneumonia. Aortic Atherosclerosis (ICD10-I70.0). Electronically Signed   By: Lupita Raider M.D.   On: 03/01/2019 14:56   Dg Chest Port 1 View  Result Date: 02/25/2019 CLINICAL DATA:  Short of breath and cough for 1 week. Possible COVID-19 exposure at church. EXAM: PORTABLE CHEST 1 VIEW COMPARISON:  06/17/2009 FINDINGS: Patient rotated minimally right. Normal heart size. Apparent  superior mediastinal soft tissue fullness could be due to AP portable technique and low lung volumes. No pleural effusion or pneumothorax. Multifocal bilateral interstitial and airspace opacities. IMPRESSION: Bilateral interstitial and airspace opacities. Given clinical history, favor atypical/viral pneumonia as can be seen with COVID-19 pneumonia. Pulmonary edema felt less likely, given lack of significant pleural fluid. Apparent soft tissue fullness within the superior mediastinum could be technique related. Cannot exclude adenopathy. Recommend attention on follow-up. Electronically Signed   By: Jeronimo Greaves M.D.   On: 02/25/2019 20:07   Vas [place] Lower Extremity Venous (dvt)  Result Date: 03/01/2019  Lower Venous Study Indications: Edema.  Risk Factors: None identified. Limitations: Body habitus and poor ultrasound/tissue interface. Comparison Study: No prior studies. Performing Technologist: Chanda Busing RVT  Examination Guidelines: A complete evaluation includes B-mode imaging, spectral  Doppler, color Doppler, and power Doppler as needed of all accessible portions of each vessel. Bilateral testing is considered an integral part of a complete examination. Limited examinations for reoccurring indications may be performed as noted.  +---------+---------------+---------+-----------+----------+--------------+ RIGHT    CompressibilityPhasicitySpontaneityPropertiesThrombus Aging +---------+---------------+---------+-----------+----------+--------------+ CFV      Full           Yes      Yes                                 +---------+---------------+---------+-----------+----------+--------------+ SFJ      Full                                                        +---------+---------------+---------+-----------+----------+--------------+ FV Prox  Full                                                        +---------+---------------+---------+-----------+----------+--------------+ FV  Mid   Full                                                        +---------+---------------+---------+-----------+----------+--------------+ FV DistalFull                                                        +---------+---------------+---------+-----------+----------+--------------+ PFV      Full                                                        +---------+---------------+---------+-----------+----------+--------------+ POP      Full           Yes      Yes                                 +---------+---------------+---------+-----------+----------+--------------+ PTV      Full                                                        +---------+---------------+---------+-----------+----------+--------------+ PERO     Full                                                        +---------+---------------+---------+-----------+----------+--------------+   +---------+---------------+---------+-----------+----------+--------------+ LEFT     CompressibilityPhasicitySpontaneityPropertiesThrombus Aging +---------+---------------+---------+-----------+----------+--------------+  CFV      Full           Yes      Yes                                 +---------+---------------+---------+-----------+----------+--------------+ SFJ      Full                                                        +---------+---------------+---------+-----------+----------+--------------+ FV Prox  Full                                                        +---------+---------------+---------+-----------+----------+--------------+ FV Mid   Full                                                        +---------+---------------+---------+-----------+----------+--------------+ FV DistalFull                                                        +---------+---------------+---------+-----------+----------+--------------+ PFV      Full                                                         +---------+---------------+---------+-----------+----------+--------------+ POP      Full           Yes      Yes                                 +---------+---------------+---------+-----------+----------+--------------+ PTV      Full                                                        +---------+---------------+---------+-----------+----------+--------------+ PERO     Full                                                        +---------+---------------+---------+-----------+----------+--------------+     Summary: Right: There is no evidence of deep vein thrombosis in the lower extremity. No cystic structure found in the popliteal fossa. Left: There is no evidence of deep vein thrombosis in the lower extremity. However, portions of this examination were limited- see technologist comments above. No cystic structure found in  the popliteal fossa.  *See table(s) above for measurements and observations. Electronically signed by Lemar Livings MD on 03/01/2019 at 3:44:43 PM.    Final     Subjective: Feels well, ready to go home. No dyspnea when getting up and walking to bathroom, no pain, eating ok.   Discharge Exam: Vitals:   03/07/19 0415 03/07/19 0800  BP: 140/82 133/81  Pulse: 77 98  Resp: 20 20  Temp: 98.8 F (37.1 C) 98 F (36.7 C)  SpO2: 95% 92%   General: Pt is alert, awake, not in acute distressN Cardiovascular: RRR, S1/S2 +, no rubs, no gallops Respiratory: CTA bilaterally, no wheezing, no rhonchi Abdominal: Soft, NT, ND, bowel sounds + Extremities: No edema, no cyanosis  Labs: BNP (last 3 results) No results for input(s): BNP in the last 8760 hours. Basic Metabolic Panel: Recent Labs  Lab 03/01/19 0255 03/02/19 0227 03/03/19 0600 03/05/19 0215  NA 139 140 142 140  K 3.5 3.5 4.1 4.6  CL 102 103 106 102  CO2 26 26 25 28   GLUCOSE 103* 165* 138* 99  BUN 17 17 18 16   CREATININE 0.75 0.73 0.74 0.67  CALCIUM 8.2* 8.2* 8.2* 8.4*    Liver Function Tests: Recent Labs  Lab 03/01/19 0255 03/02/19 0227 03/03/19 0600  AST 22 17 19   ALT 19 19 18   ALKPHOS 58 71 63  BILITOT 0.5 0.2* 0.9  PROT 5.8* 5.9* 5.7*  ALBUMIN 2.1* 2.1* 2.1*   No results for input(s): LIPASE, AMYLASE in the last 168 hours. No results for input(s): AMMONIA in the last 168 hours. CBC: Recent Labs  Lab 03/01/19 0255 03/02/19 0227 03/03/19 0600 03/05/19 0215  WBC 13.0* 12.6* 13.7* 13.6*  NEUTROABS 9.1* 8.8* 9.2*  --   HGB 11.2* 11.1* 11.1* 11.5*  HCT 36.0 37.0 36.3 37.3  MCV 85.5 85.8 85.2 85.6  PLT 418* 449* 440* 485*   Cardiac Enzymes: No results for input(s): CKTOTAL, CKMB, CKMBINDEX, TROPONINI in the last 168 hours. BNP: Invalid input(s): POCBNP CBG: No results for input(s): GLUCAP in the last 168 hours. D-Dimer No results for input(s): DDIMER in the last 72 hours. Hgb A1c No results for input(s): HGBA1C in the last 72 hours. Lipid Profile No results for input(s): CHOL, HDL, LDLCALC, TRIG, CHOLHDL, LDLDIRECT in the last 72 hours. Thyroid function studies No results for input(s): TSH, T4TOTAL, T3FREE, THYROIDAB in the last 72 hours.  Invalid input(s): FREET3 Anemia work up No results for input(s): VITAMINB12, FOLATE, FERRITIN, TIBC, IRON, RETICCTPCT in the last 72 hours. Urinalysis    Component Value Date/Time   BILIRUBINUR negative 06/03/2015 1024   PROTEINUR trace 06/03/2015 1024   UROBILINOGEN negative 06/03/2015 1024   NITRITE negative 06/03/2015 1024   LEUKOCYTESUR Trace (A) 06/03/2015 1024    Microbiology Recent Results (from the past 240 hour(s))  SARS Coronavirus 2 by RT PCR (hospital order, performed in [facility] hospital lab) Nasopharyngeal Nasopharyngeal Swab     Status: Abnormal   Collection Time: 02/25/19  7:08 PM   Specimen: Nasopharyngeal Swab  Result Value Ref Range Status   SARS Coronavirus 2 POSITIVE (A) NEGATIVE Final    Comment: RESULT CALLED TO, READ BACK BY AND VERIFIED WITH: FRICKEY,J AT  2336 ON 02/25/2019 BY JPM (NOTE) If result is NEGATIVE SARS-CoV-2 target nucleic acids are NOT DETECTED. The SARS-CoV-2 RNA is generally detectable in upper and lower  respiratory specimens during the acute phase of infection. The lowest  concentration of SARS-CoV-2 viral copies this assay can detect is  250  copies / mL. A negative result does not preclude SARS-CoV-2 infection  and should not be used as the sole basis for treatment or other  patient management decisions.  A negative result may occur with  improper specimen collection / handling, submission of specimen other  than nasopharyngeal swab, presence of viral mutation(s) within the  areas targeted by this assay, and inadequate number of viral copies  (<250 copies / mL). A negative result must be combined with clinical  observations, patient history, and epidemiological information. If result is POSITIVE SARS-CoV-2 target nucleic acids are DETECTED.  The SARS-CoV-2 RNA is generally detectable in upper and lower  respiratory specimens during the acute phase of infection.  Positive  results are indicative of active infection with SARS-CoV-2.  Clinical  correlation with patient history and other diagnostic information is  necessary to determine patient infection status.  Positive results do  not rule out bacterial infection or co-infection with other viruses. If result is PRESUMPTIVE POSTIVE SARS-CoV-2 nucleic acids MAY BE PRESENT.   A presumptive positive result was obtained on the submitted specimen  and confirmed on repeat testing.  While 2019 novel coronavirus  (SARS-CoV-2) nucleic acids may be present in the submitted sample  additional confirmatory testing may be necessary for epidemiological  and / or clinical management purposes  to differentiate between  SARS-CoV-2 and other Sarbecovirus currently known to infect humans.  If clinically indicated additional testing with an alternate test  methodology [phone])  is  advised. The SARS-CoV-2 RNA is generally  detectable in upper and lower respiratory specimens during the acute  phase of infection. The expected result is Negative. Fact Sheet for Patients:  BoilerBrush.com.cy Fact Sheet for Healthcare Providers: https://pope.com/ This test is not yet approved or cleared by the [place] FDA and has been authorized for detection and/or diagnosis of SARS-CoV-2 by FDA under an Emergency Use Authorization (EUA).  This EUA will remain in effect (meaning this test can be used) for the duration of the COVID-19 declaration under Section 564(b)(1) of the Act, 21 U.S.C. section 360bbb-3(b)(1), unless the authorization is terminated or revoked sooner. Performed at [facility], [address], [place], [place] [address]   Blood Culture (routine x 2)     Status: None   Collection Time: 02/25/19  7:08 PM   Specimen: BLOOD LEFT HAND  Result Value Ref Range Status   Specimen Description   Final    BLOOD LEFT HAND Performed at [facility], [address], [place], [place] [address]    Special Requests   Final    BOTTLES DRAWN AEROBIC AND ANAEROBIC Blood Culture results may not be optimal due to an inadequate volume of blood received in culture bottles Performed at [facility], [address], [place], [place] [address]    Culture   Final    NO GROWTH 5 DAYS Performed at [facility], [address] [place], [place] [address]    Report Status 03/03/2019 FINAL  Final  Blood Culture (routine x 2)     Status: None   Collection Time: 02/25/19  7:13 PM   Specimen: BLOOD LEFT FOREARM  Result Value Ref Range Status   Specimen Description   Final    BLOOD LEFT FOREARM Performed at [facility], [address] [place], [place] [address]    Special Requests   Final    BOTTLES DRAWN AEROBIC AND ANAEROBIC Blood Culture results may  not be optimal  due to an inadequate volume of blood received in culture bottles Performed at [facility] [address] [place], [place] [address]    Culture   Final    NO GROWTH 5 DAYS Performed at [facility], [address] [place], [place] [address]    Report Status 03/03/2019 FINAL  Final  MRSA PCR Screening     Status: None   Collection Time: 02/26/19  6:50 AM   Specimen: Nasal Mucosa; Nasopharyngeal  Result Value Ref Range Status   MRSA by PCR NEGATIVE NEGATIVE Final    Comment:        The GeneXpert MRSA Assay (FDA approved for NASAL specimens only), is one component of a comprehensive MRSA colonization surveillance program. It is not intended to diagnose MRSA infection nor to guide or monitor treatment for MRSA infections. Performed at [facility], [address], [place], [place] [address]   SARS CORONAVIRUS 2 (TAT 6-24 HRS) Nasopharyngeal Nasopharyngeal Swab     Status: None   Collection Time: 03/04/19  7:44 AM   Specimen: Nasopharyngeal Swab  Result Value Ref Range Status   SARS Coronavirus 2 NEGATIVE NEGATIVE Final    Comment: (NOTE) SARS-CoV-2 target nucleic acids are NOT DETECTED. The SARS-CoV-2 RNA is generally detectable in upper and lower respiratory specimens during the acute phase of infection. Negative results do not preclude SARS-CoV-2 infection, do not rule out co-infections with other pathogens, and should not be used as the sole basis for treatment or other patient management decisions. Negative results must be combined with clinical observations, patient history, and epidemiological information. The expected result is Negative. Fact Sheet for Patients: HairSlick.no Fact Sheet for Healthcare Providers: quierodirigir.com This test is not yet approved or cleared by the [place] FDA and  has been authorized for detection and/or diagnosis of  SARS-CoV-2 by FDA under an Emergency Use Authorization (EUA). This EUA will remain  in effect (meaning this test can be used) for the duration of the COVID-19 declaration under Section 56 4(b)(1) of the Act, 21 U.S.C. section 360bbb-3(b)(1), unless the authorization is terminated or revoked sooner. Performed at [facility], [address], [place], [place] [address]   SARS CORONAVIRUS 2 (TAT 6-24 HRS)     Status: Abnormal   Collection Time: 03/06/19  1:20 PM  Result Value Ref Range Status   SARS Coronavirus 2 POSITIVE (A) NEGATIVE Final    Comment: RESULT CALLED TO, READ BACK BY AND VERIFIED WITH: I.BLANCHERD,RN 2022 [id number] I.MANNING (NOTE) SARS-CoV-2 target nucleic acids are DETECTED. The SARS-CoV-2 RNA is generally detectable in upper and lower respiratory specimens during the acute phase of infection. Positive results are indicative of active infection with SARS-CoV-2. Clinical  correlation with patient history and other diagnostic information is necessary to determine patient infection status. Positive results do  not rule out bacterial infection or co-infection with other viruses. The expected result is Negative. Fact Sheet for Patients: HairSlick.no Fact Sheet for Healthcare Providers: quierodirigir.com This test is not yet approved or cleared by the [place] FDA and  has been authorized for detection and/or diagnosis of SARS-CoV-2 by FDA under an Emergency Use Authorization (EUA). This EUA will remain  in effect (meaning this test can be used) fo r the duration of the COVID-19 declaration under Section 564(b)(1) of the Act, 21 U.S.C. section 360bbb-3(b)(1), unless the authorization is terminated or revoked sooner. Performed at [facility], [address] [place], [place] [address]  Time coordinating discharge: Approximately 40 minutes  Tyrone Nine, MD  [facility] 03/07/2019,  9:11 AM

## 2020-01-17 ENCOUNTER — Other Ambulatory Visit: Payer: Self-pay

## 2020-01-17 ENCOUNTER — Other Ambulatory Visit (HOSPITAL_COMMUNITY): Payer: Self-pay | Admitting: Pulmonary Disease

## 2020-01-17 ENCOUNTER — Ambulatory Visit (HOSPITAL_COMMUNITY)
Admission: RE | Admit: 2020-01-17 | Discharge: 2020-01-17 | Disposition: A | Discharge status: Home / Self Care | Payer: Medicare PPO | Source: Ambulatory Visit | Attending: Pulmonary Disease | Admitting: Pulmonary Disease

## 2020-01-17 DIAGNOSIS — M545 Low back pain, unspecified: Secondary | ICD-10-CM

## 2020-03-26 ENCOUNTER — Ambulatory Visit (INDEPENDENT_AMBULATORY_CARE_PROVIDER_SITE_OTHER): Payer: Medicare PPO

## 2020-03-26 ENCOUNTER — Ambulatory Visit (INDEPENDENT_AMBULATORY_CARE_PROVIDER_SITE_OTHER): Payer: Medicare PPO | Admitting: Orthopaedic Surgery

## 2020-03-26 ENCOUNTER — Encounter: Payer: Self-pay | Admitting: Orthopaedic Surgery

## 2020-03-26 ENCOUNTER — Other Ambulatory Visit: Payer: Self-pay

## 2020-03-26 ENCOUNTER — Ambulatory Visit: Payer: Self-pay

## 2020-03-26 VITALS — Ht 63.0 in | Wt 242.4 lb

## 2020-03-26 DIAGNOSIS — M25561 Pain in right knee: Secondary | ICD-10-CM | POA: Diagnosis not present

## 2020-03-26 DIAGNOSIS — M1712 Unilateral primary osteoarthritis, left knee: Secondary | ICD-10-CM

## 2020-03-26 DIAGNOSIS — M1711 Unilateral primary osteoarthritis, right knee: Secondary | ICD-10-CM

## 2020-03-26 DIAGNOSIS — M25562 Pain in left knee: Secondary | ICD-10-CM

## 2020-03-26 DIAGNOSIS — Z96659 Presence of unspecified artificial knee joint: Secondary | ICD-10-CM

## 2020-03-26 MED ORDER — LIDOCAINE HCL 1 % IJ SOLN
3.0000 mL | INTRAMUSCULAR | Status: AC | PRN
  Administered 2020-03-26: 3 mL

## 2020-03-26 MED ORDER — METHYLPREDNISOLONE ACETATE 40 MG/ML IJ SUSP
40.0000 mg | INTRAMUSCULAR | Status: AC | PRN
  Administered 2020-03-26: 40 mg via INTRA_ARTICULAR

## 2020-03-26 NOTE — Progress Notes (Signed)
Office Visit Note   Patient: [name]           Date of Birth: 08-Feb-1946           MRN: [id number] Visit Date: 03/26/2020              Requested by: Corine Shelter, MD [address] [place],  [place] [address] PCP: Corine Shelter, MD   Assessment & Plan: Visit Diagnoses:  1. Left knee pain, unspecified chronicity   2. Right knee pain, unspecified chronicity   3. Unilateral primary osteoarthritis, left knee   4. Unilateral primary osteoarthritis, right knee   5. History of partial knee replacement     Plan: Per the patient's request I did place a steroid injection in both knees.  I again reiterated the fact that I felt before that she was a candidate for total knee arthroplasties and that partial knee arthroplasties given the significance of her tricompartmental arthritis combined with her weight.  I do believe these partial knees are failing and she has developed severe arthritis in the other compartments of both knees.  At some point she may consider revision to total knee arthroplasty.  For now she wants to try to stay as conservative as possible.  She understands that certainly with continued steroid injections in the knees this could cause worsening of the prosthesis himself.  All questions and concerns were answered and addressed.  We can always repeat injections in 3 to 4 months if needed.  Follow-Up Instructions: Return if symptoms worsen or fail to improve.   Orders:  Orders Placed This Encounter  Procedures  . Large Joint Inj  . Large Joint Inj  . XR Knee 1-2 Views Left  . XR Knee 1-2 Views Right   No orders of the defined types were placed in this encounter.     Procedures: Large Joint Inj: R knee on 03/26/2020 11:11 AM Indications: diagnostic evaluation and pain Details: 22 G 1.5 in needle, superolateral approach  Arthrogram: No  Medications: 3 mL lidocaine 1 %; 40 mg methylPREDNISolone acetate 40 MG/ML Outcome: tolerated well, no immediate  complications Procedure, treatment alternatives, risks and benefits explained, specific risks discussed. Consent was given by the patient. Immediately prior to procedure a time out was called to verify the correct patient, procedure, equipment, support staff and site/side marked as required. Patient was prepped and draped in the usual sterile fashion.   Large Joint Inj: L knee on 03/26/2020 11:11 AM Indications: diagnostic evaluation and pain Details: 22 G 1.5 in needle, superolateral approach  Arthrogram: No  Medications: 3 mL lidocaine 1 %; 40 mg methylPREDNISolone acetate 40 MG/ML Outcome: tolerated well, no immediate complications Procedure, treatment alternatives, risks and benefits explained, specific risks discussed. Consent was given by the patient. Immediately prior to procedure a time out was called to verify the correct patient, procedure, equipment, support staff and site/side marked as required. Patient was prepped and draped in the usual sterile fashion.       Clinical Data: No additional findings.   Subjective: Chief Complaint  Patient presents with  . Right Knee - Pain  . Left Knee - Pain  The patient is actually well-known to me.  She is listed as a new patient but I have not seen her in a very long time.  She has a history of bilateral partial knee arthroplasties that were done in 2014 at [facility].  These were of the medial compartment of both knees.  She has let me know that she has been going to [facility] for steroid injections in both knees about every 3 to 4 months.  She would rather have this done in [place] because of where she lives now.  She is someone who is morbidly obese with a BMI of almost 43.  She has global tenderness in both knees.  She does ambulate using a cane.  She is requesting steroid injections in both knees today.  I remember back when I first saw her that I recommended bilateral total knee arthroplasties  as opposed to partial knee arthroplasties.  HPI  Review of Systems She currently denies any headache, chest pain, shortness of breath, fever, chills, nausea, vomiting  Objective: Vital Signs: Ht 5\' 3"  (1.6 m)   Wt 242 lb 6.4 oz (110 kg)   BMI 42.94 kg/m   Physical Exam She is alert and orient x3 and in no acute distress Ortho Exam Examination of both knees show obese knees.  She has well-healed medial surgical incisions just off the midline.  There is global tenderness with both knees.  Both knees are ligamentously stable but have slight malalignment.  There is patellofemoral crepitation with both knees. Specialty Comments:  No specialty comments available.  Imaging: XR Knee 1-2 Views Left  Result Date: 03/26/2020 2 views the left knee show a total knee arthroplasty with some evidence of failure.  There is also tricompartmental arthritis throughout the knee except for that compartment but has been replaced.  XR Knee 1-2 Views Right  Result Date: 03/26/2020 2 views of the right knee show a medial compartment partial knee arthroplasty with some evidence of failure.  There is significant particular osteophytes in the lateral and patellofemoral joint.    PMFS History: Patient Active Problem List   Diagnosis Date Noted  . Pneumonia due to COVID-19 virus 02/26/2019  . Respiratory failure (HCC)/ hypoxic 02/26/2019  . Essential hypertension 02/26/2019  . Dyslipidemia 02/26/2019  . GERD (gastroesophageal reflux disease) 02/26/2019  . Bunion 08/21/2014  . 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 Onychomycosis 02/28/2013  . Pain, foot 02/28/2013   Past Medical History:  Diagnosis Date  . Hypertension     History reviewed. No pertinent family history.  Past Surgical History:  Procedure Laterality Date  . 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 Not on file  Tobacco Use  . Smoking status: Never Smoker  . Smokeless  tobacco: Never Used  Substance and Sexual Activity  . Alcohol use: No    Alcohol/week: 0.0 standard drinks  . Drug use: No  . Sexual activity: Never    Birth control/protection: Surgical    Comment: hysterectomy

## 2020-06-23 ENCOUNTER — Other Ambulatory Visit: Payer: Self-pay

## 2020-06-23 ENCOUNTER — Encounter: Payer: Self-pay | Admitting: Orthopaedic Surgery

## 2020-06-23 ENCOUNTER — Ambulatory Visit (INDEPENDENT_AMBULATORY_CARE_PROVIDER_SITE_OTHER): Payer: POS | Admitting: Orthopaedic Surgery

## 2020-06-23 DIAGNOSIS — Z96653 Presence of artificial knee joint, bilateral: Secondary | ICD-10-CM | POA: Diagnosis not present

## 2020-06-23 DIAGNOSIS — M170 Bilateral primary osteoarthritis of knee: Secondary | ICD-10-CM

## 2020-06-23 DIAGNOSIS — M1711 Unilateral primary osteoarthritis, right knee: Secondary | ICD-10-CM

## 2020-06-23 DIAGNOSIS — Z96659 Presence of unspecified artificial knee joint: Secondary | ICD-10-CM

## 2020-06-23 DIAGNOSIS — M1712 Unilateral primary osteoarthritis, left knee: Secondary | ICD-10-CM | POA: Diagnosis not present

## 2020-06-23 NOTE — Progress Notes (Signed)
The patient has a history of bilateral partial knee arthroplasties done elsewhere.  She has tricompartmental arthritis in those knees and is obese and a diabetic.  She does have chronic pain on both knees.  I did place a steroid injection in both knees in November of this past year which is just right at 3 months.  She has done well with those injections.  She understands that we would not recommend injections today given the fact that she does have these partial knee replacements in any steroid injections close together could continue to have adverse effects on the remainder of her knee and her blood glucose as well as the partial arthroplasties and cells.  She has had no other acute change in medical status.  Both knees seem to move well but have global pain and tenderness with patellofemoral crepitation.  There is no effusion with either knee today.  We did not need to x-ray the knees today having just x-rayed them 3 months ago.  My recommendation would still be considering revision arthroplasties on both knees.  We can certainly consider 1 more steroid injection wait at least 2 more months.  She understands this as well.

## 2020-06-26 ENCOUNTER — Telehealth: Payer: Self-pay | Admitting: Orthopaedic Surgery

## 2020-06-26 MED ORDER — METHYLPREDNISOLONE 4 MG PO TABS: ORAL_TABLET | ORAL | 0 refills | Status: DC

## 2020-06-26 NOTE — Telephone Encounter (Signed)
Patient called requesting a call back. Patient did not disclose reasoning of call back. Please call patient at [phone].

## 2020-06-26 NOTE — Telephone Encounter (Signed)
Patient wondering if you would prescribe her prednisone dose pak since we can't give her injections in her knees  [facility]

## 2020-06-26 NOTE — Telephone Encounter (Signed)
I sent in a Dosepak.

## 2020-06-26 NOTE — Telephone Encounter (Signed)
Pt informed

## 2020-07-23 ENCOUNTER — Ambulatory Visit: Payer: POS | Admitting: Orthopaedic Surgery

## 2020-08-29 ENCOUNTER — Other Ambulatory Visit: Payer: Self-pay

## 2020-08-29 ENCOUNTER — Ambulatory Visit
Admission: EM | Admit: 2020-08-29 | Discharge: 2020-08-29 | Disposition: A | Discharge status: Home / Self Care | Payer: POS | Attending: Family Medicine | Admitting: Family Medicine

## 2020-08-29 DIAGNOSIS — J0140 Acute pansinusitis, unspecified: Secondary | ICD-10-CM | POA: Diagnosis not present

## 2020-08-29 DIAGNOSIS — R062 Wheezing: Secondary | ICD-10-CM

## 2020-08-29 DIAGNOSIS — J22 Unspecified acute lower respiratory infection: Secondary | ICD-10-CM

## 2020-08-29 MED ORDER — PREDNISONE 10 MG PO TABS: 20.0000 mg | ORAL_TABLET | Freq: Every day | ORAL | 0 refills | Status: AC

## 2020-08-29 MED ORDER — AMOXICILLIN-POT CLAVULANATE 875-125 MG PO TABS: 1.0000 | ORAL_TABLET | Freq: Two times a day (BID) | ORAL | 0 refills | Status: AC

## 2020-08-29 MED ORDER — BENZONATATE 100 MG PO CAPS: 100.0000 mg | ORAL_CAPSULE | Freq: Three times a day (TID) | ORAL | 0 refills | Status: DC

## 2020-08-29 NOTE — Discharge Instructions (Addendum)
I have sent in Augmentin for you to take twice a day for 7 days.  I have sent in prednisone for you to take 2 tablets for 5 days  I have sent in tessalon perles for you to use one capsule every 8 hours as needed for cough.  Follow up with this office or with primary care if symptoms are persisting.  Follow up in the ER for high fever, trouble swallowing, trouble breathing, other concerning symptoms.

## 2020-08-29 NOTE — ED Provider Notes (Signed)
[facility]   [id number] 08/29/20 Arrival Time: 1046   CC: COVID symptoms  SUBJECTIVE: History from: patient.  [name] is a 75 y.o. female who presents with cough, sinus pain, wheezing, chest tightness for the last 7 days. Denies sick exposure to COVID, flu or strep. Denies recent travel. Has taken OTC cough and cold medications for this. Symptoms are aggravated by activity. Denies previous symptoms in the past. Denies fever, chills, sinus pain, rhinorrhea, chest pain, nausea, changes in bowel or bladder habits.    ROS: As per HPI.  All other pertinent ROS negative.     Past Medical History:  Diagnosis Date  . Hypertension    Past Surgical History:  Procedure Laterality Date  . MEDIAL PARTIAL KNEE REPLACEMENT Bilateral 06/07/12   Allergies  Allergen Reactions  . Shellfish-Derived Products Other (See Comments)    Shrimp and lobster--severe swelling/itching  . Codeine Nausea And Vomiting  . Tetracyclines & Related Nausea And Vomiting  . Vibramycin [Doxycycline Calcium] Nausea And Vomiting   No current facility-administered medications on file prior to encounter.   Current Outpatient Medications on File Prior to Encounter  Medication Sig Dispense Refill  . aspirin EC 81 MG tablet Take 81 mg by mouth daily.    . Diethylpropion HCl CR 75 MG TB24 Take 75 mg by mouth daily. Reported on 06/03/2015  0  . hydrochlorothiazide (HYDRODIURIL) 12.5 MG tablet Take 12.5 mg by mouth daily.     Marland Kitchen losartan (COZAAR) 50 MG tablet Take 50 mg by mouth daily.    . methylPREDNISolone (MEDROL) 4 MG tablet Medrol dose pack. Take as instructed 21 tablet 0  . omeprazole (PRILOSEC) 40 MG capsule Take 40 mg by mouth daily.   0  . rosuvastatin (CRESTOR) 5 MG tablet Take 5 mg by mouth daily.     Social History   Socioeconomic History  . Marital status: Widowed    Spouse name: Not on file  . Number of children: Not on file  . Years of education: Not on file  . Highest education level:  Not on file  Occupational History  . Not on file  Tobacco Use  . Smoking status: Never Smoker  . Smokeless tobacco: Never Used  Substance and Sexual Activity  . Alcohol use: No    Alcohol/week: 0.0 standard drinks  . Drug use: No  . Sexual activity: Never    Birth control/protection: Surgical    Comment: hysterectomy  Other Topics Concern  . Not on file  Social History Narrative  . Not on file   Social Determinants of Health   Financial Resource Strain: Not on file  Food Insecurity: Not on file  Transportation Needs: Not on file  Physical Activity: Not on file  Stress: Not on file  Social Connections: Not on file  Intimate Partner Violence: Not on file   History reviewed. No pertinent family history.  OBJECTIVE:  Vitals:   08/29/20 1147  BP: 134/71  Pulse: 93  Resp: 18  Temp: 98.6 F (37 C)  TempSrc: Oral  SpO2: 95%     General appearance: alert; appears fatigued, but nontoxic; speaking in full sentences and tolerating own secretions HEENT: NCAT; Ears: EACs clear, TMs pearly gray; Eyes: PERRL.  EOM grossly intact. Sinuses: frontal and maxillary sinuses tender; Nose: nares patent with clear rhinorrhea, Throat: oropharynx erythematous, cobblestoning present, tonsils non erythematous or enlarged, uvula midline  Neck: supple with LAD Lungs: unlabored respirations, symmetrical air entry; cough: moderate; no respiratory distress; mild wheezing  to bilateral lower lobes Heart: regular rate and rhythm.  Radial pulses 2+ symmetrical bilaterally Skin: warm and dry Psychological: alert and cooperative; normal mood and affect  LABS:  No results found for this or any previous visit (from the past 24 hour(s)).   ASSESSMENT & PLAN:  1. Lower respiratory infection   2. Wheezing   3. Acute non-recurrent pansinusitis     Meds ordered this encounter  Medications  . amoxicillin-clavulanate (AUGMENTIN) 875-125 MG tablet    Sig: Take 1 tablet by mouth 2 (two) times daily for  7 days.    Dispense:  14 tablet    Refill:  0    Order Specific Question:   Supervising Provider    Answer:   Merrilee Jansky [id number]  . predniSONE (DELTASONE) 10 MG tablet    Sig: Take 2 tablets (20 mg total) by mouth daily for 5 days.    Dispense:  10 tablet    Refill:  0    Order Specific Question:   Supervising Provider    Answer:   Merrilee Jansky [id number]  . benzonatate (TESSALON) 100 MG capsule    Sig: Take 1 capsule (100 mg total) by mouth every 8 (eight) hours.    Dispense:  21 capsule    Refill:  0    Order Specific Question:   Supervising Provider    Answer:   Merrilee Jansky [[id number]]    Prescribed Augmentin 875 BID x 7 days Prescribed prednisone Prescribed tessalon perles Continue supportive care at home Get plenty of rest and push fluids Use OTC zyrtec for nasal congestion, runny nose, and/or sore throat Use OTC flonase for nasal congestion and runny nose Use medications daily for symptom relief Use OTC medications like ibuprofen or tylenol as needed fever or pain Call or go to the ED if you have any new or worsening symptoms such as fever, worsening cough, shortness of breath, chest tightness, chest pain, turning blue, changes in mental status.  Reviewed expectations re: course of current medical issues. Questions answered. Outlined signs and symptoms indicating need for more acute intervention. Patient verbalized understanding. After Visit Summary given.         Moshe Cipro, NP 08/29/20 1218

## 2020-08-29 NOTE — ED Triage Notes (Addendum)
Pt present coughing,fatigue and sore throat with congestion and left ear pain. Symptoms started last Sunday. Pt tried otc medication with no relief.

## 2020-09-15 IMAGING — CT CT ANGIO CHEST
3 of 7 series · 19 of 36 positions shown · IV contrast (OMNIPAQUE)
Comparison: February 25, 2019.

CLINICAL DATA: Elevated D-dimer level.

EXAM:
CT ANGIOGRAPHY CHEST WITH CONTRAST
TECHNIQUE: Multidetector CT imaging of the chest was performed using the
standard protocol during bolus administration of intravenous
contrast. Multiplanar CT image reconstructions and MIPs were
obtained to evaluate the vascular anatomy.
CONTRAST:  100mL OMNIPAQUE IOHEXOL 350 MG/ML SOLN

[Series 6: pe chest · axial · 0.78mm/px · z∈[+933,+1187]mm · 13 of 149 slices shown]
[im 11/149  lung]
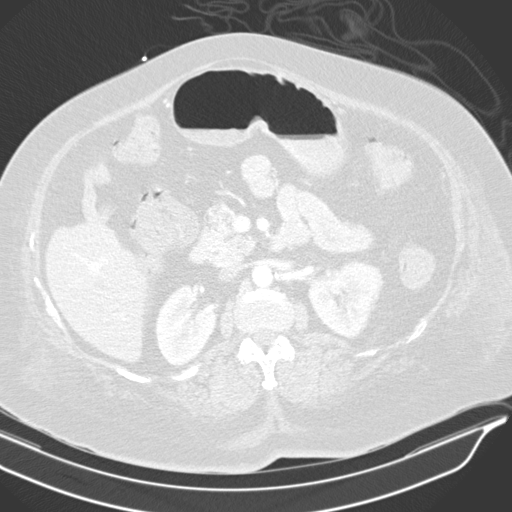
[im 22/149  mediastinal]
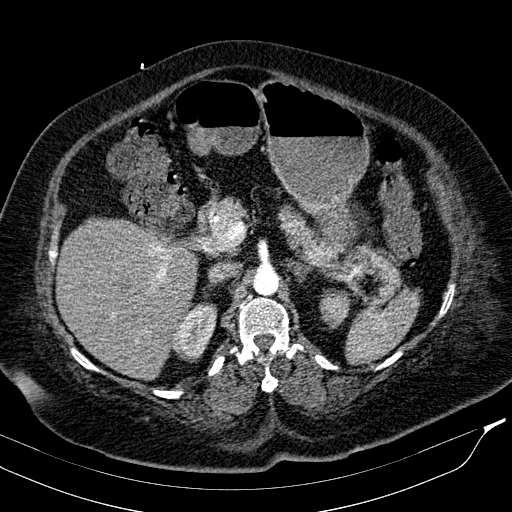
[im 32/149  lung]
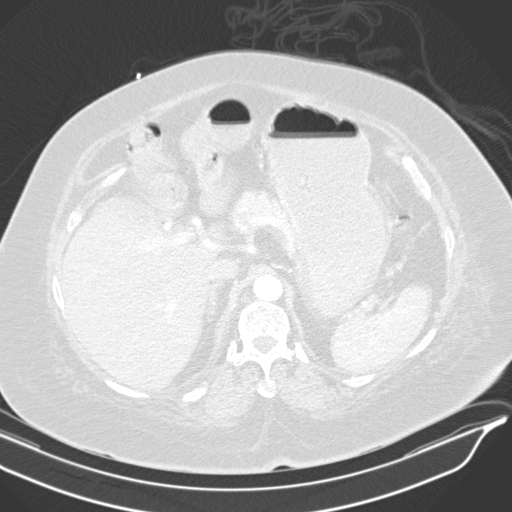
[im 43/149  mediastinal]
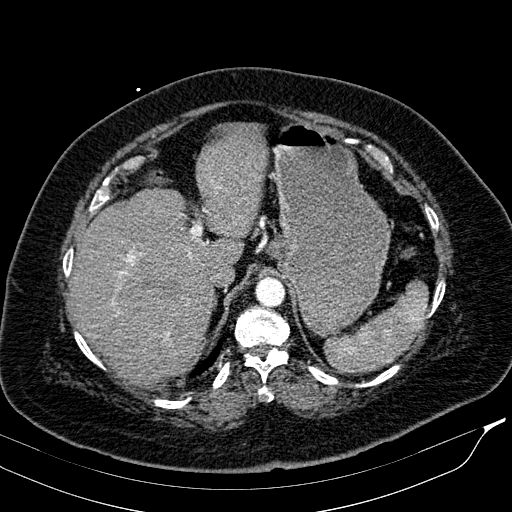
[im 53/149  lung]
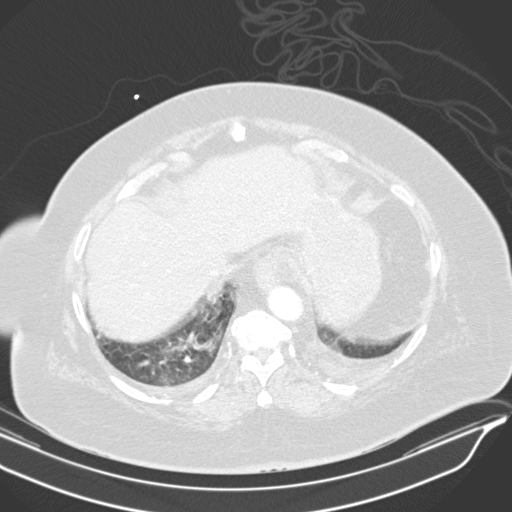
[im 64/149  mediastinal]
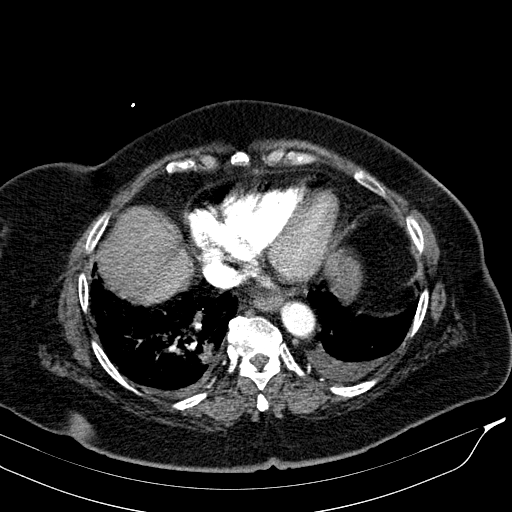
[im 75/149  lung]
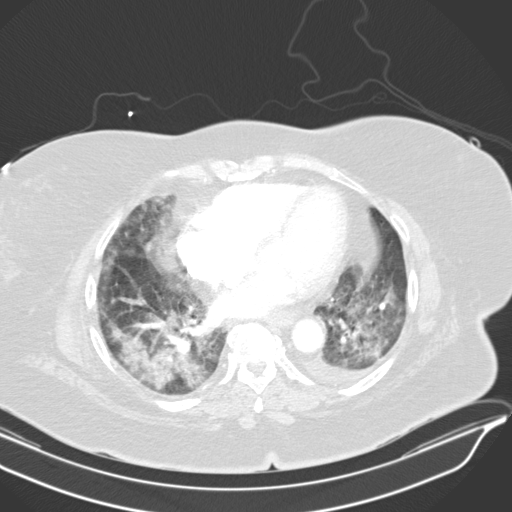
[im 85/149  mediastinal]
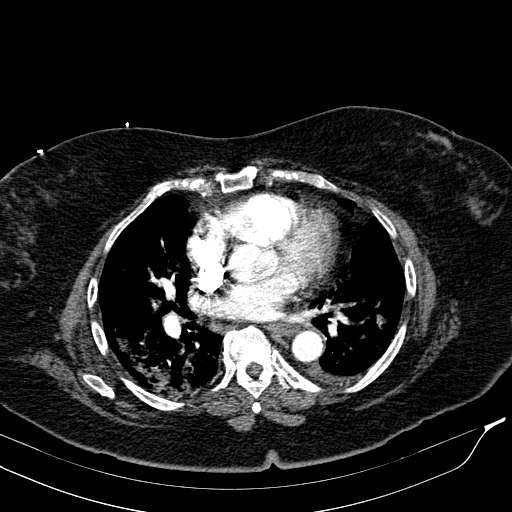
[im 96/149  lung]
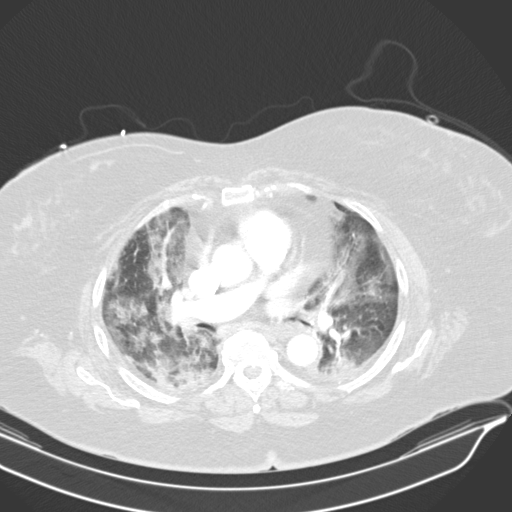
[im 106/149  mediastinal]
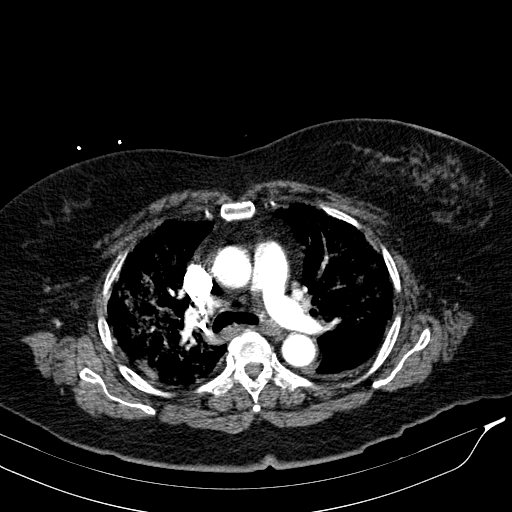
[im 117/149  lung]
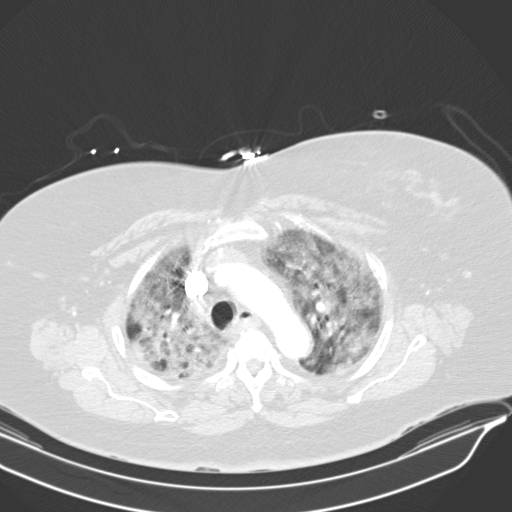
[im 127/149  mediastinal]
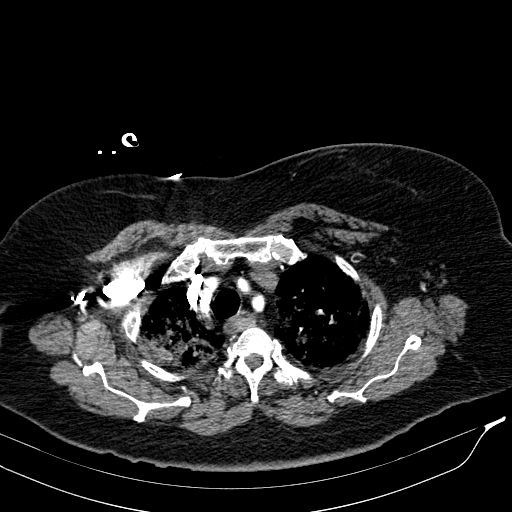
[im 138/149  lung]
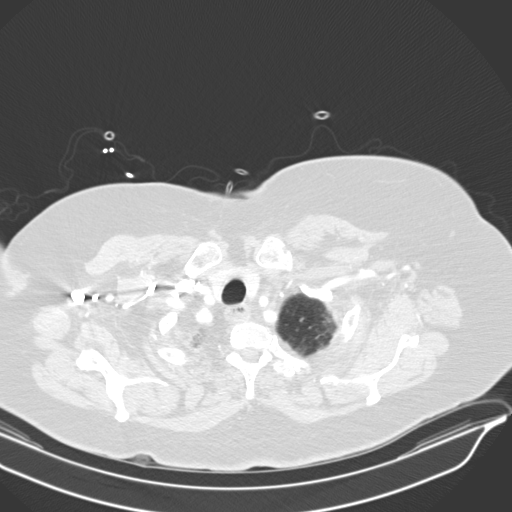

[Series 7: lung · axial · 0.74mm/px · z∈[+1001,+1105]mm · 5 of 115 slices shown]
[im 11/115  mediastinal]
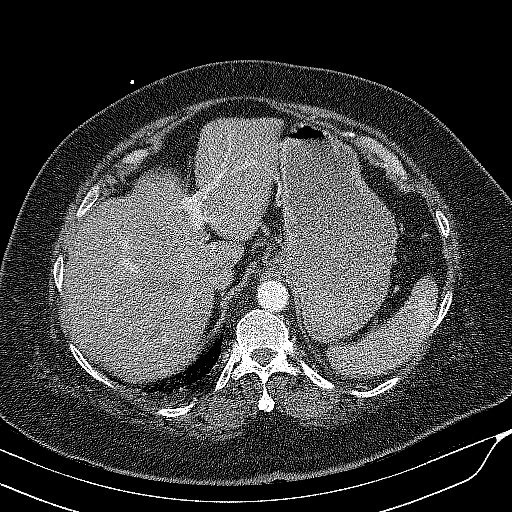
[im 21/115  mediastinal]
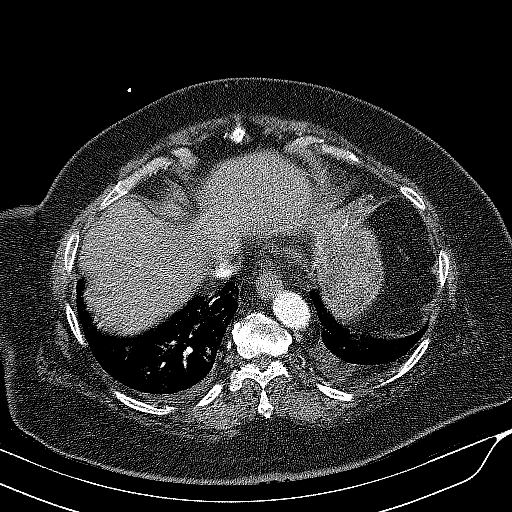
[im 42/115  mediastinal]
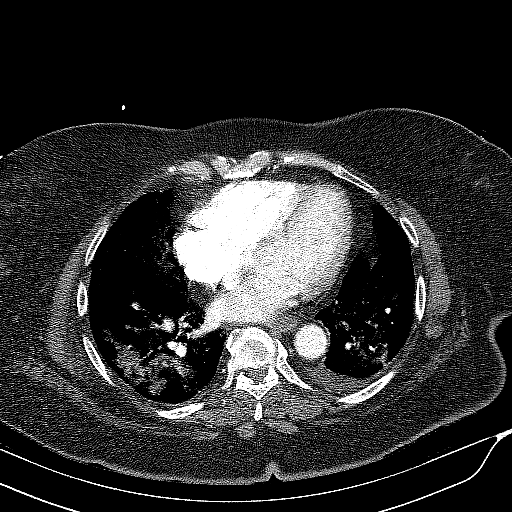
[im 52/115  mediastinal]
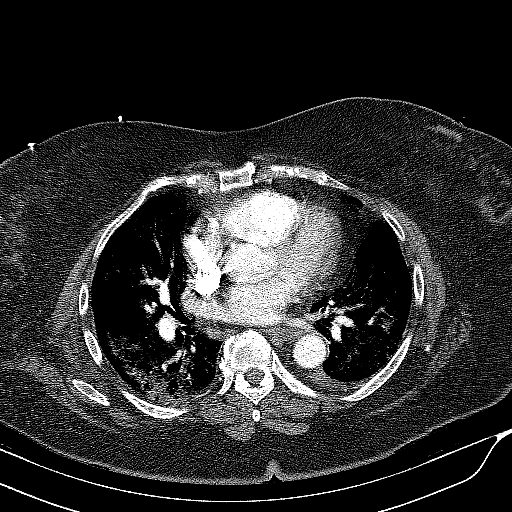
[im 63/115  mediastinal]
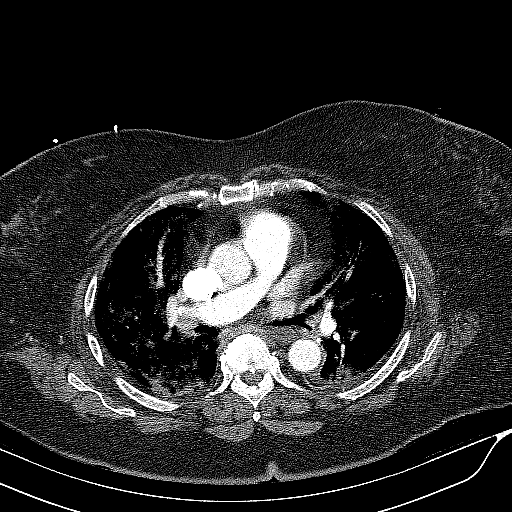

[Series 604: cor mpr · coronal · 0.70mm/px · 1 of 111 slices shown]
[im 56/111  mediastinal]
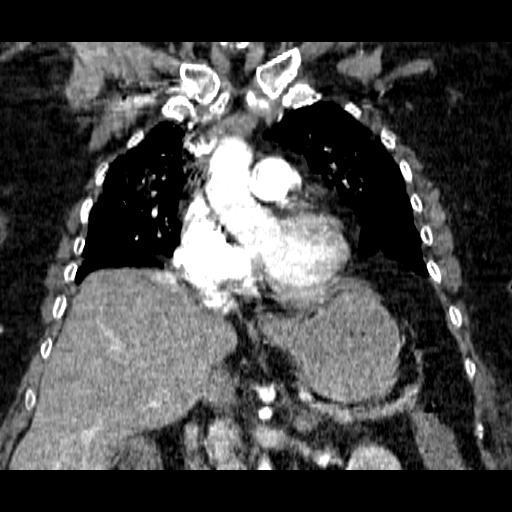

[19 of 36 positions shown; findings below may reference images not displayed]

FINDINGS: Cardiovascular: Satisfactory opacification of the pulmonary arteries
to the segmental level. No evidence of pulmonary embolism. Normal
heart size. No pericardial effusion. Atherosclerosis of thoracic
aorta is noted without aneurysm or dissection.

Mediastinum/Nodes: No enlarged mediastinal, hilar, or axillary lymph
nodes. Thyroid gland, trachea, and esophagus demonstrate no
significant findings.

Lungs/Pleura: No pneumothorax or pleural effusion is noted. Multiple
airspace opacities are noted diffusely throughout both lungs most
consistent with multifocal pneumonia.

Upper Abdomen: No acute abnormality.

Musculoskeletal: No chest wall abnormality. No acute or significant
osseous findings.

Review of the MIP images confirms the above findings.
IMPRESSION: No definite evidence of pulmonary embolus.

Multiple airspace opacities are noted bilaterally consistent with
multifocal pneumonia.

Aortic Atherosclerosis ([id number]).

## 2020-10-07 ENCOUNTER — Encounter: Payer: Self-pay | Admitting: Orthopaedic Surgery

## 2020-10-07 ENCOUNTER — Ambulatory Visit (INDEPENDENT_AMBULATORY_CARE_PROVIDER_SITE_OTHER): Payer: POS | Admitting: Orthopaedic Surgery

## 2020-10-07 DIAGNOSIS — Z96659 Presence of unspecified artificial knee joint: Secondary | ICD-10-CM

## 2020-10-07 DIAGNOSIS — M25561 Pain in right knee: Secondary | ICD-10-CM | POA: Diagnosis not present

## 2020-10-07 DIAGNOSIS — M1711 Unilateral primary osteoarthritis, right knee: Secondary | ICD-10-CM

## 2020-10-07 DIAGNOSIS — M1712 Unilateral primary osteoarthritis, left knee: Secondary | ICD-10-CM

## 2020-10-07 DIAGNOSIS — M25562 Pain in left knee: Secondary | ICD-10-CM

## 2020-10-07 DIAGNOSIS — M170 Bilateral primary osteoarthritis of knee: Secondary | ICD-10-CM | POA: Diagnosis not present

## 2020-10-07 NOTE — Progress Notes (Signed)
Office Visit Note   Patient: [name]           Date of Birth: [birth year]           MRN: [id number] Visit Date: 10/07/2020              Requested by: Corine Shelter, MD [address] [place],  [place] [address] PCP: Corine Shelter, MD   Assessment & Plan: Visit Diagnoses:  1. Unilateral primary osteoarthritis, left knee   2. Unilateral primary osteoarthritis, right knee   3. History of partial knee replacement   4. Left knee pain, unspecified chronicity   5. Right knee pain, unspecified chronicity     Plan: I did place a steroid injection in both knees today.  I would wait at least 6 months between injections.  Again, I am concerned about her short-term memory loss I have advocated her seeing her primary care physician soon as possible and talking to her son.  All questions and concerns were answered and addressed.  Follow-Up Instructions: Return if symptoms worsen or fail to improve.   Orders:  No orders of the defined types were placed in this encounter.  No orders of the defined types were placed in this encounter.     Procedures: No procedures performed   Clinical Data: No additional findings.   Subjective: Chief Complaint  Patient presents with  . Right Knee - Pain  . Left Knee - Pain  The patient is well-known to me.  She has a history of bilateral knee partial knee replacements that were done in [place].  They have been slowly failing.  She does have tricompartment arthritis and is significantly obese.  I have recommended knee replacements from the get go.  She is requesting steroid injections in both knees today.  I did do this once 6 months ago.  I told her I would certainly do this 1 more time but there is a heightened risk of continued implant failure and infection.  What is more concerning today as she is talked about increasing short-term memory issues and even getting lost coming to our office today.  She has not addressed this with  her side and her primary care physician coming up.  She is not a diabetic.  She denies any other acute change in her medical status.  HPI  Review of Systems Today she denies any headache, chest pain, shortness of breath, fever, chills, nausea, vomiting  Objective: Vital Signs: There were no vitals taken for this visit.  Physical Exam She is alert and orient x3 and in no acute distress Ortho Exam Examination of both knees shows well-healed surgical incision medially from her unicompartmental knee replacements.  There is no effusion or redness but global tenderness and patellofemoral crepitation. Specialty Comments:  No specialty comments available.  Imaging: No results found.   PMFS History: Patient Active Problem List   Diagnosis Date Noted  . Pneumonia due to COVID-19 virus 02/26/2019  . Respiratory failure (HCC)/ hypoxic 02/26/2019  . Essential hypertension 02/26/2019  . Dyslipidemia 02/26/2019  . GERD (gastroesophageal reflux disease) 02/26/2019  . Bunion 08/21/2014  . 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 Onychomycosis 02/28/2013  . Pain, foot 02/28/2013   Past Medical History:  Diagnosis Date  . Hypertension     History reviewed. No pertinent family history.  Past Surgical History:  Procedure Laterality Date  . 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 Not on  file  Tobacco Use  . Smoking status: Never Smoker  . Smokeless tobacco: Never Used  Substance and Sexual Activity  . Alcohol use: No    Alcohol/week: 0.0 standard drinks  . Drug use: No  . Sexual activity: Never    Birth control/protection: Surgical    Comment: hysterectomy

## 2021-01-02 ENCOUNTER — Encounter: Payer: Self-pay | Admitting: Emergency Medicine

## 2021-01-02 ENCOUNTER — Ambulatory Visit
Admission: EM | Admit: 2021-01-02 | Discharge: 2021-01-02 | Disposition: A | Discharge status: Home / Self Care | Payer: POS | Attending: Urgent Care | Admitting: Urgent Care

## 2021-01-02 ENCOUNTER — Other Ambulatory Visit: Payer: Self-pay

## 2021-01-02 DIAGNOSIS — N3001 Acute cystitis with hematuria: Secondary | ICD-10-CM

## 2021-01-02 DIAGNOSIS — M545 Low back pain, unspecified: Secondary | ICD-10-CM

## 2021-01-02 DIAGNOSIS — R3915 Urgency of urination: Secondary | ICD-10-CM

## 2021-01-02 LAB — POCT URINALYSIS DIP (MANUAL ENTRY)
Bilirubin, UA: NEGATIVE
Glucose, UA: NEGATIVE mg/dL
Ketones, POC UA: NEGATIVE mg/dL
Nitrite, UA: NEGATIVE
Protein Ur, POC: 100 mg/dL — AB
Spec Grav, UA: 1.015 (ref 1.010–1.025)
Urobilinogen, UA: 2 E.U./dL — AB
pH, UA: 8.5 — AB (ref 5.0–8.0)

## 2021-01-02 MED ORDER — CEPHALEXIN 500 MG PO CAPS: 500.0000 mg | ORAL_CAPSULE | Freq: Two times a day (BID) | ORAL | 0 refills | Status: DC

## 2021-01-02 NOTE — Discharge Instructions (Addendum)

## 2021-01-02 NOTE — ED Triage Notes (Signed)
C/o lower left flank pain starting yesterday. Noticed slight pink tinged drainage in underwear yesterday. Reports frank blood in urine this morning. Denies hx of this. Denies any fever, nausea, vomiting, dizziness, or abdominal pain

## 2021-01-02 NOTE — ED Provider Notes (Signed)
[facility]   MRN: [id number] DOB: 1946/01/04  Subjective:   [name] is a 75 y.o. female presenting for 1 day history of acute onset urinary urgency, hematuria, left lower back pain.  Denies fever, nausea, vomiting, dysuria, flank pain, history of kidney stones.  Patient states that she has a history UTIs and this feels similar apart from not having dysuria.  Tries to hydrate very well every day, drinks water almost exclusively.  No current facility-administered medications for this encounter.  Current Outpatient Medications:    aspirin EC 81 MG tablet, Take 81 mg by mouth daily., Disp: , Rfl:    benzonatate (TESSALON) 100 MG capsule, Take 1 capsule (100 mg total) by mouth every 8 (eight) hours., Disp: 21 capsule, Rfl: 0   Diethylpropion HCl CR 75 MG TB24, Take 75 mg by mouth daily. Reported on 06/03/2015, Disp: , Rfl: 0   hydrochlorothiazide (HYDRODIURIL) 12.5 MG tablet, Take 12.5 mg by mouth daily. , Disp: , Rfl:    losartan (COZAAR) 50 MG tablet, Take 50 mg by mouth daily., Disp: , Rfl:    methylPREDNISolone (MEDROL) 4 MG tablet, Medrol dose pack. Take as instructed, Disp: 21 tablet, Rfl: 0   omeprazole (PRILOSEC) 40 MG capsule, Take 40 mg by mouth daily. , Disp: , Rfl: 0   rosuvastatin (CRESTOR) 5 MG tablet, Take 5 mg by mouth daily., Disp: , Rfl:    Allergies  Allergen Reactions   Shellfish-Derived Products Other (See Comments)    Shrimp and lobster--severe swelling/itching   Codeine Nausea And Vomiting   Tetracyclines & Related Nausea And Vomiting   Vibramycin [Doxycycline Calcium] Nausea And Vomiting    Past Medical History:  Diagnosis Date   Hypertension      Past Surgical History:  Procedure Laterality Date   MEDIAL PARTIAL KNEE REPLACEMENT Bilateral 06/07/12    History reviewed. No pertinent family history.  Social History   Tobacco Use   Smoking status: Never   Smokeless tobacco: Never  Substance Use Topics   Alcohol use: No     Alcohol/week: 0.0 standard drinks   Drug use: No    ROS   Objective:   Vitals: BP 139/80 (BP Location: Left Arm)   Pulse 92   Temp 98.1 F (36.7 C) (Oral)   Resp 16   SpO2 97%   Physical Exam Constitutional:      General: She is not in acute distress.    Appearance: Normal appearance. She is well-developed and normal weight. She is not ill-appearing, toxic-appearing or diaphoretic.  HENT:     Head: Normocephalic and atraumatic.     Right Ear: External ear normal.     Left Ear: External ear normal.     Nose: Nose normal.     Mouth/Throat:     Mouth: Mucous membranes are moist.     Pharynx: Oropharynx is clear.  Eyes:     General: No scleral icterus.    Extraocular Movements: Extraocular movements intact.     Pupils: Pupils are equal, round, and reactive to light.  Cardiovascular:     Rate and Rhythm: Normal rate and regular rhythm.     Heart sounds: Normal heart sounds. No murmur heard.   No friction rub. No gallop.  Pulmonary:     Effort: Pulmonary effort is normal. No respiratory distress.     Breath sounds: Normal breath sounds. No stridor. No wheezing, rhonchi or rales.  Abdominal:     General: Bowel sounds are normal. There is no  distension.     Palpations: Abdomen is soft. There is no mass.     Tenderness: There is no abdominal tenderness. There is no right CVA tenderness, left CVA tenderness, guarding or rebound.  Skin:    General: Skin is warm and dry.     Coloration: Skin is not pale.     Findings: No rash.  Neurological:     General: No focal deficit present.     Mental Status: She is alert and oriented to person, place, and time.  Psychiatric:        Mood and Affect: Mood normal.        Behavior: Behavior normal.        Thought Content: Thought content normal.        Judgment: Judgment normal.    Results for orders placed or performed during the hospital encounter of 01/02/21 (from the past 24 hour(s))  POCT urinalysis dipstick     Status: Abnormal    Collection Time: 01/02/21 12:25 PM  Result Value Ref Range   Color, UA yellow yellow   Clarity, UA cloudy (A) clear   Glucose, UA negative negative mg/dL   Bilirubin, UA negative negative   Ketones, POC UA negative negative mg/dL   Spec Grav, UA 5.631 4.970 - 1.025   Blood, UA large (A) negative   pH, UA 8.5 (A) 5.0 - 8.0   Protein Ur, POC =100 (A) negative mg/dL   Urobilinogen, UA 2.0 (A) 0.2 or 1.0 E.U./dL   Nitrite, UA Negative Negative   Leukocytes, UA Small (1+) (A) Negative    Assessment and Plan :   PDMP not reviewed this encounter.  1. Acute cystitis with hematuria   2. Urinary urgency   3. Acute left-sided low back pain without sciatica     Start Keflex to cover for acute cystitis, urine culture pending.  Recommended aggressive hydration, limiting urinary irritants. Counseled patient on potential for adverse effects with medications prescribed/recommended today, ER and return-to-clinic precautions discussed, patient verbalized understanding.    Wallis Bamberg, PA-C 01/02/21 1235

## 2021-01-08 ENCOUNTER — Other Ambulatory Visit: Payer: Self-pay

## 2021-01-08 ENCOUNTER — Ambulatory Visit
Admission: EM | Admit: 2021-01-08 | Discharge: 2021-01-08 | Disposition: A | Discharge status: Home / Self Care | Payer: POS | Attending: Family Medicine | Admitting: Family Medicine

## 2021-01-08 ENCOUNTER — Other Ambulatory Visit: Payer: Self-pay | Admitting: Family Medicine

## 2021-01-08 DIAGNOSIS — R062 Wheezing: Secondary | ICD-10-CM | POA: Diagnosis not present

## 2021-01-08 DIAGNOSIS — J22 Unspecified acute lower respiratory infection: Secondary | ICD-10-CM | POA: Diagnosis not present

## 2021-01-08 DIAGNOSIS — R0602 Shortness of breath: Secondary | ICD-10-CM

## 2021-01-08 MED ORDER — DEXAMETHASONE SODIUM PHOSPHATE 10 MG/ML IJ SOLN
10.0000 mg | Freq: Once | INTRAMUSCULAR | Status: AC
  Administered 2021-01-08: 10 mg via INTRAMUSCULAR

## 2021-01-08 MED ORDER — ALBUTEROL SULFATE HFA 108 (90 BASE) MCG/ACT IN AERS: 1.0000 | INHALATION_SPRAY | Freq: Four times a day (QID) | RESPIRATORY_TRACT | 0 refills | Status: DC | PRN

## 2021-01-08 MED ORDER — AZITHROMYCIN 250 MG PO TABS: ORAL_TABLET | ORAL | 0 refills | Status: DC

## 2021-01-08 NOTE — ED Triage Notes (Addendum)
Pt states seen here last Friday for same and her wheezing and SOB is worse. States given Cephalexin and told if no better to come back. States her voice is back but still feels weak. Pt denies being here 8/19 for UTI.

## 2021-01-08 NOTE — ED Provider Notes (Signed)
[facility]    CSN: [id number] Arrival date & time: 01/08/21  1146      History   Chief Complaint Chief Complaint  Patient presents with   Shortness of Breath    HPI [name] is a 75 y.o. female.   Patient presenting today with over a week of worsening productive cough, wheezing, chest tightness, shortness of breath, hoarseness. Denies CP, abdominal pain, N/V/D, fever, chills. Was seen almost a week ago and given keflex for UTI, completed that with no relief. Otherwise not taking anything OTC for sxs. Hx of COVID pneumonia.    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      Home Medications    Prior to Admission medications   Medication Sig Start Date End Date Taking? Authorizing Provider  albuterol (VENTOLIN HFA) 108 (90 Base) MCG/ACT inhaler Inhale 1-2 puffs into the lungs every 6 (six) hours as needed for wheezing or shortness of breath. 01/08/21  Yes Particia Nearing, PA-C  azithromycin (ZITHROMAX Z-PAK) 250 MG tablet Take 2 tabs day one, then 1 tab daily until complete 01/08/21  Yes Particia Nearing, PA-C  aspirin EC 81 MG tablet Take 81 mg by mouth daily.    [provider]  cephALEXin (KEFLEX) 500 MG capsule Take 1 capsule (500 mg total) by mouth 2 (two) times daily. 01/02/21   Wallis Bamberg, PA-C  Diethylpropion HCl CR 75 MG TB24 Take 75 mg by mouth daily. Reported on 06/03/2015 07/17/14    [provider]  hydrochlorothiazide (HYDRODIURIL) 12.5 MG tablet Take 12.5 mg by mouth daily.  02/06/19   [provider]  losartan (COZAAR) 50 MG tablet Take 50 mg by mouth daily. 02/06/19   [provider]  omeprazole (PRILOSEC) 40 MG capsule Take 40 mg by mouth daily.  08/01/14   [provider]  rosuvastatin (CRESTOR) 5 MG tablet Take 5 mg by mouth daily.    [provider]    Family History History reviewed. No pertinent family history.  Social History Social History   Tobacco Use   Smoking status: Never   Smokeless tobacco: Never  Substance Use Topics   Alcohol use: No    Alcohol/week: 0.0 standard drinks   Drug use: No     Allergies   Shellfish-derived products, Codeine, Tetracyclines & related, and Vibramycin [doxycycline calcium]   Review of Systems Review of Systems PER HPI   Physical Exam Triage Vital Signs ED Triage Vitals [01/08/21 1202]  Enc Vitals Group     BP 105/61     Pulse Rate 95     Resp 18     Temp 99 F (37.2 C)     Temp Source Oral     SpO2 92 %     Weight      Height      Head Circumference      Peak Flow      Pain Score 0  Pain Loc      Pain Edu?      Excl. in GC?    No data found.  Updated Vital Signs BP 105/61 (BP Location: Left Arm)   Pulse 95   Temp 99 F (37.2 C) (Oral)   Resp 18   SpO2 92%   Visual Acuity Right Eye Distance:   Left Eye Distance:   Bilateral Distance:    Right Eye Near:   Left Eye Near:    Bilateral Near:     Physical Exam Vitals and nursing note reviewed.  Constitutional:      Appearance: Normal appearance. She is not ill-appearing.  HENT:     Head: Atraumatic.     Right Ear: Tympanic membrane normal.     Left Ear: Tympanic membrane normal.     Nose: Congestion present.     Mouth/Throat:     Mouth: Mucous membranes are moist.     Pharynx: Oropharynx is clear. Posterior oropharyngeal erythema present.  Eyes:     Extraocular Movements:  Extraocular movements intact.     Conjunctiva/sclera: Conjunctivae normal.  Cardiovascular:     Rate and Rhythm: Normal rate and regular rhythm.     Heart sounds: Normal heart sounds.  Pulmonary:     Effort: Pulmonary effort is normal.     Breath sounds: Wheezing present. No rales.     Comments: Wheezes b/l bases Abdominal:     General: Bowel sounds are normal. There is no distension.     Palpations: Abdomen is soft.     Tenderness: There is no abdominal tenderness. There is no right CVA tenderness, left CVA tenderness or guarding.  Musculoskeletal:        General: Normal range of motion.     Cervical back: Normal range of motion and neck supple.  Skin:    General: Skin is warm and dry.  Neurological:     Mental Status: She is alert and oriented to person, place, and time.     Motor: No weakness.     Gait: Gait normal.  Psychiatric:        Mood and Affect: Mood normal.        Thought Content: Thought content normal.        Judgment: Judgment normal.     UC Treatments / Results  Labs (all labs ordered are listed, but only abnormal results are displayed) Labs Reviewed - No data to display  EKG   Radiology No results found.  Procedures Procedures (including critical care time)  Medications Ordered in UC Medications  dexamethasone (DECADRON) injection 10 mg (10 mg Intramuscular Given 01/08/21 1221)    Initial Impression / Assessment and Plan / UC Course  I have reviewed the triage vital signs and the nursing notes.  Pertinent labs & imaging results that were available during my care of the patient were reviewed by me and considered in my medical decision making (see chart for details).     O2 saturation dropped from 97% to 92% since recent visit last week and sxs worsening so will cover for developing pneumonia with azithromycin, IM decadron, mucinex, albuterol inhaler prn. Return for acutely worsening sxs.   Final Clinical Impressions(s) / UC Diagnoses   Final  diagnoses:  Lower respiratory infection  Wheezing  SOB (shortness of breath)   Discharge Instructions   None    ED Prescriptions     Medication Sig Dispense Auth. Provider   albuterol (VENTOLIN HFA) 108 (90 Base) MCG/ACT inhaler Inhale 1-2 puffs  into the lungs every 6 (six) hours as needed for wheezing or shortness of breath. 18 g Particia Nearing, PA-C   azithromycin (ZITHROMAX Z-PAK) 250 MG tablet Take 2 tabs day one, then 1 tab daily until complete 6 tablet Particia Nearing, [place]      PDMP not reviewed this encounter.   Particia Nearing, [place] 01/08/21 1233

## 2021-01-12 ENCOUNTER — Ambulatory Visit: Payer: POS | Admitting: Orthopaedic Surgery

## 2021-01-26 ENCOUNTER — Other Ambulatory Visit: Payer: Self-pay

## 2021-01-26 ENCOUNTER — Ambulatory Visit (INDEPENDENT_AMBULATORY_CARE_PROVIDER_SITE_OTHER): Payer: POS | Admitting: Orthopaedic Surgery

## 2021-01-26 ENCOUNTER — Encounter: Payer: Self-pay | Admitting: Orthopaedic Surgery

## 2021-01-26 DIAGNOSIS — M1712 Unilateral primary osteoarthritis, left knee: Secondary | ICD-10-CM

## 2021-01-26 DIAGNOSIS — M25562 Pain in left knee: Secondary | ICD-10-CM

## 2021-01-26 DIAGNOSIS — M25561 Pain in right knee: Secondary | ICD-10-CM | POA: Diagnosis not present

## 2021-01-26 DIAGNOSIS — M1711 Unilateral primary osteoarthritis, right knee: Secondary | ICD-10-CM | POA: Diagnosis not present

## 2021-01-26 DIAGNOSIS — Z96659 Presence of unspecified artificial knee joint: Secondary | ICD-10-CM

## 2021-01-26 DIAGNOSIS — G8929 Other chronic pain: Secondary | ICD-10-CM | POA: Diagnosis not present

## 2021-01-26 MED ORDER — LIDOCAINE HCL 1 % IJ SOLN
3.0000 mL | INTRAMUSCULAR | Status: AC | PRN
  Administered 2021-01-26: 3 mL

## 2021-01-26 MED ORDER — METHYLPREDNISOLONE ACETATE 40 MG/ML IJ SUSP
40.0000 mg | INTRAMUSCULAR | Status: AC | PRN
  Administered 2021-01-26: 40 mg via INTRA_ARTICULAR

## 2021-01-26 NOTE — Progress Notes (Signed)
Office Visit Note   Patient: [name]           Date of Birth: 1945-10-09           MRN: [id number] Visit Date: 01/26/2021              Requested by: Corine Shelter, MD [address] [place],  [place] [address] PCP: Corine Shelter, MD   Assessment & Plan: Visit Diagnoses:  1. Unilateral primary osteoarthritis, left knee   2. History of partial knee replacement   3. Unilateral primary osteoarthritis, right knee   4. Chronic pain of left knee   5. Chronic pain of right knee     Plan: Per the patient's wishes I did provide a steroid injection in both knees which she tolerated well.  We will schedule her a follow-up in 5 months to consider repeat injections.  Follow-Up Instructions: Return in about 5 months (around 06/28/2021).   Orders:  Orders Placed This Encounter  Procedures   Large Joint Inj   Large Joint Inj   No orders of the defined types were placed in this encounter.     Procedures: Large Joint Inj: R knee on 01/26/2021 3:13 PM Indications: diagnostic evaluation and pain Details: 22 G 1.5 in needle, superolateral approach  Arthrogram: No  Medications: 3 mL lidocaine 1 %; 40 mg methylPREDNISolone acetate 40 MG/ML Outcome: tolerated well, no immediate complications Procedure, treatment alternatives, risks and benefits explained, specific risks discussed. Consent was given by the patient. Immediately prior to procedure a time out was called to verify the correct patient, procedure, equipment, support staff and site/side marked as required. Patient was prepped and draped in the usual sterile fashion.    Large Joint Inj: L knee on 01/26/2021 3:14 PM Indications: diagnostic evaluation and pain Details: 22 G 1.5 in needle, superolateral approach  Arthrogram: No  Medications: 3 mL lidocaine 1 %; 40 mg methylPREDNISolone acetate 40 MG/ML Outcome: tolerated well, no immediate complications Procedure, treatment alternatives, risks and benefits  explained, specific risks discussed. Consent was given by the patient. Immediately prior to procedure a time out was called to verify the correct patient, procedure, equipment, support staff and site/side marked as required. Patient was prepped and draped in the usual sterile fashion.      Clinical Data: No additional findings.   Subjective: Chief Complaint  Patient presents with   Right Knee - Pain   Left Knee - Pain   The patient has bilateral total knee partial knee arthroplasties.  This was done years ago in [place].  She has known arthritis in the lateral compartments of both knees and the patellofemoral joint of both knees.  She is 75 years old.  Originally I recommended bilateral total knee arthroplasties.  She comes in from time to time for steroid injection of both knees knowing that this is not going to treat her well for long and there is always risk of infection.  We tried to space the steroid injections at longer intervals.  She said no interval change in her medical status.  She is having some short-term memory issues. HPI  Review of Systems There is no fever, chills, nausea, vomiting  Objective: Vital Signs: There were no vitals taken for this visit.  Physical Exam She is alert and follows commands appropriately Ortho Exam Both knees have well-healed medial surgical incisions.  Both knees have just mild effusion but painful arc of motion bilaterally.  Both knees are ligamentously stable. Specialty Comments:  No  specialty comments available.  Imaging: No results found.   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 No pertinent family history.  Past Surgical History:  Procedure Laterality Date   MEDIAL PARTIAL KNEE REPLACEMENT Bilateral 06/07/12   Social History   Occupational History   Not on file  Tobacco Use   Smoking status: Never   Smokeless tobacco: Never  Substance and Sexual Activity   Alcohol use: No    Alcohol/week: 0.0 standard drinks   Drug use: No   Sexual activity: Never    Birth control/protection: Surgical    Comment: hysterectomy

## 2021-06-10 ENCOUNTER — Ambulatory Visit (INDEPENDENT_AMBULATORY_CARE_PROVIDER_SITE_OTHER): Payer: Medicare PPO | Admitting: Orthopaedic Surgery

## 2021-06-10 ENCOUNTER — Encounter: Payer: Self-pay | Admitting: Orthopaedic Surgery

## 2021-06-10 DIAGNOSIS — M1711 Unilateral primary osteoarthritis, right knee: Secondary | ICD-10-CM

## 2021-06-10 DIAGNOSIS — M1712 Unilateral primary osteoarthritis, left knee: Secondary | ICD-10-CM

## 2021-06-10 DIAGNOSIS — M25562 Pain in left knee: Secondary | ICD-10-CM

## 2021-06-10 DIAGNOSIS — M25561 Pain in right knee: Secondary | ICD-10-CM | POA: Diagnosis not present

## 2021-06-10 DIAGNOSIS — Z96659 Presence of unspecified artificial knee joint: Secondary | ICD-10-CM

## 2021-06-10 DIAGNOSIS — G8929 Other chronic pain: Secondary | ICD-10-CM

## 2021-06-10 MED ORDER — LIDOCAINE HCL 1 % IJ SOLN
3.0000 mL | INTRAMUSCULAR | Status: AC | PRN
  Administered 2021-06-10: 15:00:00 3 mL

## 2021-06-10 MED ORDER — METHYLPREDNISOLONE ACETATE 40 MG/ML IJ SUSP
40.0000 mg | INTRAMUSCULAR | Status: AC | PRN
  Administered 2021-06-10: 15:00:00 40 mg via INTRA_ARTICULAR

## 2021-06-10 NOTE — Progress Notes (Signed)
Office Visit Note   Patient: [name]           Date of Birth: 03-04-1946           MRN: [id number] Visit Date: 06/10/2021              Requested by: Corine Shelter, MD [address] [place],  [place] [address] PCP: Corine Shelter, MD   Assessment & Plan: Visit Diagnoses:  1. Chronic pain of left knee   2. Chronic pain of right knee   3. Unilateral primary osteoarthritis, right knee   4. Unilateral primary osteoarthritis, left knee   5. History of partial knee replacement     Plan: Per her request I did provide a steroid injection of both knees after extensive counseling about the detrimental effects of this can have on her knees and her body in general.  All question concerns were answered addressed.  She knows to wait at least 4 to 5 months between injections.  Follow-Up Instructions: Return if symptoms worsen or fail to improve.   Orders:  Orders Placed This Encounter  Procedures   Large Joint Inj   Large Joint Inj   No orders of the defined types were placed in this encounter.     Procedures: Large Joint Inj: R knee on 06/10/2021 2:59 PM Indications: diagnostic evaluation and pain Details: 22 G 1.5 in needle, superolateral approach  Arthrogram: No  Medications: 3 mL lidocaine 1 %; 40 mg methylPREDNISolone acetate 40 MG/ML Outcome: tolerated well, no immediate complications Procedure, treatment alternatives, risks and benefits explained, specific risks discussed. Consent was given by the patient. Immediately prior to procedure a time out was called to verify the correct patient, procedure, equipment, support staff and site/side marked as required. Patient was prepped and draped in the usual sterile fashion.    Large Joint Inj: L knee on 06/10/2021 2:59 PM Indications: diagnostic evaluation and pain Details: 22 G 1.5 in needle, superolateral approach  Arthrogram: No  Medications: 3 mL lidocaine 1 %; 40 mg methylPREDNISolone acetate 40  MG/ML Outcome: tolerated well, no immediate complications Procedure, treatment alternatives, risks and benefits explained, specific risks discussed. Consent was given by the patient. Immediately prior to procedure a time out was called to verify the correct patient, procedure, equipment, support staff and site/side marked as required. Patient was prepped and draped in the usual sterile fashion.      Clinical Data: No additional findings.   Subjective: Chief Complaint  Patient presents with   Right Knee - Follow-up   Left Knee - Follow-up  The patient comes in today requesting steroid injections in both her knees.  It has been just over 4 months since she had steroid injections in these knees.  She does have a history of medial compartment partial knee replacements that were done I believe at [facility] years ago.  Even then, I recommended total knee arthroplasties due to tricompartment arthritis.  She is still suffering from pain from arthritis in the lateral compartment and patellofemoral joint of both knees.  She is not diabetic.  She does understand that these injections can lead to loosening of the other components and also lead to infection.  She is still willing to try these and has not interested in any other type of surgical intervention right now.  She is 76 years old.  She denies any acute change in her medical status.  She says the injections wore off about a week or 2 ago.  HPI  Review of Systems There is no listed fever, chills  Objective: Vital Signs: There were no vitals taken for this visit.  Physical Exam She is alert and follows commands appropriate Ortho Exam Examination of both knees show well-healed medial surgical incisions.  There is global tenderness but no effusion and pain throughout the arc of motion.  There is no redness. Specialty Comments:  No specialty comments available.  Imaging: No results found.   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 No pertinent family history.  Past Surgical History:  Procedure Laterality Date   MEDIAL PARTIAL KNEE REPLACEMENT Bilateral 06/07/12   Social History   Occupational History   Not on file  Tobacco Use   Smoking status: Never   Smokeless tobacco: Never  Substance and Sexual Activity   Alcohol use: No    Alcohol/week: 0.0 standard drinks   Drug use: No   Sexual activity: Never    Birth control/protection: Surgical    Comment: hysterectomy

## 2021-06-29 ENCOUNTER — Ambulatory Visit: Payer: POS | Admitting: Orthopaedic Surgery

## 2021-07-16 ENCOUNTER — Encounter: Payer: Self-pay | Admitting: Neurology

## 2021-07-16 ENCOUNTER — Ambulatory Visit: Payer: POS | Admitting: Neurology

## 2021-07-20 ENCOUNTER — Ambulatory Visit: Payer: POS | Admitting: Diagnostic Neuroimaging

## 2021-09-01 ENCOUNTER — Other Ambulatory Visit: Payer: Self-pay | Admitting: Pulmonary Disease

## 2021-09-01 DIAGNOSIS — Z1231 Encounter for screening mammogram for malignant neoplasm of breast: Secondary | ICD-10-CM

## 2021-09-09 ENCOUNTER — Ambulatory Visit
Admission: RE | Admit: 2021-09-09 | Discharge: 2021-09-09 | Disposition: A | Discharge status: Home / Self Care | Payer: Medicare PPO | Source: Ambulatory Visit | Attending: Pulmonary Disease | Admitting: Pulmonary Disease

## 2021-09-09 DIAGNOSIS — Z1231 Encounter for screening mammogram for malignant neoplasm of breast: Secondary | ICD-10-CM

## 2021-10-08 ENCOUNTER — Ambulatory Visit (INDEPENDENT_AMBULATORY_CARE_PROVIDER_SITE_OTHER): Payer: POS | Admitting: Orthopaedic Surgery

## 2021-10-08 ENCOUNTER — Encounter: Payer: Self-pay | Admitting: Orthopaedic Surgery

## 2021-10-08 DIAGNOSIS — M25561 Pain in right knee: Secondary | ICD-10-CM | POA: Diagnosis not present

## 2021-10-08 DIAGNOSIS — M25562 Pain in left knee: Secondary | ICD-10-CM

## 2021-10-08 DIAGNOSIS — G8929 Other chronic pain: Secondary | ICD-10-CM

## 2021-10-08 MED ORDER — METHYLPREDNISOLONE ACETATE 40 MG/ML IJ SUSP
40.0000 mg | INTRAMUSCULAR | Status: AC | PRN
  Administered 2021-10-08: 40 mg via INTRA_ARTICULAR

## 2021-10-08 MED ORDER — METHYLPREDNISOLONE ACETATE 40 MG/ML IJ SUSP
40.0000 mg | INTRAMUSCULAR | Status: AC | PRN
Start: 2021-10-08 — End: 2021-10-08
  Administered 2021-10-08: 40 mg via INTRA_ARTICULAR

## 2021-10-08 MED ORDER — LIDOCAINE HCL 1 % IJ SOLN
3.0000 mL | INTRAMUSCULAR | Status: AC | PRN
  Administered 2021-10-08: 3 mL

## 2021-10-08 NOTE — Progress Notes (Signed)
Office Visit Note   Patient: [name]           Date of Birth: Aug 18, 1945           MRN: [id number] Visit Date: 10/08/2021              Requested by: Vincente Liberty, MD [address] [place],  [place] [address] PCP: Vincente Liberty, MD   Assessment & Plan: Visit Diagnoses:  1. Chronic pain of left knee   2. Chronic pain of right knee     Plan: I did place a steroid injection in both knees without difficulty.  I told her to wait at least 5 months or more between steroid injections and again reiterated the risk of this worsening her knees.  Follow-Up Instructions: Return if symptoms worsen or fail to improve.   Orders:  Orders Placed This Encounter  Procedures   Large Joint Inj   Large Joint Inj   No orders of the defined types were placed in this encounter.     Procedures: Large Joint Inj: R knee on 10/08/2021 12:45 PM Indications: diagnostic evaluation and pain Details: 22 G 1.5 in needle, superolateral approach  Arthrogram: No  Medications: 3 mL lidocaine 1 %; 40 mg methylPREDNISolone acetate 40 MG/ML Outcome: tolerated well, no immediate complications Procedure, treatment alternatives, risks and benefits explained, specific risks discussed. Consent was given by the patient. Immediately prior to procedure a time out was called to verify the correct patient, procedure, equipment, support staff and site/side marked as required. Patient was prepped and draped in the usual sterile fashion.    Large Joint Inj: L knee on 10/08/2021 12:45 PM Indications: diagnostic evaluation and pain Details: 22 G 1.5 in needle, superolateral approach  Arthrogram: No  Medications: 3 mL lidocaine 1 %; 40 mg methylPREDNISolone acetate 40 MG/ML Outcome: tolerated well, no immediate complications Procedure, treatment alternatives, risks and benefits explained, specific risks discussed. Consent was given by the patient. Immediately prior to procedure a time out was called to  verify the correct patient, procedure, equipment, support staff and site/side marked as required. Patient was prepped and draped in the usual sterile fashion.      Clinical Data: No additional findings.   Subjective: Chief Complaint  Patient presents with   Left Knee - Follow-up   Right Knee - Follow-up  The patient has bilateral medial compartment partial knee arthroplasties.  This was done in [place] I believe.  I had recommended total knee arthroplasties years ago when she had these done.  She has severe track of arthritis in both knees.  She comes in time to time for steroid injections in her knees and I told her this can become medically risky in terms of increased risk of infection with multiple steroid injections in the knees that have metal components.  She is 76 years old and just states that as needed continue to try to work with her.  Again, stressed that I would really like her to wait even longer before having injections again or consider conversion to knee replacements.  HPI  Review of Systems There is currently listed no fever, chills, nausea, vomiting  Objective: Vital Signs: There were no vitals taken for this visit.  Physical Exam She is alert and orient x3 and in no acute distress Ortho Exam Examination of both knees show well-healed surgical incisions medially.  There is no significant effusion but painful arc of motion of both knees that are significant arthritic. Specialty Comments:  No  specialty comments available.  Imaging: No results found.   PMFS History: Patient Active Problem List   Diagnosis Date Noted   Pneumonia due to COVID-19 virus 02/26/2019   Respiratory failure (HCC)/ hypoxic 02/26/2019   Essential hypertension 02/26/2019   Dyslipidemia 02/26/2019   GERD (gastroesophageal reflux disease) 02/26/2019   Bunion 08/21/2014   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   Onychomycosis 02/28/2013   Pain, foot 02/28/2013   Past Medical  History:  Diagnosis Date   Hypertension     History reviewed. No pertinent family history.  Past Surgical History:  Procedure Laterality Date   MEDIAL PARTIAL KNEE REPLACEMENT Bilateral 06/07/12   Social History   Occupational History   Not on file  Tobacco Use   Smoking status: Never   Smokeless tobacco: Never  Substance and Sexual Activity   Alcohol use: No    Alcohol/week: 0.0 standard drinks   Drug use: No   Sexual activity: Never    Birth control/protection: Surgical    Comment: hysterectomy

## 2021-11-23 ENCOUNTER — Ambulatory Visit: Payer: Medicare PPO | Admitting: Physician Assistant

## 2022-01-11 ENCOUNTER — Ambulatory Visit (INDEPENDENT_AMBULATORY_CARE_PROVIDER_SITE_OTHER): Payer: Medicare PPO | Admitting: Orthopaedic Surgery

## 2022-01-11 DIAGNOSIS — M25561 Pain in right knee: Secondary | ICD-10-CM | POA: Diagnosis not present

## 2022-01-11 DIAGNOSIS — M25562 Pain in left knee: Secondary | ICD-10-CM | POA: Diagnosis not present

## 2022-01-11 DIAGNOSIS — Z96659 Presence of unspecified artificial knee joint: Secondary | ICD-10-CM

## 2022-01-11 DIAGNOSIS — G8929 Other chronic pain: Secondary | ICD-10-CM

## 2022-01-11 MED ORDER — METHYLPREDNISOLONE ACETATE 40 MG/ML IJ SUSP
40.0000 mg | INTRAMUSCULAR | Status: AC | PRN
  Administered 2022-01-11: 40 mg via INTRA_ARTICULAR

## 2022-01-11 MED ORDER — LIDOCAINE HCL 1 % IJ SOLN
3.0000 mL | INTRAMUSCULAR | Status: AC | PRN
  Administered 2022-01-11: 3 mL

## 2022-01-11 NOTE — Progress Notes (Signed)
The patient comes in today requesting steroid injections in both her knees.  She is a 76 year old female and has tricompartment arthritis but she does have a history of bilateral partial knee arthroplasties that were done elsewhere.  She does get injections from time to time.  She is not diabetic.  She understands that vision I cannot help her much longer and my concern is causing loosening of the partial knee arthroplasties as well as potential for infection.  She still would like [place] to proceed with injections today.  I did place a steroid injection of both knees today.  I would like her to wait at least until January or February of next year before repeating these.  The only other option is knee replacement/knee revision surgery.  She understands this as well.  Procedure Note  Patient: [name]             Date of Birth: [birth year]           MRN: [id number]             Visit Date: 01/11/2022  Procedures: Visit Diagnoses: No diagnosis found.  Large Joint Inj: R knee on 01/11/2022 11:12 AM Indications: diagnostic evaluation and pain Details: 22 G 1.5 in needle, superolateral approach  Arthrogram: No  Medications: 3 mL lidocaine 1 %; 40 mg methylPREDNISolone acetate 40 MG/ML Outcome: tolerated well, no immediate complications Procedure, treatment alternatives, risks and benefits explained, specific risks discussed. Consent was given by the patient. Immediately prior to procedure a time out was called to verify the correct patient, procedure, equipment, support staff and site/side marked as required. Patient was prepped and draped in the usual sterile fashion.    Large Joint Inj: L knee on 01/11/2022 11:12 AM Indications: diagnostic evaluation and pain Details: 22 G 1.5 in needle, superolateral approach  Arthrogram: No  Medications: 3 mL lidocaine 1 %; 40 mg methylPREDNISolone acetate 40 MG/ML Outcome: tolerated well, no immediate complications Procedure, treatment alternatives,  risks and benefits explained, specific risks discussed. Consent was given by the patient. Immediately prior to procedure a time out was called to verify the correct patient, procedure, equipment, support staff and site/side marked as required. Patient was prepped and draped in the usual sterile fashion.

## 2022-04-14 ENCOUNTER — Ambulatory Visit: Payer: Medicare PPO | Admitting: Orthopaedic Surgery

## 2022-05-24 ENCOUNTER — Encounter: Payer: Self-pay | Admitting: Physician Assistant

## 2022-05-24 ENCOUNTER — Ambulatory Visit (INDEPENDENT_AMBULATORY_CARE_PROVIDER_SITE_OTHER): Payer: Medicare PPO | Admitting: Physician Assistant

## 2022-05-24 ENCOUNTER — Other Ambulatory Visit: Payer: Self-pay | Admitting: Physician Assistant

## 2022-05-24 DIAGNOSIS — M1711 Unilateral primary osteoarthritis, right knee: Secondary | ICD-10-CM | POA: Diagnosis not present

## 2022-05-24 DIAGNOSIS — M170 Bilateral primary osteoarthritis of knee: Secondary | ICD-10-CM | POA: Diagnosis not present

## 2022-05-24 DIAGNOSIS — M1712 Unilateral primary osteoarthritis, left knee: Secondary | ICD-10-CM

## 2022-05-24 MED ORDER — METHYLPREDNISOLONE ACETATE 40 MG/ML IJ SUSP
40.0000 mg | INTRAMUSCULAR | Status: AC | PRN
  Administered 2022-05-24: 40 mg via INTRA_ARTICULAR

## 2022-05-24 MED ORDER — LIDOCAINE HCL 1 % IJ SOLN
3.0000 mL | INTRAMUSCULAR | Status: AC | PRN
Start: 2022-05-24 — End: 2022-05-24
  Administered 2022-05-24: 3 mL

## 2022-05-24 MED ORDER — LIDOCAINE HCL 1 % IJ SOLN
3.0000 mL | INTRAMUSCULAR | Status: AC | PRN
  Administered 2022-05-24: 3 mL

## 2022-05-24 NOTE — Progress Notes (Unsigned)
   Procedure Note  Patient: [name]             Date of Birth: [birth year]           MRN: [id number]             Visit Date: 05/24/2022  Procedures: Visit Diagnoses: No diagnosis found.  No procedures performed

## 2022-05-24 NOTE — Progress Notes (Signed)
   Procedure Note  Patient: [name]             Date of Birth: 22-Aug-1945           MRN: [id number]             Visit Date: 05/24/2022 HPI: Mrs. Aronov comes in today requesting bilateral knee injections.  Last injections were given a 06/24/21.  She has had no falls or injuries.  She does have a history of bilateral partial knee replacements.  She states the last injections helped until about 2 weeks ago.  States that her left knee is bothering her more than the right.  No fevers or chills.  She is nondiabetic.  Review of systems: See HPI otherwise negative  Physical exam: General well-developed well-nourished female no acute distress ambulates without any assistive device. Bilateral knees: No abnormal warmth erythema.  Good range of motion of both knees.  Tenderness lateral joint line left knee.  Procedures: Visit Diagnoses:  1. Unilateral primary osteoarthritis, right knee   2. Unilateral primary osteoarthritis, left knee     Large Joint Inj: bilateral knee on 05/24/2022 8:47 AM Indications: pain Details: 22 G 1.5 in needle, anterolateral approach  Arthrogram: No  Medications (Right): 3 mL lidocaine 1 %; 40 mg methylPREDNISolone acetate 40 MG/ML Medications (Left): 3 mL lidocaine 1 %; 40 mg methylPREDNISolone acetate 40 MG/ML Outcome: tolerated well, no immediate complications Procedure, treatment alternatives, risks and benefits explained, specific risks discussed. Consent was given by the patient. Immediately prior to procedure a time out was called to verify the correct patient, procedure, equipment, support staff and site/side marked as required. Patient was prepped and draped in the usual sterile fashion.      Plan: Prior to injecting both knees reviewed which include infection and loosening of components given her prior partial knee replacements.  She understands the risk.  Therefore we will proceed with bilateral knee injections which she tolerated well.  Recommend waiting  at least 6 months prior to having repeat injections of both knees.  Questions were encouraged and answered

## 2022-05-26 ENCOUNTER — Ambulatory Visit: Payer: Medicare PPO | Admitting: Orthopaedic Surgery

## 2022-08-05 ENCOUNTER — Other Ambulatory Visit: Payer: Self-pay | Admitting: Pulmonary Disease

## 2022-08-05 DIAGNOSIS — Z1231 Encounter for screening mammogram for malignant neoplasm of breast: Secondary | ICD-10-CM

## 2022-09-21 ENCOUNTER — Ambulatory Visit: Payer: Medicare PPO

## 2022-09-24 ENCOUNTER — Ambulatory Visit
Admission: RE | Admit: 2022-09-24 | Discharge: 2022-09-24 | Disposition: A | Discharge status: Home / Self Care | Payer: Medicare PPO | Source: Ambulatory Visit | Attending: Pulmonary Disease | Admitting: Pulmonary Disease

## 2022-09-24 DIAGNOSIS — Z1231 Encounter for screening mammogram for malignant neoplasm of breast: Secondary | ICD-10-CM

## 2022-11-25 ENCOUNTER — Encounter: Payer: Self-pay | Admitting: Physician Assistant

## 2022-11-25 ENCOUNTER — Other Ambulatory Visit (INDEPENDENT_AMBULATORY_CARE_PROVIDER_SITE_OTHER): Payer: Medicare PPO

## 2022-11-25 ENCOUNTER — Ambulatory Visit (INDEPENDENT_AMBULATORY_CARE_PROVIDER_SITE_OTHER): Payer: Medicare PPO | Admitting: Physician Assistant

## 2022-11-25 DIAGNOSIS — M1712 Unilateral primary osteoarthritis, left knee: Secondary | ICD-10-CM

## 2022-11-25 DIAGNOSIS — M170 Bilateral primary osteoarthritis of knee: Secondary | ICD-10-CM | POA: Diagnosis not present

## 2022-11-25 DIAGNOSIS — M1711 Unilateral primary osteoarthritis, right knee: Secondary | ICD-10-CM | POA: Diagnosis not present

## 2022-11-25 MED ORDER — LIDOCAINE HCL 1 % IJ SOLN
3.0000 mL | INTRAMUSCULAR | Status: AC | PRN
  Administered 2022-11-25: 3 mL

## 2022-11-25 MED ORDER — METHYLPREDNISOLONE ACETATE 40 MG/ML IJ SUSP
40.0000 mg | INTRAMUSCULAR | Status: AC | PRN
  Administered 2022-11-25: 40 mg via INTRA_ARTICULAR

## 2022-11-25 NOTE — Progress Notes (Addendum)
Office Visit Note   Patient: [name]           Date of Birth: [birth year]           MRN: [id number] Visit Date: 11/25/2022              Requested by: Corine Shelter, MD [address] [place],  [place] [address] PCP: Corine Shelter, MD   Assessment & Plan: Visit Diagnoses:  1. Unilateral primary osteoarthritis, right knee   2. Unilateral primary osteoarthritis, left knee     Plan:  Patient tolerated the injections well today.  She knows to wait least 6 months between injections.  Questions encouraged and answered at length.  Follow-Up Instructions: Return if symptoms worsen or fail to improve.   Orders:  Orders Placed This Encounter  Procedures   Large Joint Inj   XR Knee 1-2 Views Left   XR Knee 1-2 Views Right   No orders of the defined types were placed in this encounter.     Procedures: Large Joint Inj: bilateral knee on 11/25/2022 11:33 AM Indications: pain Details: 22 G 1.5 in needle, anterolateral approach  Arthrogram: No  Medications (Right): 3 mL lidocaine 1 %; 40 mg methylPREDNISolone acetate 40 MG/ML Medications (Left): 3 mL lidocaine 1 %; 40 mg methylPREDNISolone acetate 40 MG/ML Outcome: tolerated well, no immediate complications Procedure, treatment alternatives, risks and benefits explained, specific risks discussed. Consent was given by the patient. Immediately prior to procedure a time out was called to verify the correct patient, procedure, equipment, support staff and site/side marked as required. Patient was prepped and draped in the usual sterile fashion.       Clinical Data: No additional findings.   Subjective: Chief Complaint  Patient presents with   Right Knee - Pain, Follow-up   Left Knee - Pain, Follow-up    HPI [name] comes in today requesting cortisone injection of both knees.  Last injections 05/24/2022.  States the injections did well until about 2 weeks ago.  She denies any fevers chills.  Eyes any  falls or injuries to either knee.  She has a history of bilateral partial medial knee arthroplasty.  Review of Systems  Constitutional:  Negative for chills and fever.     Objective: Vital Signs: There were no vitals taken for this visit.  Physical Exam Constitutional:      Appearance: She is not ill-appearing or diaphoretic.  Pulmonary:     Effort: Pulmonary effort is normal.  Neurological:     Mental Status: She is alert and oriented to person, place, and time.     Ortho Exam Bilateral knees good range of motion of both knees.  She has tenderness over the medial and lateral joint line of both knees.  Patellofemoral crepitus both knees.  No abnormal warmth erythema or effusion of either Specialty Comments:  No specialty comments available.  Imaging: XR Knee 1-2 Views Right  Result Date: 11/25/2022 Right knee 2 views compared to 2021 unchanged.  Again failure of the partial knee replacement by tibial component.  Moderately severe patellofemoral changes and mild to moderate lateral compartmental changes.  No acute findings or fractures.  XR Knee 1-2 Views Left  Result Date: 11/25/2022 Left knee 2 views: Severe patellofemoral arthritic changes moderate to moderately severe lateral compartmental arthritic changes.  Medial compartment partial knee replacement components are unchanged in overall position.  There appears to be some failure particularly of the tibial component but again this is unchanged from films  2021.    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 No pertinent family history.  Past Surgical History:  Procedure Laterality Date   BREAST EXCISIONAL BIOPSY Bilateral    MEDIAL PARTIAL KNEE REPLACEMENT Bilateral 06/07/2012   Social History   Occupational History   Not on file  Tobacco Use   Smoking status: Never   Smokeless tobacco: Never  Substance and Sexual Activity   Alcohol use: No    Alcohol/week: 0.0 standard drinks of alcohol   Drug use: No   Sexual activity: Never    Birth control/protection: Surgical    Comment: hysterectomy

## 2022-11-25 NOTE — Addendum Note (Signed)
Addended by: Richardean Canal on: 11/25/2022 11:34 AM   Modules accepted: Level of Service

## 2022-12-07 ENCOUNTER — Other Ambulatory Visit (HOSPITAL_COMMUNITY): Payer: Self-pay | Admitting: Pulmonary Disease

## 2022-12-07 ENCOUNTER — Ambulatory Visit
Admission: RE | Admit: 2022-12-07 | Discharge: 2022-12-07 | Disposition: A | Discharge status: Home / Self Care | Payer: Medicare PPO | Source: Ambulatory Visit | Attending: Pulmonary Disease | Admitting: Pulmonary Disease

## 2022-12-07 DIAGNOSIS — M545 Low back pain, unspecified: Secondary | ICD-10-CM

## 2022-12-23 ENCOUNTER — Ambulatory Visit: Payer: Medicare PPO | Admitting: Physician Assistant

## 2022-12-27 ENCOUNTER — Ambulatory Visit: Payer: Medicare PPO | Admitting: Physician Assistant

## 2023-01-16 ENCOUNTER — Encounter (HOSPITAL_COMMUNITY): Payer: Self-pay

## 2023-01-16 ENCOUNTER — Other Ambulatory Visit: Payer: Self-pay

## 2023-01-16 ENCOUNTER — Emergency Department (HOSPITAL_COMMUNITY)
Admission: EM | Admit: 2023-01-16 | Discharge: 2023-01-16 | Disposition: A | Discharge status: Home / Self Care | Payer: Medicare PPO | Attending: Emergency Medicine | Admitting: Emergency Medicine

## 2023-01-16 DIAGNOSIS — Z96653 Presence of artificial knee joint, bilateral: Secondary | ICD-10-CM | POA: Insufficient documentation

## 2023-01-16 DIAGNOSIS — M545 Low back pain, unspecified: Secondary | ICD-10-CM | POA: Insufficient documentation

## 2023-01-16 DIAGNOSIS — M549 Dorsalgia, unspecified: Secondary | ICD-10-CM | POA: Diagnosis present

## 2023-01-16 DIAGNOSIS — Z79899 Other long term (current) drug therapy: Secondary | ICD-10-CM | POA: Diagnosis not present

## 2023-01-16 DIAGNOSIS — M25552 Pain in left hip: Secondary | ICD-10-CM | POA: Diagnosis not present

## 2023-01-16 DIAGNOSIS — I10 Essential (primary) hypertension: Secondary | ICD-10-CM | POA: Diagnosis not present

## 2023-01-16 DIAGNOSIS — Z7982 Long term (current) use of aspirin: Secondary | ICD-10-CM | POA: Insufficient documentation

## 2023-01-16 LAB — URINALYSIS, ROUTINE W REFLEX MICROSCOPIC
Bacteria, UA: NONE SEEN
Glucose, UA: NEGATIVE mg/dL
Hgb urine dipstick: NEGATIVE
Ketones, ur: NEGATIVE mg/dL
Nitrite: NEGATIVE
Protein, ur: 30 mg/dL — AB
Specific Gravity, Urine: 1.027 (ref 1.005–1.030)
pH: 5 (ref 5.0–8.0)

## 2023-01-16 MED ORDER — LIDOCAINE 5 % EX PTCH
1.0000 | MEDICATED_PATCH | CUTANEOUS | Status: DC
  Administered 2023-01-16: 1 via TRANSDERMAL
  Filled 2023-01-16: qty 1

## 2023-01-16 MED ORDER — OXYCODONE-ACETAMINOPHEN 5-325 MG PO TABS: 1.0000 | ORAL_TABLET | Freq: Four times a day (QID) | ORAL | 0 refills | Status: DC | PRN

## 2023-01-16 MED ORDER — KETOROLAC TROMETHAMINE 60 MG/2ML IM SOLN
30.0000 mg | Freq: Once | INTRAMUSCULAR | Status: AC
  Administered 2023-01-16: 30 mg via INTRAMUSCULAR
  Filled 2023-01-16: qty 2

## 2023-01-16 MED ORDER — ACETAMINOPHEN 325 MG PO TABS
650.0000 mg | ORAL_TABLET | Freq: Once | ORAL | Status: AC
  Administered 2023-01-16: 650 mg via ORAL
  Filled 2023-01-16: qty 2

## 2023-01-16 MED ORDER — LIDOCAINE 4 % EX PTCH: 1.0000 | MEDICATED_PATCH | CUTANEOUS | 0 refills | Status: DC

## 2023-01-16 NOTE — Discharge Instructions (Addendum)
Please take your medications as prescribed. Take tylenol/ibuprofen or Percocet as needed for pain. I recommend close follow-up with PCP for reevaluation.  Please do not hesitate to return to emergency department if worrisome signs symptoms we discussed become apparent.

## 2023-01-16 NOTE — ED Triage Notes (Signed)
Pt arrived POV from home c/o lower back pain for several weeks but today just could not take it anymore.

## 2023-01-16 NOTE — ED Provider Notes (Signed)
[place] EMERGENCY DEPARTMENT AT [facility] Provider Note   CSN: [id number] Arrival date & time: 01/16/23  0844     History  Chief Complaint  Patient presents with   Back Pain    [name] is a 77 y.o. female history of hypertension presents today for evaluation of back pain.  Patient states that she has has back pain for about 2 weeks now.  Pain is on the left side of her lower back, above the left hip, nonradiating.  She denies any recent fall or injury.  She denies any urinary symptoms, urinary retention, bowel incontinence, saddle anesthesia, distal weakness.  Denies any fever.  States she has been using Biofreeze at home with no relief.  Back Pain   Past Medical History:  Diagnosis Date   Hypertension    Past Surgical History:  Procedure Laterality Date   BREAST EXCISIONAL BIOPSY Bilateral    MEDIAL PARTIAL KNEE REPLACEMENT Bilateral 06/07/2012     Home Medications Prior to Admission medications   Medication Sig Start Date End Date Taking? Authorizing Provider  lidocaine (HM LIDOCAINE PATCH) 4 % Place 1 patch onto the skin daily. 01/16/23  Yes Jeanelle Malling, PA  oxyCODONE-acetaminophen (PERCOCET/ROXICET) 5-325 MG tablet Take 1 tablet by mouth every 6 (six) hours as needed for severe pain. 01/16/23  Yes Jeanelle Malling, PA  albuterol (VENTOLIN HFA) 108 (90 Base) MCG/ACT inhaler Inhale 1-2 puffs into the lungs every 6 (six) hours as needed for wheezing or shortness of breath. 01/08/21   Particia Nearing, PA-C  aspirin EC 81 MG tablet Take 81 mg by mouth daily.    [provider]  cephALEXin (KEFLEX) 500 MG capsule Take 1 capsule (500 mg total) by mouth 2 (two) times daily. 01/02/21   Wallis Bamberg, PA-C  Diethylpropion HCl CR 75 MG TB24 Take 75 mg by mouth daily. Reported on 06/03/2015 07/17/14   [provider]  hydrochlorothiazide (HYDRODIURIL) 12.5 MG tablet Take 12.5 mg by mouth daily.  02/06/19   [provider]  losartan (COZAAR) 50 MG tablet  Take 50 mg by mouth daily. 02/06/19   [provider]  omeprazole (PRILOSEC) 40 MG capsule Take 40 mg by mouth daily.  08/01/14   [provider]  rosuvastatin (CRESTOR) 5 MG tablet Take 5 mg by mouth daily.    [provider]      Allergies    Shellfish-derived products, Codeine, Tetracyclines & related, and Vibramycin [doxycycline calcium]    Review of Systems   Review of Systems  Musculoskeletal:  Positive for back pain.    Physical Exam Updated Vital Signs BP 128/66   Pulse 94   Temp 98 F (36.7 C) (Oral)   Resp 12   Ht 5\' 4"  (1.626 m)   Wt 90.7 kg   SpO2 94%   BMI 34.33 kg/m  Physical Exam Vitals and nursing note reviewed.  Constitutional:      Appearance: Normal appearance.  HENT:     Head: Normocephalic and atraumatic.     Mouth/Throat:     Mouth: Mucous membranes are moist.  Eyes:     General: No scleral icterus. Cardiovascular:     Rate and Rhythm: Normal rate and regular rhythm.     Pulses: Normal pulses.     Heart sounds: Normal heart sounds.  Pulmonary:     Effort: Pulmonary effort is normal.     Breath sounds: Normal breath sounds.  Abdominal:     General: Abdomen is flat.  Palpations: Abdomen is soft.     Tenderness: There is no abdominal tenderness.  Musculoskeletal:        General: No deformity.     Comments: Tenderness to palpation to paraspinal muscles of the lower back on the left side.  Negative straight leg raise.  Negative CVA tenderness.  Skin:    General: Skin is warm.     Findings: No rash.  Neurological:     General: No focal deficit present.     Mental Status: She is alert.  Psychiatric:        Mood and Affect: Mood normal.     ED Results / Procedures / Treatments   Labs (all labs ordered are listed, but only abnormal results are displayed) Labs Reviewed  URINALYSIS, ROUTINE W REFLEX MICROSCOPIC - Abnormal; Notable for the following components:      Result Value   APPearance HAZY (*)    Bilirubin  Urine SMALL (*)    Protein, ur 30 (*)    Leukocytes,Ua SMALL (*)    All other components within normal limits    EKG None  Radiology No results found.  Procedures Procedures    Medications Ordered in ED Medications  lidocaine (LIDODERM) 5 % 1 patch (1 patch Transdermal Patch Applied 01/16/23 0942)  acetaminophen (TYLENOL) tablet 650 mg (650 mg Oral Given 01/16/23 0941)  ketorolac (TORADOL) injection 30 mg (30 mg Intramuscular Given 01/16/23 0944)    ED Course/ Medical Decision Making/ A&P                                 Medical Decision Making Amount and/or Complexity of Data Reviewed Labs: ordered.  Risk OTC drugs. Prescription drug management.   This patient presents to the ED for lower back pain, this involves an extensive number of treatment options, and is a complaint that carries with a high risk of complications and morbidity.  The differential diagnosis includes musculoskeletal pain, spinal stenosis, herniated disc, UTI, pyelonephritis, cardiac equina.  This is not an exhaustive list.  Problem list/ ED course/ Critical interventions/ Medical management: HPI: See above Vital signs within normal range and stable throughout visit. Laboratory/imaging studies significant for: See above. On physical examination, patient is afebrile and appears in no acute distress.  Patient had a x-ray of the lumbar spine done on 12/13/2022 which showed mild to moderate degenerative changes. I have low suspicion for malignancy. Differential diagnoses includes lumbago versus musculoskeletal spasm / strain versus sciatica. Less likely sciatica as straight leg raise test was negative. No back pain red flags on history or physical. Presentation not consistent with malignancy (lack of history of malignancy, lack of B symptoms), fracture (no trauma, no bony tenderness to palpation), cauda equina (no bowel or urinary incontinence/retention, no saddle anesthesia, no distal weakness), epidural abscess (no  IVDU, vertebral tenderness), renal colic, pyelonephritis (afebrile, no CVAT, no urinary symptoms). Given the clinical picture, no indication for imaging at this time.  Given Toradol, Tylenol and lidocaine patch for pain.  Reevaluation of patient after this medication showed that symptom improved. Advised patient to take Tylenol/ibuprofen/naproxen for pain, follow-up with primary care physician for further evaluation and management, return to the ER if new or worsening symptoms. I have reviewed the patient home medicines and have made adjustments as needed.  Cardiac monitoring/EKG: The patient was maintained on a cardiac monitor.  I personally reviewed and interpreted the cardiac monitor which showed an underlying rhythm of:  sinus rhythm.  Additional history obtained: External records from outside source obtained and reviewed including: Chart review including previous notes, labs, imaging.  Consultations obtained:  Disposition Continued outpatient therapy. Follow-up with PCP recommended for reevaluation of symptoms. Treatment plan discussed with patient.  Pt acknowledged understanding was agreeable to the plan. Worrisome signs and symptoms were discussed with patient, and patient acknowledged understanding to return to the ED if they noticed these signs and symptoms. Patient was stable upon discharge.   This chart was dictated using voice recognition software.  Despite best efforts to proofread,  errors can occur which can change the documentation meaning.          Final Clinical Impression(s) / ED Diagnoses Final diagnoses:  Acute left-sided low back pain without sciatica    Rx / DC Orders ED Discharge Orders          Ordered    lidocaine (HM LIDOCAINE PATCH) 4 %  Every 24 hours        01/16/23 1033    oxyCODONE-acetaminophen (PERCOCET/ROXICET) 5-325 MG tablet  Every 6 hours PRN        01/16/23 1033              Jeanelle Malling, [place] 01/16/23 1037    Rolan Bucco, MD 01/17/23  [phone]

## 2023-01-24 ENCOUNTER — Ambulatory Visit (INDEPENDENT_AMBULATORY_CARE_PROVIDER_SITE_OTHER): Payer: Medicare PPO | Admitting: Physician Assistant

## 2023-01-24 DIAGNOSIS — M1711 Unilateral primary osteoarthritis, right knee: Secondary | ICD-10-CM

## 2023-01-24 DIAGNOSIS — M1712 Unilateral primary osteoarthritis, left knee: Secondary | ICD-10-CM

## 2023-01-24 NOTE — Progress Notes (Signed)
HPI: [name] comes in today requesting cortisone injections both knees.  She has had bilateral knee Uni arthroplasties.  These showed failure of the radiographs.  She has not interested in any type of surgical intervention.  She is wanting cortisone injections again in both knees.  However the last injections were on 711 is too early.  She has had no new injury to either knee.  She denies any fevers or chills. She understands that we usually wait 6 months between injections but would be willing to reinject both knees after October 11.  She will follow-up then for injections in both knees.  Questions were encouraged and answered.  No charge for today's office visit.

## 2023-02-26 ENCOUNTER — Other Ambulatory Visit: Payer: Self-pay

## 2023-02-26 ENCOUNTER — Emergency Department (HOSPITAL_COMMUNITY): Payer: Medicare PPO

## 2023-02-26 ENCOUNTER — Encounter (HOSPITAL_COMMUNITY): Payer: Self-pay

## 2023-02-26 ENCOUNTER — Emergency Department (HOSPITAL_COMMUNITY)
Admission: EM | Admit: 2023-02-26 | Discharge: 2023-02-26 | Disposition: A | Discharge status: Home / Self Care | Payer: Medicare PPO | Attending: Emergency Medicine | Admitting: Emergency Medicine

## 2023-02-26 DIAGNOSIS — M549 Dorsalgia, unspecified: Secondary | ICD-10-CM | POA: Diagnosis not present

## 2023-02-26 DIAGNOSIS — Z7982 Long term (current) use of aspirin: Secondary | ICD-10-CM | POA: Diagnosis not present

## 2023-02-26 DIAGNOSIS — W19XXXA Unspecified fall, initial encounter: Secondary | ICD-10-CM

## 2023-02-26 DIAGNOSIS — I10 Essential (primary) hypertension: Secondary | ICD-10-CM | POA: Diagnosis not present

## 2023-02-26 DIAGNOSIS — S0083XA Contusion of other part of head, initial encounter: Secondary | ICD-10-CM | POA: Diagnosis not present

## 2023-02-26 DIAGNOSIS — W01198A Fall on same level from slipping, tripping and stumbling with subsequent striking against other object, initial encounter: Secondary | ICD-10-CM | POA: Insufficient documentation

## 2023-02-26 DIAGNOSIS — Z79899 Other long term (current) drug therapy: Secondary | ICD-10-CM | POA: Insufficient documentation

## 2023-02-26 DIAGNOSIS — Y92511 Restaurant or cafe as the place of occurrence of the external cause: Secondary | ICD-10-CM | POA: Diagnosis not present

## 2023-02-26 DIAGNOSIS — R519 Headache, unspecified: Secondary | ICD-10-CM | POA: Diagnosis present

## 2023-02-26 LAB — CBC WITH DIFFERENTIAL/PLATELET
Abs Immature Granulocytes: 0.07 10*3/uL (ref 0.00–0.07)
Basophils Absolute: 0 10*3/uL (ref 0.0–0.1)
Basophils Relative: 0 %
Eosinophils Absolute: 0 10*3/uL (ref 0.0–0.5)
Eosinophils Relative: 0 %
HCT: 45.9 % (ref 36.0–46.0)
Hemoglobin: 14.5 g/dL (ref 12.0–15.0)
Immature Granulocytes: 1 %
Lymphocytes Relative: 13 %
Lymphs Abs: 1.3 10*3/uL (ref 0.7–4.0)
MCH: 26.8 pg (ref 26.0–34.0)
MCHC: 31.6 g/dL (ref 30.0–36.0)
MCV: 84.7 fL (ref 80.0–100.0)
Monocytes Absolute: 0.7 10*3/uL (ref 0.1–1.0)
Monocytes Relative: 7 %
Neutro Abs: 7.9 10*3/uL — ABNORMAL HIGH (ref 1.7–7.7)
Neutrophils Relative %: 79 %
Platelets: 306 10*3/uL (ref 150–400)
RBC: 5.42 MIL/uL — ABNORMAL HIGH (ref 3.87–5.11)
RDW: 17.9 % — ABNORMAL HIGH (ref 11.5–15.5)
WBC: 10 10*3/uL (ref 4.0–10.5)
nRBC: 0 % (ref 0.0–0.2)

## 2023-02-26 LAB — BASIC METABOLIC PANEL
Anion gap: 8 (ref 5–15)
BUN: 11 mg/dL (ref 8–23)
CO2: 24 mmol/L (ref 22–32)
Calcium: 8.4 mg/dL — ABNORMAL LOW (ref 8.9–10.3)
Chloride: 105 mmol/L (ref 98–111)
Creatinine, Ser: 0.87 mg/dL (ref 0.44–1.00)
GFR, Estimated: >60 mL/min (ref >60)
Glucose, Bld: 125 mg/dL — ABNORMAL HIGH (ref 70–99)
Potassium: 3.3 mmol/L — ABNORMAL LOW (ref 3.5–5.1)
Sodium: 137 mmol/L (ref 135–145)

## 2023-02-26 LAB — PROTIME-INR
INR: 1 (ref 0.8–1.2)
Prothrombin Time: 13.8 s (ref 11.4–15.2)

## 2023-02-26 MED ORDER — ACETAMINOPHEN 325 MG PO TABS
650.0000 mg | ORAL_TABLET | Freq: Once | ORAL | Status: AC
  Administered 2023-02-26: 650 mg via ORAL
  Filled 2023-02-26: qty 2

## 2023-02-26 MED ORDER — CYCLOBENZAPRINE HCL 5 MG PO TABS: 5.0000 mg | ORAL_TABLET | Freq: Three times a day (TID) | ORAL | 0 refills | Status: DC | PRN

## 2023-02-26 NOTE — ED Triage Notes (Signed)
EMS reports from home, Pt states trip and fall and struck face [place] [employer].. No visible LAC or injury, redness to left cheek. No LOC, Possible Blood thinner, (Pt not sure). Pt c/o headache, finger and knee pain.  BP 182/90 HR 100 RR 18 Sp02 99 RA CBG 142  20ga Left hand.

## 2023-02-26 NOTE — Discharge Instructions (Signed)
Take Tylenol as needed for pain I have prescribed Flexeril for muscle spasms.  As we discussed you do not have any fractures on your scans.  Expect the face to become black and blue tomorrow.  Please apply ice to help with swelling  See your doctor for follow-up  Return to ER if you have severe headache or vomiting or back pain

## 2023-02-26 NOTE — ED Provider Notes (Signed)
[place] EMERGENCY DEPARTMENT AT [facility] Provider Note   CSN: [id number] Arrival date & time: 02/26/23  1539     History  No chief complaint on file.   MAYGAN KOELLER is a 77 y.o. female hx of hypertension, osteoporosis here presenting with fall.  Patient states that she was at a [employer] and tripped and fell and hit her face.  Patient complains of left facial pain as well as back pain.  Patient states that she may be on a blood thinner but she cannot remember.  Patient denies passing out.  No meds prior to arrival  The history is provided by the patient.       Home Medications Prior to Admission medications   Medication Sig Start Date End Date Taking? Authorizing Provider  albuterol (VENTOLIN HFA) 108 (90 Base) MCG/ACT inhaler Inhale 1-2 puffs into the lungs every 6 (six) hours as needed for wheezing or shortness of breath. 01/08/21   Particia Nearing, PA-C  aspirin EC 81 MG tablet Take 81 mg by mouth daily.    [provider]  cephALEXin (KEFLEX) 500 MG capsule Take 1 capsule (500 mg total) by mouth 2 (two) times daily. 01/02/21   Wallis Bamberg, PA-C  Diethylpropion HCl CR 75 MG TB24 Take 75 mg by mouth daily. Reported on 06/03/2015 07/17/14   [provider]  hydrochlorothiazide (HYDRODIURIL) 12.5 MG tablet Take 12.5 mg by mouth daily.  02/06/19   [provider]  lidocaine (HM LIDOCAINE PATCH) 4 % Place 1 patch onto the skin daily. 01/16/23   Jeanelle Malling, PA  losartan (COZAAR) 50 MG tablet Take 50 mg by mouth daily. 02/06/19   [provider]  omeprazole (PRILOSEC) 40 MG capsule Take 40 mg by mouth daily.  08/01/14   [provider]  oxyCODONE-acetaminophen (PERCOCET/ROXICET) 5-325 MG tablet Take 1 tablet by mouth every 6 (six) hours as needed for severe pain. 01/16/23   Jeanelle Malling, PA  rosuvastatin (CRESTOR) 5 MG tablet Take 5 mg by mouth daily.    [provider]      Allergies    Shellfish-derived products,  Codeine, Tetracyclines & related, and Vibramycin [doxycycline calcium]    Review of Systems   Review of Systems  Musculoskeletal:        L facial pain, back pain   Neurological:  Positive for headaches.  All other systems reviewed and are negative.   Physical Exam Updated Vital Signs BP (!) 190/79   Pulse 100   Temp 98.5 F (36.9 C)   Resp 18   SpO2 97%  Physical Exam Vitals and nursing note reviewed.  Constitutional:      Appearance: Normal appearance.  HENT:     Head: Normocephalic.     Mouth/Throat:     Comments: Patient has bruising of the left face and tenderness over the left zygomatic arch Eyes:     Extraocular Movements: Extraocular movements intact.     Pupils: Pupils are equal, round, and reactive to light.  Neck:     Comments: Mild para cervical tenderness Cardiovascular:     Rate and Rhythm: Normal rate and regular rhythm.     Pulses: Normal pulses.     Heart sounds: Normal heart sounds.  Pulmonary:     Effort: Pulmonary effort is normal.     Breath sounds: Normal breath sounds.  Abdominal:     General: Abdomen is flat.     Palpations: Abdomen is soft.  Musculoskeletal:  Cervical back: Normal range of motion.     Comments: + Lower lumbar tenderness  Skin:    General: Skin is warm.     Capillary Refill: Capillary refill takes less than 2 seconds.  Neurological:     General: No focal deficit present.     Mental Status: She is alert and oriented to person, place, and time.  Psychiatric:        Mood and Affect: Mood normal.        Behavior: Behavior normal.     ED Results / Procedures / Treatments   Labs (all labs ordered are listed, but only abnormal results are displayed) Labs Reviewed  CBC WITH DIFFERENTIAL/PLATELET  BASIC METABOLIC PANEL  PROTIME-INR    EKG None  Radiology No results found.  Procedures Procedures    Medications Ordered in ED Medications  acetaminophen (TYLENOL) tablet 650 mg (has no administration in time  range)    ED Course/ Medical Decision Making/ A&P                                 Medical Decision Making RAJAH LAMBA is a 77 y.o. female here presenting with fall.  Patient had a mechanical fall at hit her face.  Will get CT head neck and face and CT lumbar spine.   5:39 PM I reviewed patient's labs and independently interpreted imaging studies.  Labs unremarkable.  CT head and cervical spine and face showed no fracture.  Patient had a contusion of the left cheek.  CT lumbar spine unremarkable.  Stable for discharge.  Problems Addressed: Contusion of face, initial encounter: acute illness or injury Fall, initial encounter: acute illness or injury  Amount and/or Complexity of Data Reviewed Labs: ordered. Decision-making details documented in ED Course. Radiology: ordered and independent interpretation performed. Decision-making details documented in ED Course.  Risk OTC drugs. Prescription drug management.    Final Clinical Impression(s) / ED Diagnoses Final diagnoses:  None    Rx / DC Orders ED Discharge Orders     None         Charlynne Pander, MD 02/26/23 1740

## 2023-02-28 ENCOUNTER — Ambulatory Visit: Payer: Medicare PPO | Admitting: Physician Assistant

## 2023-02-28 ENCOUNTER — Ambulatory Visit: Payer: Medicare PPO | Admitting: Orthopaedic Surgery

## 2023-02-28 DIAGNOSIS — M25561 Pain in right knee: Secondary | ICD-10-CM

## 2023-02-28 DIAGNOSIS — M25562 Pain in left knee: Secondary | ICD-10-CM | POA: Diagnosis not present

## 2023-02-28 DIAGNOSIS — G8929 Other chronic pain: Secondary | ICD-10-CM

## 2023-02-28 DIAGNOSIS — Z96659 Presence of unspecified artificial knee joint: Secondary | ICD-10-CM | POA: Diagnosis not present

## 2023-02-28 MED ORDER — LIDOCAINE HCL 1 % IJ SOLN
3.0000 mL | INTRAMUSCULAR | Status: AC | PRN
Start: 2023-02-28 — End: 2023-02-28
  Administered 2023-02-28: 3 mL

## 2023-02-28 MED ORDER — METHYLPREDNISOLONE ACETATE 40 MG/ML IJ SUSP
40.0000 mg | INTRAMUSCULAR | Status: AC | PRN
Start: 2023-02-28 — End: 2023-02-28
  Administered 2023-02-28: 40 mg via INTRA_ARTICULAR

## 2023-02-28 NOTE — Progress Notes (Signed)
The patient has a history of bilateral partial knee arthroplasties that were done in [place] years ago.  We had recommended total knee replacements but she only wanted a partial knee is done.  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  She comes in time to time for steroid injections.  I have told her after today's injection she needs to wait a minimum of 6 months between injections because of the detrimental effect steroids can have on the quality of the bone in her knees as well as the implants themselves and there is definitely an infection risk.  She is recently had a fall hitting her head and face.  She was seen in the emergency room for this.  She is ambulating with a walker.  Both knees showed no effusion today or evidence of infection but global tenderness.  She says that steroids do help her.  I did place a steroid in both knees today.  She tolerated these well.  She knows that we will schedule her for an appointment in 6 months for repeat injections.  I would not recommend them earlier than this.    Procedure Note  Patient: [name]             Date of Birth: Jan 05, 1946           MRN: [id number]             Visit Date: 02/28/2023  Procedures: Visit Diagnoses:  1. Chronic pain of right knee   2. Chronic pain of left knee   3. History of partial knee replacement     Large Joint Inj: R knee on 02/28/2023 9:06 AM Indications: diagnostic evaluation and pain Details: 22 G 1.5 in needle, superolateral approach  Arthrogram: No  Medications: 3 mL lidocaine 1 %; 40 mg methylPREDNISolone acetate 40 MG/ML Outcome: tolerated well, no immediate complications Procedure, treatment alternatives, risks and benefits explained, specific risks discussed. Consent was given by the patient. Immediately prior to procedure a time out was called to verify the correct patient, procedure, equipment, support staff and site/side marked  as required. Patient was prepped and draped in the usual sterile fashion.    Large Joint Inj: L knee on 02/28/2023 9:07 AM Indications: diagnostic evaluation and pain Details: 22 G 1.5 in needle, superolateral approach  Arthrogram: No  Medications: 3 mL lidocaine 1 %; 40 mg methylPREDNISolone acetate 40 MG/ML Outcome: tolerated well, no immediate complications Procedure, treatment alternatives, risks and benefits explained, specific risks discussed. Consent was given by the patient. Immediately prior to procedure a time out was called to verify the correct patient, procedure, equipment, support staff and site/side marked as required. Patient was prepped and draped in the usual sterile fashion.

## 2023-03-18 ENCOUNTER — Emergency Department (HOSPITAL_COMMUNITY)
Admission: EM | Admit: 2023-03-18 | Discharge: 2023-03-19 | Disposition: A | Discharge status: Home / Self Care | Payer: Medicare PPO | Attending: Emergency Medicine | Admitting: Emergency Medicine

## 2023-03-18 ENCOUNTER — Encounter (HOSPITAL_COMMUNITY): Payer: Self-pay | Admitting: *Deleted

## 2023-03-18 ENCOUNTER — Other Ambulatory Visit: Payer: Self-pay

## 2023-03-18 DIAGNOSIS — Z7982 Long term (current) use of aspirin: Secondary | ICD-10-CM | POA: Diagnosis not present

## 2023-03-18 DIAGNOSIS — N3090 Cystitis, unspecified without hematuria: Secondary | ICD-10-CM | POA: Insufficient documentation

## 2023-03-18 DIAGNOSIS — R102 Pelvic and perineal pain: Secondary | ICD-10-CM | POA: Diagnosis present

## 2023-03-18 DIAGNOSIS — N3091 Cystitis, unspecified with hematuria: Secondary | ICD-10-CM

## 2023-03-18 NOTE — ED Triage Notes (Signed)
The pt is c/o vaginal  pain she is having difficulty voiding and she is voiding in small amounts  no known temp

## 2023-03-19 ENCOUNTER — Emergency Department (HOSPITAL_COMMUNITY): Payer: Medicare PPO

## 2023-03-19 DIAGNOSIS — N3090 Cystitis, unspecified without hematuria: Secondary | ICD-10-CM | POA: Diagnosis not present

## 2023-03-19 LAB — URINALYSIS, ROUTINE W REFLEX MICROSCOPIC
Bilirubin Urine: NEGATIVE
Glucose, UA: NEGATIVE mg/dL
Hgb urine dipstick: NEGATIVE
Ketones, ur: NEGATIVE mg/dL
Nitrite: NEGATIVE
Protein, ur: NEGATIVE mg/dL
Specific Gravity, Urine: 1.003 — ABNORMAL LOW (ref 1.005–1.030)
pH: 8 (ref 5.0–8.0)

## 2023-03-19 LAB — COMPREHENSIVE METABOLIC PANEL
ALT: 11 U/L (ref 0–44)
AST: 17 U/L (ref 15–41)
Albumin: 3.4 g/dL — ABNORMAL LOW (ref 3.5–5.0)
Alkaline Phosphatase: 100 U/L (ref 38–126)
Anion gap: 13 (ref 5–15)
BUN: 10 mg/dL (ref 8–23)
CO2: 27 mmol/L (ref 22–32)
Calcium: 9 mg/dL (ref 8.9–10.3)
Chloride: 100 mmol/L (ref 98–111)
Creatinine, Ser: 0.93 mg/dL (ref 0.44–1.00)
GFR, Estimated: >60 mL/min (ref >60)
Glucose, Bld: 124 mg/dL — ABNORMAL HIGH (ref 70–99)
Potassium: 3.1 mmol/L — ABNORMAL LOW (ref 3.5–5.1)
Sodium: 140 mmol/L (ref 135–145)
Total Bilirubin: 0.5 mg/dL (ref 0.3–1.2)
Total Protein: 7.4 g/dL (ref 6.5–8.1)

## 2023-03-19 LAB — CBC
HCT: 44.5 % (ref 36.0–46.0)
Hemoglobin: 13.9 g/dL (ref 12.0–15.0)
MCH: 26.8 pg (ref 26.0–34.0)
MCHC: 31.2 g/dL (ref 30.0–36.0)
MCV: 85.7 fL (ref 80.0–100.0)
Platelets: 342 10*3/uL (ref 150–400)
RBC: 5.19 MIL/uL — ABNORMAL HIGH (ref 3.87–5.11)
RDW: 16.5 % — ABNORMAL HIGH (ref 11.5–15.5)
WBC: 10.7 10*3/uL — ABNORMAL HIGH (ref 4.0–10.5)
nRBC: 0 % (ref 0.0–0.2)

## 2023-03-19 LAB — LIPASE, BLOOD: Lipase: 26 U/L (ref 11–51)

## 2023-03-19 MED ORDER — IOHEXOL 350 MG/ML SOLN
75.0000 mL | Freq: Once | INTRAVENOUS | Status: AC | PRN
  Administered 2023-03-19: 75 mL via INTRAVENOUS

## 2023-03-19 MED ORDER — CEFUROXIME AXETIL 250 MG PO TABS: 250.0000 mg | ORAL_TABLET | Freq: Two times a day (BID) | ORAL | 0 refills | Status: DC

## 2023-03-19 MED ORDER — MORPHINE SULFATE (PF) 2 MG/ML IV SOLN
2.0000 mg | Freq: Once | INTRAVENOUS | Status: AC
  Administered 2023-03-19: 2 mg via INTRAVENOUS
  Filled 2023-03-19: qty 1

## 2023-03-19 MED ORDER — SODIUM CHLORIDE 0.9 % IV SOLN
1.0000 g | Freq: Once | INTRAVENOUS | Status: AC
  Administered 2023-03-19: 1 g via INTRAVENOUS
  Filled 2023-03-19: qty 10

## 2023-03-19 MED ORDER — ONDANSETRON HCL 4 MG/2ML IJ SOLN
4.0000 mg | Freq: Once | INTRAMUSCULAR | Status: AC
  Administered 2023-03-19: 4 mg via INTRAVENOUS
  Filled 2023-03-19: qty 2

## 2023-03-19 NOTE — ED Provider Notes (Signed)
[place] EMERGENCY DEPARTMENT AT [facility] Provider Note   CSN: [id number] Arrival date & time: 03/18/23  2233     History  Chief Complaint  Patient presents with   Vaginal Pain    [name] is a 77 y.o. female.  Patient presents to the emergency department for evaluation of bladder pain, vulvar pain with urinary frequency.  Patient reports that she has had UTIs in the past and thinks the symptoms are similar.  No fever, nausea, vomiting.       Home Medications Prior to Admission medications   Medication Sig Start Date End Date Taking? Authorizing Provider  cefUROXime (CEFTIN) 250 MG tablet Take 1 tablet (250 mg total) by mouth 2 (two) times daily with a meal. 03/19/23  Yes Lorance Pickeral, Canary Brim, MD  albuterol (VENTOLIN HFA) 108 (90 Base) MCG/ACT inhaler Inhale 1-2 puffs into the lungs every 6 (six) hours as needed for wheezing or shortness of breath. 01/08/21   Particia Nearing, PA-C  aspirin EC 81 MG tablet Take 81 mg by mouth daily.    [provider]  cyclobenzaprine (FLEXERIL) 5 MG tablet Take 1 tablet (5 mg total) by mouth 3 (three) times daily as needed for muscle spasms. 02/26/23   Charlynne Pander, MD  Diethylpropion HCl CR 75 MG TB24 Take 75 mg by mouth daily. Reported on 06/03/2015 07/17/14   [provider]  hydrochlorothiazide (HYDRODIURIL) 12.5 MG tablet Take 12.5 mg by mouth daily.  02/06/19   [provider]  lidocaine (HM LIDOCAINE PATCH) 4 % Place 1 patch onto the skin daily. 01/16/23   Jeanelle Malling, PA  losartan (COZAAR) 50 MG tablet Take 50 mg by mouth daily. 02/06/19   [provider]  omeprazole (PRILOSEC) 40 MG capsule Take 40 mg by mouth daily.  08/01/14   [provider]  oxyCODONE-acetaminophen (PERCOCET/ROXICET) 5-325 MG tablet Take 1 tablet by mouth every 6 (six) hours as needed for severe pain. 01/16/23   Jeanelle Malling, PA  rosuvastatin (CRESTOR) 5 MG tablet Take 5 mg by mouth daily.    [provider]      Allergies    Shellfish-derived products, Codeine, Tetracyclines & related, and Vibramycin [doxycycline calcium]    Review of Systems   Review of Systems  Physical Exam Updated Vital Signs BP (!) 167/87   Pulse 88   Temp 98.7 F (37.1 C) (Oral)   Resp (!) 22   Ht 5\' 4"  (1.626 m)   Wt 90.7 kg   SpO2 96%   BMI 34.32 kg/m  Physical Exam Vitals and nursing note reviewed. Exam conducted with a chaperone present.  Constitutional:      General: She is not in acute distress.    Appearance: She is well-developed.  HENT:     Head: Normocephalic and atraumatic.     Mouth/Throat:     Mouth: Mucous membranes are moist.  Eyes:     General: Vision grossly intact. Gaze aligned appropriately.     Extraocular Movements: Extraocular movements intact.     Conjunctiva/sclera: Conjunctivae normal.  Cardiovascular:     Rate and Rhythm: Normal rate and regular rhythm.     Pulses: Normal pulses.     Heart sounds: Normal heart sounds, S1 normal and S2 normal. No murmur heard.    No friction rub. No gallop.  Pulmonary:     Effort: Pulmonary effort is normal. No respiratory distress.     Breath sounds: Normal breath sounds.  Abdominal:  General: Bowel sounds are normal.     Palpations: Abdomen is soft.     Tenderness: There is no abdominal tenderness. There is no right CVA tenderness, left CVA tenderness, guarding or rebound.     Hernia: No hernia is present.  Genitourinary:    General: Normal vulva.     Labia:        Right: No rash, lesion or injury.        Left: No rash, lesion or injury.      Vagina: Normal. No bleeding.     Cervix: Normal.  Musculoskeletal:        General: No swelling.     Cervical back: Full passive range of motion without pain, normal range of motion and neck supple. No spinous process tenderness or muscular tenderness. Normal range of motion.     Right lower leg: No edema.     Left lower leg: No edema.  Skin:    General: Skin is warm and  dry.     Capillary Refill: Capillary refill takes less than 2 seconds.     Findings: No ecchymosis, erythema, rash or wound.  Neurological:     General: No focal deficit present.     Mental Status: She is alert and oriented to person, place, and time.     GCS: GCS eye subscore is 4. GCS verbal subscore is 5. GCS motor subscore is 6.     Cranial Nerves: Cranial nerves 2-12 are intact.     Sensory: Sensation is intact.     Motor: Motor function is intact.     Coordination: Coordination is intact.  Psychiatric:        Attention and Perception: Attention normal.        Mood and Affect: Mood normal.        Speech: Speech normal.        Behavior: Behavior normal.     ED Results / Procedures / Treatments   Labs (all labs ordered are listed, but only abnormal results are displayed) Labs Reviewed  COMPREHENSIVE METABOLIC PANEL - Abnormal; Notable for the following components:      Result Value   Potassium 3.1 (*)    Glucose, Bld 124 (*)    Albumin 3.4 (*)    All other components within normal limits  CBC - Abnormal; Notable for the following components:   WBC 10.7 (*)    RBC 5.19 (*)    RDW 16.5 (*)    All other components within normal limits  URINALYSIS, ROUTINE W REFLEX MICROSCOPIC - Abnormal; Notable for the following components:   Color, Urine STRAW (*)    Specific Gravity, Urine 1.003 (*)    Leukocytes,Ua MODERATE (*)    Bacteria, UA RARE (*)    All other components within normal limits  URINE CULTURE  LIPASE, BLOOD    EKG None  Radiology CT ABDOMEN PELVIS W CONTRAST  Result Date: 03/19/2023 CLINICAL DATA:  Vaginal pain and difficulty voiding. EXAM: CT ABDOMEN AND PELVIS WITH CONTRAST TECHNIQUE: Multidetector CT imaging of the abdomen and pelvis was performed using the standard protocol following bolus administration of intravenous contrast. RADIATION DOSE REDUCTION: This exam was performed according to the departmental dose-optimization program which includes automated  exposure control, adjustment of the mA and/or kV according to patient size and/or use of iterative reconstruction technique. CONTRAST:  75mL OMNIPAQUE IOHEXOL 350 MG/ML SOLN COMPARISON:  Limited comparison is available with CTA chest 03/01/2019 FINDINGS: Lower chest: Mild posterior atelectasis in the lower lobes.  Lung bases are clear of infiltrates. Previously there were patchy dense airspace infiltrates throughout the lung bases, no longer seen. There are trace coronary artery calcifications. The cardiac size is normal. There is no pericardial effusion. Hepatobiliary: The liver is 19 cm length slightly steatotic with improvement in the steatosis since the prior study. There is a 1.1 cm subcapsular cyst anteriorly in hepatic segment 5, unchanged. There is no mass enhancement. There is a solitary 9 mm stone in the neck of the gallbladder but no wall thickening or biliary dilatation. Pancreas: Mildly atrophic.  Otherwise unremarkable. Spleen: Again noted is a 1.2 cm hypodensity in the midsubstance of the spleen, unchanged. This measures 25 Hounsfield units. Most likely is either a proteinaceous cyst or hemangioma. Adrenals/Urinary Tract: Mild stable nodular thickening both adrenal glands. Below the plane of the prior study, there is a 1 cm hypodensity in the lower pole of the right kidney, Hounsfield density is 41 and is indeterminate by density. Follow-up ultrasound or MRI recommended. Both kidneys are otherwise homogeneous. There is no urinary stone or obstruction. The bladder is contracted and collapsed follow-up could be thickened. There are slight perivesical stranding changes concerning for cystitis. Also noted at the level of the symphysis pubis, there is a periurethral semilunar or "saddle bag" shaped rim enhancing fluid collection to the left and posteriorly, displacing the urethra to the right measuring 2.6 x 1.4 x 2.7 cm and -3.2 Hounsfield units. 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 Infectious complication is not excluded but the fluid is quite low in density. This can be associated with female dysuria symptoms. On CT there could not be established a definitive connection to the urethra. This is usually evaluated for with MRI. Stomach/Bowel: Unremarkable contracted stomach. Unremarkable unopacified small bowel. An appendix is not seen in this patient. There is scattered diverticular disease in the ascending and transverse colon. There is more advanced diverticulosis in the distal descending and proximal sigmoid. There is a short-segment of the proximal sigmoid showing inflammatory reaction superior and medial to the diseased segment on axial images 55-58 of series 3 and coronal reconstruction images 96-101. Findings consistent with a mild acute diverticulitis. No abscess or free air seen. Vascular/Lymphatic: Aortic atherosclerosis. No enlarged abdominal or pelvic lymph nodes. Reproductive: Status post hysterectomy. No adnexal masses. Other: No abdominal wall hernia or abnormality. No abdominopelvic ascites. Musculoskeletal: Osteopenia with degenerative changes of the spine. Pelvic enthesopathy. No acute or other significant osseous findings. IMPRESSION: 1. 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 2. Infectious complication is not excluded but the fluid is quite low in density. 3. This can be associated with female dysuria symptoms. MRI recommended for further evaluation. 4. Contracted bladder with slight perivesical stranding changes concerning for cystitis. 5. Diverticulosis with mild acute diverticulitis in the proximal sigmoid. No abscess or free air is seen. 6. 1 cm indeterminate hypodensity in the lower pole of the right  kidney. Follow-up ultrasound or MRI recommended. 7. Cholelithiasis. 8. Aortic and coronary artery atherosclerosis. 9. Osteopenia and degenerative change. Aortic Atherosclerosis (ICD10-I70.0). Electronically Signed   By: Almira Bar M.D.   On: 03/19/2023 07:08    Procedures Procedures    Medications Ordered in ED Medications  cefTRIAXone (ROCEPHIN) 1 g in sodium chloride  0.9 % 100 mL IVPB (has no administration in time range)  morphine (PF) 2 MG/ML injection 2 mg (2 mg Intravenous Given 03/19/23 0521)  ondansetron (ZOFRAN) injection 4 mg (4 mg Intravenous Given 03/19/23 0514)  iohexol (OMNIPAQUE) 350 MG/ML injection 75 mL (75 mLs Intravenous Contrast Given 03/19/23 6962)    ED Course/ Medical Decision Making/ A&P                                 Medical Decision Making Amount and/or Complexity of Data Reviewed Labs: ordered. Radiology: ordered.  Risk Prescription drug management.   Differential diagnosis considered includes, but not limited to: Ureterolithiasis; pyelonephritis she does; Hemorrhagic cystitis; vaginal bleeding  Patient with urinary tract infection symptoms but urinalysis is equivocal.  Blood work unremarkable.  CT scan with evidence of cystitis as well as possible urethral diverticulum.  I doubt this is acutely causing problems, can be worked up as an outpatient.  Will refer to urology.  While here in the ED patient now passing gross blood in her urine.  Speculum vaginal exam reveals no evidence of vaginal bleeding.  Will culture urine, initiate antibiotic coverage.        Final Clinical Impression(s) / ED Diagnoses Final diagnoses:  Hemorrhagic cystitis    Rx / DC Orders ED Discharge Orders          Ordered    cefUROXime (CEFTIN) 250 MG tablet  2 times daily with meals        03/19/23 0741              Gilda Crease, MD 03/19/23 [phone]

## 2023-03-20 LAB — URINE CULTURE

## 2023-04-05 ENCOUNTER — Ambulatory Visit (INDEPENDENT_AMBULATORY_CARE_PROVIDER_SITE_OTHER): Payer: Medicare PPO | Admitting: Diagnostic Neuroimaging

## 2023-04-05 ENCOUNTER — Encounter: Payer: Self-pay | Admitting: Diagnostic Neuroimaging

## 2023-04-05 ENCOUNTER — Ambulatory Visit: Payer: Medicare PPO | Admitting: Diagnostic Neuroimaging

## 2023-04-05 VITALS — BP 156/92 | HR 95 | Ht 64.0 in | Wt 212.0 lb

## 2023-04-05 DIAGNOSIS — R413 Other amnesia: Secondary | ICD-10-CM | POA: Diagnosis not present

## 2023-04-05 NOTE — Patient Instructions (Signed)
  MEMORY LOSS (since ~2019; short term memory, repeating, navigation; mild changes in ADLs; MMSE 24/30; suspect mild dementia) - check MRI brain, ATN, B12 - continue donepezil - consider memantine 10mg  at bedtime; increase to twice a day after 1-2 weeks - try to stay active physically and get some exercise (at least 15-30 minutes per day) - eat a nutritious diet with lean protein, plants / vegetables, whole grains; avoid ultra-processed foods - increase social activities, brain stimulation, games, puzzles, hobbies, crafts, arts, music; try new activities; keep it fun! - aim for at least 7-8 hours sleep per night (or more) - avoid smoking and alcohol - caution with medications, finances, driving - safety / supervision issues reviewed - caregiver resources provided (including [email or web address])

## 2023-04-05 NOTE — Progress Notes (Signed)
[facility]  PATIENT: [name] DOB: 09/03/1945  REFERRING CLINICIAN: Corine Shelter, MD HISTORY FROM: patient  REASON FOR VISIT: new consult   HISTORICAL  CHIEF COMPLAINT:  Chief Complaint  Patient presents with   New Patient (Initial Visit)    Patient in room #7 with her son. Patient states she here today to discuss her memory issues. Patient believe after Covid she was having issues and falling a few times and she think that could had an effect on her memory.    HISTORY OF PRESENT ILLNESS:   77 year old female here for evaluation of dementia.  Since 2019 patient has had gradual onset progressive short-term memory loss, repetitive speech and language, getting lost driving, having more difficulty managing complex tasks at home.  Also has had some issues with balance and gait difficulty.  Sister had dementia.  Patient now getting support from 1 son that moved here from [place] and another son who works in [place] but lives in [place].   REVIEW OF SYSTEMS: Full 14 system review of systems performed and negative with exception of: as per HPI.  ALLERGIES: Allergies  Allergen Reactions   Shellfish-Derived Products Other (See Comments)    Shrimp and lobster--severe swelling/itching   Carisoprodol-Aspirin-Codeine Other (See Comments)   Codeine Nausea And Vomiting   Tetracyclines & Related Nausea And Vomiting   Vibramycin [Doxycycline Calcium] Nausea And Vomiting    HOME MEDICATIONS: Outpatient Medications Prior to Visit  Medication Sig Dispense Refill   aspirin EC 81 MG tablet Take 81 mg by mouth daily.     celecoxib (CELEBREX) 100 MG capsule Take 100 mg by mouth daily.     donepezil (ARICEPT) 10 MG tablet Take 10 mg by mouth daily.     losartan (COZAAR) 50 MG tablet Take 50 mg by mouth daily.     omeprazole (PRILOSEC) 40 MG capsule Take 40 mg by mouth daily.   0   rosuvastatin (CRESTOR) 5 MG tablet Take 5 mg by mouth daily.     albuterol  (VENTOLIN HFA) 108 (90 Base) MCG/ACT inhaler Inhale 1-2 puffs into the lungs every 6 (six) hours as needed for wheezing or shortness of breath. (Patient not taking: Reported on 04/05/2023) 18 g 0   cefUROXime (CEFTIN) 250 MG tablet Take 1 tablet (250 mg total) by mouth 2 (two) times daily with a meal. (Patient not taking: Reported on 04/05/2023) 20 tablet 0   cyclobenzaprine (FLEXERIL) 5 MG tablet Take 1 tablet (5 mg total) by mouth 3 (three) times daily as needed for muscle spasms. (Patient not taking: Reported on 04/05/2023) 10 tablet 0   diclofenac Sodium (VOLTAREN) 1 % GEL Apply 1 g topically 2 (two) times daily. (Patient not taking: Reported on 04/05/2023)     Diethylpropion HCl CR 75 MG TB24 Take 75 mg by mouth daily. Reported on 06/03/2015 (Patient not taking: Reported on 04/05/2023)  0   hydrochlorothiazide (HYDRODIURIL) 12.5 MG tablet Take 12.5 mg by mouth daily.  (Patient not taking: Reported on 04/05/2023)     lidocaine (HM LIDOCAINE PATCH) 4 % Place 1 patch onto the skin daily. (Patient not taking: Reported on 04/05/2023) 5 patch 0   oxyCODONE-acetaminophen (PERCOCET/ROXICET) 5-325 MG tablet Take 1 tablet by mouth every 6 (six) hours as needed for severe pain. (Patient not taking: Reported on 04/05/2023) 8 tablet 0   No facility-administered medications prior to visit.    PAST MEDICAL HISTORY: Past Medical History:  Diagnosis Date   Hypertension     PAST  SURGICAL HISTORY: Past Surgical History:  Procedure Laterality Date   BREAST EXCISIONAL BIOPSY Bilateral    MEDIAL PARTIAL KNEE REPLACEMENT Bilateral 06/07/2012    FAMILY HISTORY: No family history on file.  SOCIAL HISTORY: Social History   Socioeconomic History   Marital status: Widowed    Spouse name: Not on file   Number of children: Not on file   Years of education: Not on file   Highest education level: Not on file  Occupational History   Not on file  Tobacco Use   Smoking status: Never   Smokeless tobacco:  Never  Substance and Sexual Activity   Alcohol use: No    Alcohol/week: 0.0 standard drinks of alcohol   Drug use: No   Sexual activity: Never    Birth control/protection: Surgical    Comment: hysterectomy  Other Topics Concern   Not on file  Social History Narrative   Not on file   Social Determinants of Health   Financial Resource Strain: Not on file  Food Insecurity: Not on file  Transportation Needs: Not on file  Physical Activity: Not on file  Stress: Not on file  Social Connections: Not on file  Intimate Partner Violence: Not on file     PHYSICAL EXAM  GENERAL EXAM/CONSTITUTIONAL: Vitals:  Vitals:   04/05/23 1357  BP: (!) 156/92  Pulse: 95  Weight: 212 lb (96.2 kg)  Height: 5\' 4"  (1.626 m)   Body mass index is 36.39 kg/m. Wt Readings from Last 3 Encounters:  04/05/23 212 lb (96.2 kg)  03/18/23 199 lb 15.3 oz (90.7 kg)  01/16/23 200 lb (90.7 kg)   Patient is in no distress; well developed, nourished and groomed; neck is supple  CARDIOVASCULAR: Examination of carotid arteries is normal; no carotid bruits Regular rate and rhythm, no murmurs Examination of peripheral vascular system by observation and palpation is normal  EYES: Ophthalmoscopic exam of optic discs and posterior segments is normal; no papilledema or hemorrhages No results found.  MUSCULOSKELETAL: Gait, strength, tone, movements noted in Neurologic exam below  NEUROLOGIC: MENTAL STATUS:     04/05/2023    2:09 PM  MMSE - Mini Mental State Exam  Orientation to time 4  Orientation to Place 5  Registration 3  Attention/ Calculation 1  Recall 3  Language- name 2 objects 2  Language- repeat 1  Language- follow 3 step command 3  Language- read & follow direction 1  Write a sentence 1  Copy design 0  Total score 24   awake, alert, oriented to person, place and time recent and remote memory intact normal attention and concentration language fluent, comprehension intact, naming  intact fund of knowledge appropriate  CRANIAL NERVE:  2nd - no papilledema on fundoscopic exam 2nd, 3rd, 4th, 6th - pupils equal and reactive to light, visual fields full to confrontation, extraocular muscles intact, no nystagmus 5th - facial sensation symmetric 7th - facial strength symmetric 8th - hearing intact 9th - palate elevates symmetrically, uvula midline 11th - shoulder shrug symmetric 12th - tongue protrusion midline  MOTOR:  normal bulk and tone, full strength in the BUE, BLE  SENSORY:  normal and symmetric to light touch, temperature, vibration  COORDINATION:  finger-nose-finger, fine finger movements normal  REFLEXES:  deep tendon reflexes TRACE and symmetric  GAIT/STATION:  narrow based gait; USING WALKER     DIAGNOSTIC DATA (LABS, IMAGING, TESTING) - I reviewed patient records, labs, notes, testing and imaging myself where available.  Lab Results  Component Value  Date   WBC 10.7 (H) 03/18/2023   HGB 13.9 03/18/2023   HCT 44.5 03/18/2023   MCV 85.7 03/18/2023   PLT 342 03/18/2023      Component Value Date/Time   NA 140 03/18/2023 2329   K 3.1 (L) 03/18/2023 2329   CL 100 03/18/2023 2329   CO2 27 03/18/2023 2329   GLUCOSE 124 (H) 03/18/2023 2329   BUN 10 03/18/2023 2329   CREATININE 0.93 03/18/2023 2329   CALCIUM 9.0 03/18/2023 2329   PROT 7.4 03/18/2023 2329   ALBUMIN 3.4 (L) 03/18/2023 2329   AST 17 03/18/2023 2329   ALT 11 03/18/2023 2329   ALKPHOS 100 03/18/2023 2329   BILITOT 0.5 03/18/2023 2329   GFRNONAA >60 03/18/2023 2329   GFRAA >60 03/05/2019 0215   Lab Results  Component Value Date   TRIG 107 02/25/2019   No results found for: "HGBA1C" No results found for: "VITAMINB12" No results found for: "TSH"   02/26/23 CT head [I reviewed images myself and agree with interpretation. -VRP]  1. No acute intracranial pathology. Advanced small-vessel white matter disease. 2. No displaced fractures or dislocations of the facial  bones. 3. Soft tissue contusion of the left cheek. 4. No fracture or static subluxation of the cervical spine. 5. Moderate disc space height loss and osteophytosis of the lower cervical levels, worst at C6-C7.   ASSESSMENT AND PLAN  77 y.o. year old female here with:  Dx:  1. Memory loss     PLAN:  MEMORY LOSS (since ~2019; short term memory, repeating, navigation; mild changes in ADLs; MMSE 24/30; suspect mild dementia) - check MRI brain, ATN, B12 - continue donepezil - consider memantine 10mg  at bedtime; increase to twice a day after 1-2 weeks - try to stay active physically and get some exercise (at least 15-30 minutes per day) - eat a nutritious diet with lean protein, plants / vegetables, whole grains; avoid ultra-processed foods - increase social activities, brain stimulation, games, puzzles, hobbies, crafts, arts, music; try new activities; keep it fun! - aim for at least 7-8 hours sleep per night (or more) - avoid smoking and alcohol - caution with medications, finances, driving - safety / supervision issues reviewed - caregiver resources provided (including [email or web address])  Orders Placed This Encounter  Procedures   MR BRAIN W WO CONTRAST   Vitamin B12   ATN PROFILE   Return in about 6 months (around 10/03/2023) for MyChart visit (15 min).    Suanne Marker, MD 04/05/2023, 3:09 PM Certified in Neurology, Neurophysiology and Neuroimaging  [facility] [address], [address] [place], [place] [address] [phone]

## 2023-04-08 LAB — ATN PROFILE
A -- Beta-amyloid 42/40 Ratio: 0.129 (ref >0.102)
Beta-amyloid 40: 174.43 pg/mL
Beta-amyloid 42: 22.54 pg/mL
N -- NfL, Plasma: 2.8 pg/mL (ref 0.00–7.64)
T -- p-tau181: 0.66 pg/mL (ref 0.00–0.97)

## 2023-04-08 LAB — VITAMIN B12: Vitamin B-12: 249 pg/mL (ref 232–1245)

## 2023-04-21 ENCOUNTER — Telehealth: Payer: Self-pay

## 2023-04-21 NOTE — Telephone Encounter (Signed)
Called patient and informed her ATN panel was normal, with no evidence of Alzheimer's disease. B12 is low normal and Dr. Marjory Lies recommend B12 1000 mcg daily oral supplement. Pt verbalized understanding. Pt had no questions at this time but was encouraged to call back if questions arise.

## 2023-04-21 NOTE — Telephone Encounter (Signed)
-----   Message from Glenford Bayley [facility] sent at 04/20/2023  4:54 PM EST ----- Normal ATN panel (no evidence of Alzheimer's disease).   B12 is low normal.  Recommend B12 1000 mcg daily oral supplement.. Please call patient. -VRP

## 2023-05-25 ENCOUNTER — Other Ambulatory Visit: Payer: Medicare PPO

## 2023-06-09 ENCOUNTER — Ambulatory Visit: Payer: Medicare PPO | Admitting: Physician Assistant

## 2023-06-10 ENCOUNTER — Telehealth: Payer: Self-pay | Admitting: Radiology

## 2023-06-10 NOTE — Telephone Encounter (Signed)
Got her an appt with [facility] for injections Monday

## 2023-06-10 NOTE — Telephone Encounter (Signed)
Patient left voicemail on triage line requesting medication for bilateral knee pain, left>right.  Please advise.  CB [phone]

## 2023-06-13 ENCOUNTER — Ambulatory Visit (INDEPENDENT_AMBULATORY_CARE_PROVIDER_SITE_OTHER): Payer: Medicare PPO | Admitting: Physician Assistant

## 2023-06-13 ENCOUNTER — Encounter: Payer: Self-pay | Admitting: Physician Assistant

## 2023-06-13 DIAGNOSIS — M1712 Unilateral primary osteoarthritis, left knee: Secondary | ICD-10-CM

## 2023-06-13 DIAGNOSIS — M1711 Unilateral primary osteoarthritis, right knee: Secondary | ICD-10-CM

## 2023-06-13 NOTE — Progress Notes (Signed)
Office Visit Note   Patient: [name]           Date of Birth: December 20, 1945           MRN: [id number] Visit Date: 06/13/2023              Requested by: Corine Shelter, MD [address] [place],  [place] [address] PCP: Corine Shelter, MD  No chief complaint on file.     HPI: [name] is a pleasant 78 year old woman who is a patient of Dr. Eliberto Ivory.  She has a history of bilateral Uni knee replacements done elsewhere.  Unfortunately she has significant tricompartmental arthritis.  She periodically gets steroid injections.  Last steroid injections were 3 months ago.  Both [name] and Dr. Magnus Ivan have emphasized to her that she cannot get these injections any sooner than 6 months.  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  Plan: Explained to the patient that she is about 3 months early for injections.  Have recommended that she make an appointment with either Gill or Dr. Magnus Ivan in mid April to have her knees injected.  Follow-Up Instructions: 3 months  Ortho Exam  Patient is alert, oriented, no adenopathy, well-dressed, normal affect, normal respiratory effort. Bilateral knees has antalgic gait no evidence of infection compartments are soft  Imaging: No results found. No images are attached to the encounter.  Labs: Lab Results  Component Value Date   CRP 2.0 (H) 03/05/2019   CRP 6.3 (H) 03/03/2019   CRP 10.5 (H) 03/02/2019   REPTSTATUS 03/20/2023 FINAL 03/19/2023   CULT MULTIPLE SPECIES PRESENT, SUGGEST RECOLLECTION (A) 03/19/2023   LABORGA NO GROWTH 06/03/2015     Lab Results  Component Value Date   ALBUMIN 3.4 (L) 03/18/2023   ALBUMIN 2.1 (L) 03/03/2019   ALBUMIN 2.1 (L) 03/02/2019    No results found for: "MG" No results found for: "VD25OH"  No results found for: "PREALBUMIN"    Latest Ref Rng & Units 03/18/2023   11:29 PM 02/26/2023     4:08 PM 03/05/2019    2:15 AM  CBC EXTENDED  WBC 4.0 - 10.5 K/uL 10.7  10.0  13.6   RBC 3.87 - 5.11 MIL/uL 5.19  5.42  4.36   Hemoglobin 12.0 - 15.0 g/dL 41.3  24.4  01.0   HCT 36.0 - 46.0 % 44.5  45.9  37.3   Platelets 150 - 400 K/uL 342  306  485   NEUT# 1.7 - 7.7 K/uL  7.9    Lymph# 0.7 - 4.0 K/uL  1.3       There is no height or weight on file to calculate BMI.  Orders:  No orders of the defined types were placed in this encounter.  No orders of the defined types were placed in this encounter.    Procedures: No procedures performed  Clinical Data: No additional findings.  ROS:  All other systems negative, except as noted in the HPI. Review of Systems  Objective: Vital Signs: There were no vitals taken for this visit.  Specialty Comments:  No specialty comments available.  PMFS History: Patient Active Problem List   Diagnosis Date Noted   Pneumonia due to COVID-19 virus 02/26/2019   Respiratory failure (HCC)/ hypoxic 02/26/2019   Essential hypertension 02/26/2019   Dyslipidemia 02/26/2019   GERD (gastroesophageal reflux disease) 02/26/2019   Bunion 08/21/2014   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   Onychomycosis  02/28/2013   Pain, foot 02/28/2013   Past Medical History:  Diagnosis Date   Hypertension     No family history on file.  Past Surgical History:  Procedure Laterality Date   BREAST EXCISIONAL BIOPSY Bilateral    MEDIAL PARTIAL KNEE REPLACEMENT Bilateral 06/07/2012   Social History   Occupational History   Not on file  Tobacco Use   Smoking status: Never   Smokeless tobacco: Never  Substance and Sexual Activity   Alcohol use: No    Alcohol/week: 0.0 standard drinks of alcohol   Drug use: No   Sexual activity: Never    Birth control/protection: Surgical    Comment: hysterectomy

## 2023-07-04 ENCOUNTER — Ambulatory Visit (INDEPENDENT_AMBULATORY_CARE_PROVIDER_SITE_OTHER): Payer: Medicare PPO | Admitting: Physician Assistant

## 2023-07-04 ENCOUNTER — Encounter: Payer: Self-pay | Admitting: Physician Assistant

## 2023-07-04 DIAGNOSIS — M1711 Unilateral primary osteoarthritis, right knee: Secondary | ICD-10-CM

## 2023-07-04 DIAGNOSIS — M1712 Unilateral primary osteoarthritis, left knee: Secondary | ICD-10-CM

## 2023-07-04 NOTE — Progress Notes (Signed)
HPI Ms. fingers comes in today requesting cortisone injections bilateral knees.  Last injections were given 02/28/2023.  She has partial knee replacements with bilateral arthritis of the lateral compartments and patellofemoral joints.  Explained to her that the knee is still too early for her to have the injections.  Will see her back after March 14 and she did have injections of both knees.  Interim recommend Voltaren gel 4 g 4 times daily.  No charge for today's office visit.

## 2023-07-25 ENCOUNTER — Ambulatory Visit: Payer: POS | Admitting: Diagnostic Neuroimaging

## 2023-08-11 ENCOUNTER — Ambulatory Visit (INDEPENDENT_AMBULATORY_CARE_PROVIDER_SITE_OTHER): Admitting: Physician Assistant

## 2023-08-11 ENCOUNTER — Encounter: Payer: Self-pay | Admitting: Physician Assistant

## 2023-08-11 DIAGNOSIS — M1712 Unilateral primary osteoarthritis, left knee: Secondary | ICD-10-CM

## 2023-08-11 DIAGNOSIS — M170 Bilateral primary osteoarthritis of knee: Secondary | ICD-10-CM | POA: Diagnosis not present

## 2023-08-11 DIAGNOSIS — M1711 Unilateral primary osteoarthritis, right knee: Secondary | ICD-10-CM

## 2023-08-11 MED ORDER — LIDOCAINE HCL 1 % IJ SOLN
3.0000 mL | INTRAMUSCULAR | Status: AC | PRN
  Administered 2023-08-11: 3 mL

## 2023-08-11 MED ORDER — METHYLPREDNISOLONE ACETATE 40 MG/ML IJ SUSP
40.0000 mg | INTRAMUSCULAR | Status: AC | PRN
  Administered 2023-08-11: 40 mg via INTRA_ARTICULAR

## 2023-08-11 NOTE — Progress Notes (Signed)
   Procedure Note  Patient: [name]             Date of Birth: 24-Nov-1945           MRN: [id number]             Visit Date: 08/11/2023 HPI: [name] comes in today requesting bilateral knee injections.  She is someone who has history of bilateral partial knee arthroplasties that were done in [place].  She has been recommended to have total knee replacement but only wants partial knees done.  She has now severe arthritis involving the lateral compartment of both knees and the patellofemoral joints of both knees.  She knows to wait at least 6 months between injections.  States the injections have been beneficial.  She denies any new injury to either knee.  Denies any fevers chills. Procedures: Visit Diagnoses:  1. Unilateral primary osteoarthritis, left knee   2. Unilateral primary osteoarthritis, right knee     Large Joint Inj: bilateral knee on 08/11/2023 5:41 PM Indications: pain Details: 22 G 1.5 in needle, anterolateral approach  Arthrogram: No  Medications (Right): 3 mL lidocaine 1 %; 40 mg methylPREDNISolone acetate 40 MG/ML Medications (Left): 3 mL lidocaine 1 %; 40 mg methylPREDNISolone acetate 40 MG/ML Outcome: tolerated well, no immediate complications Procedure, treatment alternatives, risks and benefits explained, specific risks discussed. Consent was given by the patient. Immediately prior to procedure a time out was called to verify the correct patient, procedure, equipment, support staff and site/side marked as required. Patient was prepped and draped in the usual sterile fashion.     Plan: Follow-up as needed she knows to wait at least 6 months between injections.

## 2023-08-29 ENCOUNTER — Ambulatory Visit: Payer: Medicare PPO | Admitting: Orthopaedic Surgery

## 2023-09-19 ENCOUNTER — Telehealth: Payer: POS | Admitting: Diagnostic Neuroimaging

## 2023-10-03 ENCOUNTER — Telehealth: Payer: POS | Admitting: Diagnostic Neuroimaging

## 2023-10-24 ENCOUNTER — Telehealth: Payer: Self-pay | Admitting: Physician Assistant

## 2023-10-24 NOTE — Telephone Encounter (Signed)
 Patient called. Would like gel injections in her L knee.

## 2023-10-27 NOTE — Telephone Encounter (Signed)
 VOB submitted for Monovisc, left knee

## 2023-11-04 ENCOUNTER — Other Ambulatory Visit: Payer: Self-pay

## 2023-11-04 DIAGNOSIS — M1712 Unilateral primary osteoarthritis, left knee: Secondary | ICD-10-CM

## 2023-11-07 ENCOUNTER — Ambulatory Visit: Admitting: Physician Assistant

## 2024-02-26 ENCOUNTER — Ambulatory Visit

## 2024-02-26 ENCOUNTER — Ambulatory Visit: Admission: EM | Admit: 2024-02-26 | Discharge: 2024-02-26 | Disposition: A | Discharge status: Home / Self Care

## 2024-02-26 DIAGNOSIS — M25562 Pain in left knee: Secondary | ICD-10-CM

## 2024-02-26 MED ORDER — KETOROLAC TROMETHAMINE 15 MG/ML IJ SOLN
15.0000 mg | Freq: Once | INTRAMUSCULAR | Status: AC
  Administered 2024-02-26: 15 mg via INTRAMUSCULAR

## 2024-02-26 MED ORDER — KETOROLAC TROMETHAMINE 30 MG/ML IJ SOLN: 15.0000 mg | Freq: Once | INTRAMUSCULAR | Status: DC

## 2024-02-26 NOTE — ED Provider Notes (Signed)
 [facility]    CSN: [id number] Arrival date & time: 02/26/24  1137      History   Chief Complaint Chief Complaint  Patient presents with   Fall    HPI [name] is a 78 y.o. female.   78 year old female presents urgent care with complaints of left knee pain and left shoulder pain after a fall at home today.  She reports she was going down the hall with her charger and fell hitting the wall and then the floor.  She did not lose consciousness.  She reports that her main complaint is that her left knee is very painful.  It is swollen as well.  She is able to walk on it although gingerly.  She has had a partial knee replacement done on both knees.  She reports some pain in the shoulder but is able to move her shoulder through range of motion although not quite as high up as usual.  She denies any numbness or tingling.   Fall Pertinent negatives include no chest pain, no abdominal pain and no shortness of breath.    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      Home Medications    Prior to Admission medications   Medication Sig Start Date End Date Taking? Authorizing Provider  losartan (COZAAR) 100 MG  tablet Take 100 mg by mouth daily. 04/25/12  Yes [provider]  losartan-hydrochlorothiazide (HYZAAR) 50-12.5 MG tablet Take 1 tablet by mouth daily. 02/09/24  Yes [provider]  prednisoLONE acetate (PRED FORTE) 1 % ophthalmic suspension INSTILL 1 DROP INTO RIGHT EYE FOUR TIMES DAILY 09/22/19  Yes [provider]  albuterol  (VENTOLIN  HFA) 108 (90 Base) MCG/ACT inhaler Inhale 1-2 puffs into the lungs every 6 (six) hours as needed for wheezing or shortness of breath. 01/08/21   Stuart Vernell Norris, PA-C  aspirin  EC 81 MG tablet Take 81 mg by mouth daily.    [provider]  cefUROXime  (CEFTIN ) 250 MG tablet Take 1 tablet (250 mg total) by mouth 2 (two) times daily with a meal. 03/19/23   Pollina, Lonni PARAS, MD  celecoxib (CELEBREX) 100 MG capsule Take 100 mg by mouth daily. 02/04/23   [provider]  cyclobenzaprine  (FLEXERIL ) 5 MG tablet Take 1 tablet (5 mg total) by mouth 3 (three) times daily as needed for muscle spasms. 02/26/23   Patt Alm Macho, MD  diclofenac Sodium (VOLTAREN) 1 % GEL Apply 1 g topically 2 (two) times daily. 12/13/22   [provider]  Diethylpropion HCl CR 75 MG TB24 Take 75 mg by mouth daily. Reported on 06/03/2015 07/17/14   [provider]  donepezil (ARICEPT) 10 MG tablet Take 10 mg by mouth daily. 03/08/23  [provider]  hydrochlorothiazide (HYDRODIURIL) 12.5 MG tablet Take 12.5 mg by mouth daily. 02/06/19   [provider]  lidocaine  (HM LIDOCAINE  PATCH) 4 % Place 1 patch onto the skin daily. 01/16/23   Ladora Congress, PA  losartan (COZAAR) 50 MG tablet Take 50 mg by mouth daily. 02/06/19   [provider]  omeprazole (PRILOSEC) 40 MG capsule Take 40 mg by mouth daily.  08/01/14   [provider]  oxyCODONE -acetaminophen  (PERCOCET/ROXICET) 5-325 MG tablet Take 1 tablet by mouth every 6 (six) hours as needed for severe pain. 01/16/23   Ladora Congress, PA  rosuvastatin  (CRESTOR ) 5 MG  tablet Take 5 mg by mouth daily.    [provider]    Family History No family history on file.  Social History Social History   Tobacco Use   Smoking status: Never   Smokeless tobacco: Never  Vaping Use   Vaping status: Never Used  Substance Use Topics   Alcohol use: No    Alcohol/week: 0.0 standard drinks of alcohol   Drug use: No     Allergies   Shellfish protein-containing drug products, Carisoprodol-aspirin -codeine, Codeine, Doxycycline, Tetracycline, Tetracyclines & related, and Vibramycin [doxycycline calcium ]   Review of Systems Review of Systems  Constitutional:  Negative for chills and fever.  HENT:  Negative for ear pain and sore throat.   Eyes:  Negative for pain and visual disturbance.  Respiratory:  Negative for cough and shortness of breath.   Cardiovascular:  Negative for chest pain and palpitations.  Gastrointestinal:  Negative for abdominal pain and vomiting.  Genitourinary:  Negative for dysuria and hematuria.  Musculoskeletal:  Negative for arthralgias and back pain.       Left shoulder, left knee pain  Skin:  Negative for color change and rash.  Neurological:  Negative for seizures and syncope.  All other systems reviewed and are negative.    Physical Exam Triage Vital Signs ED Triage Vitals  Encounter Vitals Group     BP 02/26/24 1224 133/75     Girls Systolic BP Percentile --      Girls Diastolic BP Percentile --      Boys Systolic BP Percentile --      Boys Diastolic BP Percentile --      Pulse Rate 02/26/24 1224 94     Resp 02/26/24 1224 20     Temp 02/26/24 1224 97.8 F (36.6 C)     Temp Source 02/26/24 1224 Oral     SpO2 02/26/24 1224 95 %     Weight 02/26/24 1222 212 lb 1.3 oz (96.2 kg)     Height 02/26/24 1222 5' 4 (1.626 m)     Head Circumference --      Peak Flow --      Pain Score 02/26/24 1215 6     Pain Loc --      Pain Education --      Exclude from Growth Chart --    No data found.  Updated Vital  Signs BP 133/75 (BP Location: Left Arm)   Pulse 94   Temp 97.8 F (36.6 C) (Oral)   Resp 20   Ht 5' 4 (1.626 m)   Wt 212 lb 1.3 oz (96.2 kg)   SpO2 95%   BMI 36.40 kg/m   Visual Acuity Right Eye Distance:   Left Eye Distance:   Bilateral Distance:    Right Eye Near:   Left Eye Near:    Bilateral Near:  Physical Exam Vitals and nursing note reviewed.  Constitutional:      General: She is not in acute distress.    Appearance: She is well-developed.  HENT:     Head: Normocephalic and atraumatic.  Eyes:     Conjunctiva/sclera: Conjunctivae normal.  Cardiovascular:     Rate and Rhythm: Normal rate and regular rhythm.     Heart sounds: No murmur heard. Pulmonary:     Effort: Pulmonary effort is normal. No respiratory distress.     Breath sounds: Normal breath sounds.  Abdominal:     Palpations: Abdomen is soft.     Tenderness: There is no abdominal tenderness.  Musculoskeletal:        General: No swelling.     Left shoulder: Tenderness (Very mild anterior) present. No swelling. Decreased range of motion (Mild decrease with abduction but able to move through full range otherwise). Normal pulse.     Cervical back: Neck supple.     Left knee: Swelling and effusion present. Decreased range of motion. Tenderness present over the medial joint line and lateral joint line. No LCL laxity, MCL laxity or ACL laxity.Normal pulse.     Instability Tests: Medial McMurray test positive. Lateral McMurray test negative.  Skin:    General: Skin is warm and dry.     Capillary Refill: Capillary refill takes less than 2 seconds.  Neurological:     Mental Status: She is alert.  Psychiatric:        Mood and Affect: Mood normal.      UC Treatments / Results  Labs (all labs ordered are listed, but only abnormal results are displayed) Labs Reviewed - No data to display  EKG   Radiology DG Knee Complete 4 Views Left Result Date: 02/26/2024 CLINICAL DATA:  Fall onto left knee  EXAM: LEFT KNEE - COMPLETE 4 VIEW COMPARISON:  Left knee radiographs dated 11/25/2022 FINDINGS: Medial femoral condylar prosthesis in-situ. Hardware appears intact and well seated. There are no findings of fracture or dislocation. No joint effusion. Severe tricompartmental degenerative changes of the knee. Soft tissues are unremarkable. IMPRESSION: 1. No acute fracture or dislocation. 2. Similar severe tricompartmental degenerative changes of the knee. Electronically Signed   By: Limin  Xu M.D.   On: 02/26/2024 13:18    Procedures Procedures (including critical care time)  Medications Ordered in UC Medications  ketorolac  (TORADOL ) 15 MG/ML injection 15 mg (15 mg Intramuscular Given 02/26/24 1319)    Initial Impression / Assessment and Plan / UC Course  I have reviewed the triage vital signs and the nursing notes.  Pertinent labs & imaging results that were available during my care of the patient were reviewed by me and considered in my medical decision making (see chart for details).     Acute pain of left knee   X-ray done today of the left knee.  Final evaluation with the radiologist does not show any acute fractures although there is significant degenerative changes present.  Symptoms and physical exam findings are most consistent with a sprain of the left knee.  Recommend the following: Ice the area 2-3 times daily for 10-15 minutes to help with pain and swelling. Do not apply ice directly to the skin.  Wear compression wrap during the day when active. Remove at night. Remove if numbness, tingling or increased pain occurs. Do this for 5-7 days then may discontinue and increase activity as tolerated.    Toradol  injection given today. This is a medication to help with pain.  This is not a narcotic.  Recommend any heavy activity for the first 2 to 3 days.  Walking is as tolerated.  If you are not seeing any improvement in the next 3 to 4 days then would follow-up with the orthopedist. May  follow-up with urgent care as needed.  Final Clinical Impressions(s) / UC Diagnoses   Final diagnoses:  Acute pain of left knee     Discharge Instructions      X-ray done today of the left knee.  Final evaluation with the radiologist does not show any acute fractures although there is significant degenerative changes present.  Symptoms and physical exam findings are most consistent with a sprain of the left knee.  Recommend the following: Ice the area 2-3 times daily for 10-15 minutes to help with pain and swelling. Do not apply ice directly to the skin.  Wear compression wrap during the day when active. Remove at night. Remove if numbness, tingling or increased pain occurs. Do this for 5-7 days then may discontinue and increase activity as tolerated.    Toradol  injection given today. This is a medication to help with pain. This is not a narcotic.  Recommend any heavy activity for the first 2 to 3 days.  Walking is as tolerated.  If you are not seeing any improvement in the next 3 to 4 days then would follow-up with the orthopedist. May follow-up with urgent care as needed.     ED Prescriptions   None    PDMP not reviewed this encounter.   Teresa Almarie LABOR, [place] 02/26/24 1325

## 2024-02-26 NOTE — Discharge Instructions (Addendum)
 X-ray done today of the left knee.  Final evaluation with the radiologist does not show any acute fractures although there is significant degenerative changes present.  Symptoms and physical exam findings are most consistent with a sprain of the left knee.  Recommend the following: Ice the area 2-3 times daily for 10-15 minutes to help with pain and swelling. Do not apply ice directly to the skin.  Wear compression wrap during the day when active. Remove at night. Remove if numbness, tingling or increased pain occurs. Do this for 5-7 days then may discontinue and increase activity as tolerated.    Toradol  injection given today. This is a medication to help with pain. This is not a narcotic.  Recommend any heavy activity for the first 2 to 3 days.  Walking is as tolerated.  If you are not seeing any improvement in the next 3 to 4 days then would follow-up with the orthopedist. May follow-up with urgent care as needed.

## 2024-02-26 NOTE — ED Triage Notes (Addendum)
 Patient, accompanied by her son, reports a fall on Saturday morning. She states, I'm 78, and my balance isn't the best. I have a small cell phone and was heading to charge it at the end of the hall. I turned, ran into a wall, and fell to the floor, landing on my left knee, which buckled beneath me. Now, I have ongoing pain in my left knee. There was no head injury, and no cuts or abrasions were noted. The patient mentioned experiencing some dizziness around the time of the fall, which was reported to the staff at urgent care during check-in. However, she does not recall mentioning the dizziness or the fall episode itself. No chest pain (at the time or currently). No sob.

## 2024-03-23 ENCOUNTER — Inpatient Hospital Stay (HOSPITAL_COMMUNITY)
Admission: EM | Admit: 2024-03-23 | Discharge: 2024-03-27 | DRG: 054 | Disposition: A | Discharge status: Skilled Nursing Facility | Attending: Neurology | Admitting: Neurology

## 2024-03-23 ENCOUNTER — Inpatient Hospital Stay (HOSPITAL_COMMUNITY)

## 2024-03-23 ENCOUNTER — Emergency Department (HOSPITAL_COMMUNITY)

## 2024-03-23 DIAGNOSIS — Z79899 Other long term (current) drug therapy: Secondary | ICD-10-CM

## 2024-03-23 DIAGNOSIS — R1310 Dysphagia, unspecified: Secondary | ICD-10-CM | POA: Diagnosis present

## 2024-03-23 DIAGNOSIS — R4189 Other symptoms and signs involving cognitive functions and awareness: Secondary | ICD-10-CM | POA: Diagnosis present

## 2024-03-23 DIAGNOSIS — E785 Hyperlipidemia, unspecified: Secondary | ICD-10-CM | POA: Diagnosis present

## 2024-03-23 DIAGNOSIS — R296 Repeated falls: Secondary | ICD-10-CM | POA: Diagnosis present

## 2024-03-23 DIAGNOSIS — Z881 Allergy status to other antibiotic agents status: Secondary | ICD-10-CM

## 2024-03-23 DIAGNOSIS — R29721 NIHSS score 21: Secondary | ICD-10-CM | POA: Diagnosis present

## 2024-03-23 DIAGNOSIS — Z6835 Body mass index (BMI) 35.0-35.9, adult: Secondary | ICD-10-CM

## 2024-03-23 DIAGNOSIS — I6789 Other cerebrovascular disease: Secondary | ICD-10-CM | POA: Diagnosis present

## 2024-03-23 DIAGNOSIS — I639 Cerebral infarction, unspecified: Secondary | ICD-10-CM | POA: Diagnosis not present

## 2024-03-23 DIAGNOSIS — G936 Cerebral edema: Secondary | ICD-10-CM | POA: Diagnosis present

## 2024-03-23 DIAGNOSIS — G9389 Other specified disorders of brain: Secondary | ICD-10-CM

## 2024-03-23 DIAGNOSIS — Z8616 Personal history of COVID-19: Secondary | ICD-10-CM | POA: Diagnosis not present

## 2024-03-23 DIAGNOSIS — D496 Neoplasm of unspecified behavior of brain: Secondary | ICD-10-CM | POA: Diagnosis present

## 2024-03-23 DIAGNOSIS — G8194 Hemiplegia, unspecified affecting left nondominant side: Secondary | ICD-10-CM | POA: Diagnosis present

## 2024-03-23 DIAGNOSIS — Z885 Allergy status to narcotic agent status: Secondary | ICD-10-CM

## 2024-03-23 DIAGNOSIS — R414 Neurologic neglect syndrome: Secondary | ICD-10-CM | POA: Diagnosis present

## 2024-03-23 DIAGNOSIS — G40409 Other generalized epilepsy and epileptic syndromes, not intractable, without status epilepticus: Secondary | ICD-10-CM | POA: Diagnosis not present

## 2024-03-23 DIAGNOSIS — R471 Dysarthria and anarthria: Secondary | ICD-10-CM | POA: Diagnosis present

## 2024-03-23 DIAGNOSIS — I10 Essential (primary) hypertension: Secondary | ICD-10-CM | POA: Diagnosis present

## 2024-03-23 DIAGNOSIS — N179 Acute kidney failure, unspecified: Secondary | ICD-10-CM | POA: Diagnosis present

## 2024-03-23 DIAGNOSIS — R413 Other amnesia: Secondary | ICD-10-CM | POA: Diagnosis present

## 2024-03-23 DIAGNOSIS — Z7982 Long term (current) use of aspirin: Secondary | ICD-10-CM

## 2024-03-23 DIAGNOSIS — R29708 NIHSS score 8: Secondary | ICD-10-CM | POA: Diagnosis not present

## 2024-03-23 DIAGNOSIS — Z90710 Acquired absence of both cervix and uterus: Secondary | ICD-10-CM

## 2024-03-23 DIAGNOSIS — I619 Nontraumatic intracerebral hemorrhage, unspecified: Secondary | ICD-10-CM | POA: Diagnosis not present

## 2024-03-23 DIAGNOSIS — I6389 Other cerebral infarction: Secondary | ICD-10-CM

## 2024-03-23 DIAGNOSIS — R29810 Facial weakness: Secondary | ICD-10-CM | POA: Diagnosis present

## 2024-03-23 DIAGNOSIS — Z791 Long term (current) use of non-steroidal anti-inflammatories (NSAID): Secondary | ICD-10-CM

## 2024-03-23 DIAGNOSIS — E669 Obesity, unspecified: Secondary | ICD-10-CM | POA: Diagnosis present

## 2024-03-23 DIAGNOSIS — W19XXXA Unspecified fall, initial encounter: Secondary | ICD-10-CM | POA: Diagnosis present

## 2024-03-23 DIAGNOSIS — R569 Unspecified convulsions: Secondary | ICD-10-CM | POA: Diagnosis present

## 2024-03-23 DIAGNOSIS — K219 Gastro-esophageal reflux disease without esophagitis: Secondary | ICD-10-CM | POA: Diagnosis present

## 2024-03-23 DIAGNOSIS — G8384 Todd's paralysis (postepileptic): Secondary | ICD-10-CM | POA: Diagnosis not present

## 2024-03-23 DIAGNOSIS — R29706 NIHSS score 6: Secondary | ICD-10-CM | POA: Diagnosis not present

## 2024-03-23 DIAGNOSIS — I611 Nontraumatic intracerebral hemorrhage in hemisphere, cortical: Secondary | ICD-10-CM | POA: Diagnosis present

## 2024-03-23 DIAGNOSIS — M1990 Unspecified osteoarthritis, unspecified site: Secondary | ICD-10-CM | POA: Diagnosis present

## 2024-03-23 DIAGNOSIS — I69391 Dysphagia following cerebral infarction: Secondary | ICD-10-CM | POA: Diagnosis not present

## 2024-03-23 DIAGNOSIS — Z8701 Personal history of pneumonia (recurrent): Secondary | ICD-10-CM

## 2024-03-23 DIAGNOSIS — Z9889 Other specified postprocedural states: Secondary | ICD-10-CM

## 2024-03-23 DIAGNOSIS — I739 Peripheral vascular disease, unspecified: Secondary | ICD-10-CM | POA: Diagnosis not present

## 2024-03-23 DIAGNOSIS — Z96653 Presence of artificial knee joint, bilateral: Secondary | ICD-10-CM | POA: Diagnosis present

## 2024-03-23 DIAGNOSIS — Z91013 Allergy to seafood: Secondary | ICD-10-CM

## 2024-03-23 LAB — I-STAT CHEM 8, ED
BUN: 18 mg/dL (ref 8–23)
Calcium, Ion: 1.02 mmol/L — ABNORMAL LOW (ref 1.15–1.40)
Chloride: 102 mmol/L (ref 98–111)
Creatinine, Ser: 1.4 mg/dL — ABNORMAL HIGH (ref 0.44–1.00)
Glucose, Bld: 97 mg/dL (ref 70–99)
HCT: 45 % (ref 36.0–46.0)
Hemoglobin: 15.3 g/dL — ABNORMAL HIGH (ref 12.0–15.0)
Potassium: 3 mmol/L — ABNORMAL LOW (ref 3.5–5.1)
Sodium: 140 mmol/L (ref 135–145)
TCO2: 22 mmol/L (ref 22–32)

## 2024-03-23 LAB — CBG MONITORING, ED: Glucose-Capillary: 107 mg/dL — ABNORMAL HIGH (ref 70–99)

## 2024-03-23 LAB — ECHOCARDIOGRAM COMPLETE
MV VTI: 1.33 cm2
S' Lateral: 2.6 cm
Weight: 3294.55 [oz_av]

## 2024-03-23 LAB — COMPREHENSIVE METABOLIC PANEL WITH GFR
ALT: 9 U/L (ref 0–44)
AST: 27 U/L (ref 15–41)
Albumin: 3.2 g/dL — ABNORMAL LOW (ref 3.5–5.0)
Alkaline Phosphatase: 68 U/L (ref 38–126)
Anion gap: 19 — ABNORMAL HIGH (ref 5–15)
BUN: 16 mg/dL (ref 8–23)
CO2: 21 mmol/L — ABNORMAL LOW (ref 22–32)
Calcium: 8.4 mg/dL — ABNORMAL LOW (ref 8.9–10.3)
Chloride: 99 mmol/L (ref 98–111)
Creatinine, Ser: 1.56 mg/dL — ABNORMAL HIGH (ref 0.44–1.00)
GFR, Estimated: 33 mL/min — ABNORMAL LOW (ref >60)
Glucose, Bld: 100 mg/dL — ABNORMAL HIGH (ref 70–99)
Potassium: 3 mmol/L — ABNORMAL LOW (ref 3.5–5.1)
Sodium: 139 mmol/L (ref 135–145)
Total Bilirubin: 0.5 mg/dL (ref 0.0–1.2)
Total Protein: 7.4 g/dL (ref 6.5–8.1)

## 2024-03-23 LAB — DIFFERENTIAL
Abs Immature Granulocytes: 0.05 K/uL (ref 0.00–0.07)
Basophils Absolute: 0.1 K/uL (ref 0.0–0.1)
Basophils Relative: 1 %
Eosinophils Absolute: 0.1 K/uL (ref 0.0–0.5)
Eosinophils Relative: 1 %
Immature Granulocytes: 1 %
Lymphocytes Relative: 25 %
Lymphs Abs: 2 K/uL (ref 0.7–4.0)
Monocytes Absolute: 0.7 K/uL (ref 0.1–1.0)
Monocytes Relative: 9 %
Neutro Abs: 5.2 K/uL (ref 1.7–7.7)
Neutrophils Relative %: 63 %

## 2024-03-23 LAB — PROTIME-INR
INR: 1.1 (ref 0.8–1.2)
Prothrombin Time: 14.4 s (ref 11.4–15.2)

## 2024-03-23 LAB — ETHANOL: Alcohol, Ethyl (B): <15 mg/dL (ref <15)

## 2024-03-23 LAB — CBC
HCT: 47 % — ABNORMAL HIGH (ref 36.0–46.0)
Hemoglobin: 14.9 g/dL (ref 12.0–15.0)
MCH: 28.7 pg (ref 26.0–34.0)
MCHC: 31.7 g/dL (ref 30.0–36.0)
MCV: 90.4 fL (ref 80.0–100.0)
Platelets: 284 K/uL (ref 150–400)
RBC: 5.2 MIL/uL — ABNORMAL HIGH (ref 3.87–5.11)
RDW: 14.1 % (ref 11.5–15.5)
WBC: 8.1 K/uL (ref 4.0–10.5)
nRBC: 0 % (ref 0.0–0.2)

## 2024-03-23 LAB — MRSA NEXT GEN BY PCR, NASAL: MRSA by PCR Next Gen: NOT DETECTED

## 2024-03-23 LAB — APTT: aPTT: 24 s (ref 24–36)

## 2024-03-23 MED ORDER — POTASSIUM CHLORIDE 10 MEQ/100ML IV SOLN
10.0000 meq | INTRAVENOUS | Status: AC
  Administered 2024-03-23 (×5): 10 meq via INTRAVENOUS
  Filled 2024-03-23 (×5): qty 100

## 2024-03-23 MED ORDER — SODIUM CHLORIDE 0.9 % IV SOLN: INTRAVENOUS | Status: DC

## 2024-03-23 MED ORDER — CHLORHEXIDINE GLUCONATE CLOTH 2 % EX PADS
6.0000 | MEDICATED_PAD | Freq: Every day | CUTANEOUS | Status: DC
  Administered 2024-03-23 – 2024-03-26 (×4): 6 via TOPICAL

## 2024-03-23 MED ORDER — CLEVIDIPINE BUTYRATE 0.5 MG/ML IV EMUL
0.0000 mg/h | INTRAVENOUS | Status: DC
  Administered 2024-03-24: 2 mg/h via INTRAVENOUS
  Filled 2024-03-23: qty 50

## 2024-03-23 MED ORDER — ACETAMINOPHEN 650 MG RE SUPP: 650.0000 mg | RECTAL | Status: DC | PRN

## 2024-03-23 MED ORDER — DONEPEZIL HCL 10 MG PO TABS
10.0000 mg | ORAL_TABLET | Freq: Every day | ORAL | Status: DC
  Administered 2024-03-24 – 2024-03-27 (×4): 10 mg via ORAL
  Filled 2024-03-23 (×4): qty 1

## 2024-03-23 MED ORDER — ACETAMINOPHEN 325 MG PO TABS
650.0000 mg | ORAL_TABLET | ORAL | Status: DC | PRN
  Administered 2024-03-25 – 2024-03-26 (×3): 650 mg via ORAL
  Filled 2024-03-23 (×3): qty 2

## 2024-03-23 MED ORDER — ORAL CARE MOUTH RINSE: 15.0000 mL | OROMUCOSAL | Status: DC | PRN

## 2024-03-23 MED ORDER — LEVETIRACETAM (KEPPRA) 500 MG/5 ML ADULT IV PUSH
3000.0000 mg | Freq: Once | INTRAVENOUS | Status: AC
  Administered 2024-03-23: 3000 mg via INTRAVENOUS

## 2024-03-23 MED ORDER — ACETAMINOPHEN 160 MG/5ML PO SOLN: 650.0000 mg | ORAL | Status: DC | PRN

## 2024-03-23 MED ORDER — IOHEXOL 350 MG/ML SOLN
75.0000 mL | Freq: Once | INTRAVENOUS | Status: AC | PRN
  Administered 2024-03-23: 75 mL via INTRAVENOUS

## 2024-03-23 MED ORDER — LEVETIRACETAM (KEPPRA) 500 MG/5 ML ADULT IV PUSH
500.0000 mg | Freq: Two times a day (BID) | INTRAVENOUS | Status: DC
  Administered 2024-03-23 – 2024-03-26 (×6): 500 mg via INTRAVENOUS
  Filled 2024-03-23 (×6): qty 5

## 2024-03-23 MED ORDER — LORAZEPAM 2 MG/ML IJ SOLN: 2.0000 mg | Freq: Once | INTRAMUSCULAR | Status: AC

## 2024-03-23 MED ORDER — PANTOPRAZOLE SODIUM 40 MG IV SOLR
40.0000 mg | Freq: Every day | INTRAVENOUS | Status: DC
  Administered 2024-03-23 – 2024-03-25 (×3): 40 mg via INTRAVENOUS
  Filled 2024-03-23 (×3): qty 10

## 2024-03-23 MED ORDER — STROKE: EARLY STAGES OF RECOVERY BOOK
Freq: Once | Status: AC
  Filled 2024-03-23: qty 1

## 2024-03-23 MED ORDER — LORAZEPAM 2 MG/ML IJ SOLN
INTRAMUSCULAR | Status: AC
  Administered 2024-03-23: 2 mg via INTRAVENOUS
  Filled 2024-03-23: qty 1

## 2024-03-23 MED ORDER — GADOBUTROL 1 MMOL/ML IV SOLN
9.0000 mL | Freq: Once | INTRAVENOUS | Status: AC | PRN
  Administered 2024-03-23: 9 mL via INTRAVENOUS

## 2024-03-23 MED ORDER — SODIUM CHLORIDE 0.9% FLUSH
3.0000 mL | Freq: Once | INTRAVENOUS | Status: AC
  Administered 2024-03-23: 3 mL via INTRAVENOUS

## 2024-03-23 NOTE — Procedures (Signed)
 Patient Name: [name]  MRN: [id number]  Epilepsy Attending: Arlin MALVA Krebs  Referring Physician/Provider: Merrianne Locus, MD  Date: 03/23/2024 Duration: 36.55 mins  Patient history: 78 y.o. female with hx of hypertension, osteoarthritis and memory loss who presents after sudden seizure activity and sudden onset left-sided weakness. EEG to evaluate for seizure  Level of alertness: Awake  AEDs during EEG study: LEV, Ativan  Technical aspects: This EEG study was done with scalp electrodes positioned according to the 10-20 International system of electrode placement. Electrical activity was reviewed with band pass filter of 1-70Hz , sensitivity of 7 uV/mm, display speed of 35mm/sec with a 60Hz  notched filter applied as appropriate. EEG data were recorded continuously and digitally stored.  Video monitoring was available and reviewed as appropriate.  Description: The posterior dominant rhythm consists of 8-9Hz  activity of moderate voltage (25-35 uV) seen predominantly in posterior head regions, asymmetric( right<left) and reactive to eye opening and eye closing. Drowsiness was characterized by attenuation of the posterior background rhythm. EEG showed continuous 3 to 6 Hz theta-delta slowing in right hemisphere. Hyperventilation and photic stimulation were not performed.     ABNORMALITY - Continuous slow, right hemisphere.  IMPRESSION: This study is suggestive of cortical dysfunction arising from right hemisphere likely secondary to underlying structural abnormality, post-ictal state. No seizures or epileptiform discharges were seen throughout the recording.  Paitlyn Mcclatchey O [place]

## 2024-03-23 NOTE — Progress Notes (Signed)
 STAT EEG complete. Results pending

## 2024-03-23 NOTE — ED Triage Notes (Signed)
 Pt bib [facility] from home after family witnessed pt having possible seizure for 1.5 min. Pt has no hx of seizures LKW 10:20. L arm and foot weakness and left lacial droop. Pt bit tongue. EMS gave 5 of versed IM in route.  146/92 102 99% 3L 123 CBG 18 L FA

## 2024-03-23 NOTE — Evaluation (Signed)
 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   Essential hypertension 05/31/2012   GERD (gastroesophageal reflux disease) 05/31/2012   Osteoarthritis of knee, unspecified 04/25/2012   Past Medical History:  Past Medical History:  Diagnosis Date   Hypertension    Past Surgical History:  Past Surgical History:  Procedure Laterality Date   BREAST EXCISIONAL BIOPSY Bilateral    MEDIAL PARTIAL KNEE REPLACEMENT Bilateral 06/07/2012   HPI:  78 yo female with history of HTN, osteoarthritis, and memory loss who presents after sudden seizure activity with sudden onset L sided weakness. CTH shows R opercular acute parenchymal hemorrhage with adjacent cytotoxic edema.   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 This varies, ranging from moderate-severe within the timeframe of our session. 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  Her primary goal is to return to the pulpit and is eager to continue interventions targeting her speech. Additionally, she demonstrates deficits related to safety awareness and inhibition. SLP will continue following for further assessment.    SLP Assessment  SLP  Recommendation/Assessment: Patient needs continued Speech Language Pathology Services SLP Visit Diagnosis: Dysarthria and anarthria (R47.1);Cognitive communication deficit (R41.841)     Assistance Recommended at Discharge  Frequent or constant Supervision/Assistance  Functional Status Assessment Patient has had a recent decline in their functional status and demonstrates the ability to make significant improvements in function in a reasonable and predictable amount of time.  Frequency and Duration min 2x/week  2 weeks      SLP Evaluation Cognition  Overall Cognitive Status: History of cognitive impairments - at baseline Arousal/Alertness: Awake/alert Orientation Level: Oriented X4 Attention: Sustained Sustained Attention: Impaired Sustained Attention Impairment: Verbal basic;Functional basic Memory: Impaired Memory Impairment: Decreased recall of new information;Decreased short term memory Decreased Short Term Memory: Verbal basic;Functional basic Awareness: Impaired Awareness Impairment: Emergent impairment Problem Solving: Appears intact       Comprehension  Auditory Comprehension Overall Auditory Comprehension: Appears within functional limits for tasks assessed    Expression Expression Primary Mode of Expression: Verbal Verbal Expression Overall Verbal Expression: Appears within functional limits for tasks assessed   Oral / Motor  Oral Motor/Sensory Function Overall Oral Motor/Sensory Function: Moderate impairment Facial ROM: Reduced left;Suspected CN VII (facial) dysfunction Facial Symmetry: Abnormal symmetry left;Suspected CN VII (facial) dysfunction Facial Strength: Reduced left;Suspected CN VII (facial) dysfunction Facial Sensation: Reduced left;Suspected CN V (Trigeminal) dysfunction Lingual ROM: Reduced left;Suspected CN XII (hypoglossal) dysfunction Lingual Symmetry: Abnormal symmetry left;Suspected CN XII (hypoglossal) dysfunction Motor Speech Overall Motor  Speech: Impaired Respiration: Within functional limits Phonation: Normal Resonance: Within functional limits Articulation: Impaired Level of Impairment: Phrase Intelligibility: Intelligibility reduced Phrase: 50-74% accurate Sentence: 25-49% accurate            Damien Blumenthal, M.A., CCC-SLP Speech Language Pathology, Acute  Rehabilitation Services  Secure Chat preferred [phone]  03/23/2024, 5:27 PM

## 2024-03-23 NOTE — Progress Notes (Signed)
  Echocardiogram 2D Echocardiogram has been performed.  Merlynn Argyle 03/23/2024, 3:00 PM

## 2024-03-23 NOTE — ED Provider Notes (Signed)
 [place] EMERGENCY DEPARTMENT AT [facility] Provider Note   CSN: [id number] Arrival date & time: 03/23/24  1107  An emergency department physician performed an initial assessment on this suspected stroke patient at 1110.  Patient presents with: Code Stroke   [name] is a 78 y.o. female.   This is a 78 year old female presenting emergency department as a code stroke.  Last known normal 1020.  Family witnessed possible seizure activity, then had left-sided weakness.  Patient is postictal and has received Versed and Ativan and unable to provide meaningful history.        Prior to Admission medications   Medication Sig Start Date End Date Taking? Authorizing Provider  albuterol  (VENTOLIN  HFA) 108 (90 Base) MCG/ACT inhaler Inhale 1-2 puffs into the lungs every 6 (six) hours as needed for wheezing or shortness of breath. 01/08/21   Stuart Vernell Norris, PA-C  aspirin  EC 81 MG tablet Take 81 mg by mouth daily.    [provider]  cefUROXime  (CEFTIN ) 250 MG tablet Take 1 tablet (250 mg total) by mouth 2 (two) times daily with a meal. 03/19/23   Pollina, Lonni PARAS, MD  celecoxib (CELEBREX) 100 MG capsule Take 100 mg by mouth daily. 02/04/23   [provider]  cyclobenzaprine  (FLEXERIL ) 5 MG tablet Take 1 tablet (5 mg total) by mouth 3 (three) times daily as needed for muscle spasms. 02/26/23   Patt Alm Macho, MD  diclofenac Sodium (VOLTAREN) 1 % GEL Apply 1 g topically 2 (two) times daily. 12/13/22   [provider]  Diethylpropion HCl CR 75 MG TB24 Take 75 mg by mouth daily. Reported on 06/03/2015 07/17/14   [provider]  donepezil (ARICEPT) 10 MG tablet Take 10 mg by mouth daily. 03/08/23   [provider]  hydrochlorothiazide (HYDRODIURIL) 12.5 MG tablet Take 12.5 mg by mouth daily. 02/06/19   [provider]  lidocaine  (HM LIDOCAINE  PATCH) 4 % Place 1 patch onto the skin daily. 01/16/23   Ladora Congress, PA  losartan  (COZAAR) 100 MG tablet Take 100 mg by mouth daily. 04/25/12   [provider]  losartan (COZAAR) 50 MG tablet Take 50 mg by mouth daily. 02/06/19   [provider]  losartan-hydrochlorothiazide (HYZAAR) 50-12.5 MG tablet Take 1 tablet by mouth daily. 02/09/24   [provider]  omeprazole (PRILOSEC) 40 MG capsule Take 40 mg by mouth daily.  08/01/14   [provider]  oxyCODONE -acetaminophen  (PERCOCET/ROXICET) 5-325 MG tablet Take 1 tablet by mouth every 6 (six) hours as needed for severe pain. 01/16/23   Ladora Congress, PA  prednisoLONE acetate (PRED FORTE) 1 % ophthalmic suspension INSTILL 1 DROP INTO RIGHT EYE FOUR TIMES DAILY 09/22/19   [provider]  rosuvastatin  (CRESTOR ) 5 MG tablet Take 5 mg by mouth daily.    [provider]    Allergies: Shellfish protein-containing drug products, Carisoprodol-aspirin -codeine, Codeine, Doxycycline, Tetracycline, Tetracyclines & related, and Vibramycin [doxycycline calcium ]    Review of Systems  Updated Vital Signs BP 134/82   Pulse 94   Resp (!) 21   Wt 93.4 kg   SpO2 100%   BMI 35.34 kg/m   Physical Exam Vitals and nursing note reviewed.  Constitutional:      General: She is not in acute distress.    Appearance: She is obese. She is not toxic-appearing.  HENT:     Head: Normocephalic.     Nose: Nose normal.     Mouth/Throat:  Mouth: Mucous membranes are moist.  Cardiovascular:     Rate and Rhythm: Normal rate.  Pulmonary:     Effort: Pulmonary effort is normal.  Abdominal:     General: There is no distension.  Skin:    General: Skin is warm.     Capillary Refill: Capillary refill takes less than 2 seconds.  Neurological:     Comments: Facial droop, left-sided weakness  Psychiatric:     Comments: Not possible to assess     (all labs ordered are listed, but only abnormal results are displayed) Labs Reviewed  CBC - Abnormal; Notable for the following components:      Result Value    RBC 5.20 (*)    HCT 47.0 (*)    All other components within normal limits  I-STAT CHEM 8, ED - Abnormal; Notable for the following components:   Potassium 3.0 (*)    Creatinine, Ser 1.40 (*)    Calcium , Ion 1.02 (*)    Hemoglobin 15.3 (*)    All other components within normal limits  CBG MONITORING, ED - Abnormal; Notable for the following components:   Glucose-Capillary 107 (*)    All other components within normal limits  PROTIME-INR  APTT  DIFFERENTIAL  COMPREHENSIVE METABOLIC PANEL WITH GFR  ETHANOL    EKG: None  Radiology: CT HEAD CODE STROKE WO CONTRAST Result Date: 03/23/2024 EXAM: CT HEAD WITHOUT CONTRAST 03/23/2024 11:18:09 AM TECHNIQUE: CT of the head was performed without the administration of intravenous contrast. Automated exposure control, iterative reconstruction, and/or weight based adjustment of the mA/kV was utilized to reduce the radiation dose to as low as reasonably achievable. COMPARISON: Brain MRI 08/08/2007. Head CT 02/26/2023. CLINICAL HISTORY: 78 year old female with acute neuro deficit, stroke suspected. FINDINGS: BRAIN AND VENTRICLES: Small new round 5 to 6 mm subcortical white matter hypodensity at the right operculum most resembles a small parenchymal hemorrhage (sagittal image 13). There does appear to be nearby cytotoxic edema in the frontal operculum including gray matter involvement on series 9 image 35. No other acute intracranial hemorrhage. No other cytotoxic edema identified. No hydrocephalus. No extra-axial collection. No intracranial mass effect or midline shift. Calcified atherosclerosis at the skull base. Advanced chronic bilateral cerebral white matter hypodensity, confluent in both cerebral hemispheres and with bilateral deep white matter capsule involvement. Less pronounced chronic heterogeneity in the deep gray nuclei. No convincing asymmetric vessel hyperdensity. No gaze deviation. ORBITS: No acute abnormality. SINUSES: No acute abnormality.  SOFT TISSUES AND SKULL: No acute soft tissue abnormality. No skull fracture. [place] stroke program early CT score (aspects) ----- Ganglionic (caudate, ic, lentiform nucleus, insula, M1-m3): 7 Supraganglionic (m4-m6): 2 Total: 9 IMPRESSION: 1. Right opercular Small acute parenchymal hemorrhage (56 mm) with adjacent cytotoxic edema involving the frontal operculum gray matter. ASPECTS 9. 2. No intracranial mass effect or additional acute intracranial hemorrhage. Chronic small vessel disease. 3. These results were communicated to Dr. Lindzen at 1125 hours on 03/23/2024 by text page via the [facility] messaging system. Electronically signed by: Helayne Hurst MD 03/23/2024 11:27 AM EST RP Workstation: [id number]     Procedures   Medications Ordered in the ED  clevidipine (CLEVIPREX) infusion 0.5 mg/mL (has no administration in time range)  levETIRAcetam (KEPPRA) undiluted injection 500 mg (has no administration in time range)   stroke: early stages of recovery book (has no administration in time range)  acetaminophen  (TYLENOL ) tablet 650 mg (has no administration in time range)    Or  acetaminophen  (TYLENOL ) 160 MG/5ML solution 650  mg (has no administration in time range)    Or  acetaminophen  (TYLENOL ) suppository 650 mg (has no administration in time range)  pantoprazole  (PROTONIX ) injection 40 mg (has no administration in time range)  0.9 %  sodium chloride  infusion (has no administration in time range)  sodium chloride  flush (NS) 0.9 % injection 3 mL (3 mLs Intravenous Given 03/23/24 1126)  LORazepam (ATIVAN) injection 2 mg (2 mg Intravenous Not Given 03/23/24 1126)  levETIRAcetam (KEPPRA) undiluted injection 3,000 mg (3,000 mg Intravenous Given 03/23/24 1119)    Clinical Course as of 03/23/24 1150  Fri Mar 23, 2024  777 78 year old presented as a code stroke.  Last known normal this morning roughly 10:20 AM.  Had a seizure, then left-sided weakness, gaze preference and facial droop.  Had another  seizure with EMS received Versed and route.  Was given 2 mg Ativan and loaded with Keppra by neurology on arrival.  CT head on my review was negative for acute pathology.  However radiology reading as parenchymal hemorrhage.  Neurology to admit for hemorrhagic stroke. [TY]  1135 CT HEAD CODE STROKE WO CONTRAST IMPRESSION: 1. Right opercular Small acute parenchymal hemorrhage (56 mm) with adjacent cytotoxic edema involving the frontal operculum gray matter. ASPECTS 9. 2. No intracranial mass effect or additional acute intracranial hemorrhage. Chronic small vessel disease. 3. These results were communicated to Dr. Lindzen at 1125 hours on 03/23/2024 by text page via the [facility] messaging system.  Electronically signed by: Helayne Hurst MD 03/23/2024 11:27 AM EST RP Workstation: [id number]   [TY]    Clinical Course User Index [TY] Neysa Caron PARAS, DO                                 Medical Decision Making 78 year old presented as code stroke.  History of obesity and hypertension per chart review.  EMS provided most history last known normal 1020.  Received Versed and route.  Given Ativan and loaded with Keppra by neurology.  CT head with hemorrhagic stroke.  Admit to neurology.  Amount and/or Complexity of Data Reviewed Independent Historian: EMS External Data Reviewed:     Details: Not on blood thinner Labs: ordered. Radiology: ordered and independent interpretation performed. Decision-making details documented in ED Course. ECG/medicine tests: independent interpretation performed.    Details: Appears to be sinus rhythm  Risk Decision regarding hospitalization. Diagnosis or treatment significantly limited by social determinants of health.       Final diagnoses:  None    ED Discharge Orders     None          Neysa Caron PARAS, DO 03/23/24 1150

## 2024-03-23 NOTE — H&P (Signed)
 NEUROLOGY H&P NOTE   Date of service: March 23, 2024 Patient Name: [name] MRN:  [id number] DOB:  [birth year] Chief Complaint: Seizure activity and left-sided weakness  History of Present Illness  [name] is an 78 y.o. female with a PMHx of hypertension, osteoarthritis and memory loss who presents after sudden seizure activity and sudden onset left-sided weakness.  Patient was in her usual state of health and was talking on the phone with a family member when she suddenly dropped the phone.  Another family member came to check on her and found her having a GTC seizure and called EMS.  She was noted to have left-sided hemiplegia and left gaze deviation afterwards by EMS and had another GTC seizure en route to the hospital, for which 5 mg of Versed was administered followed by cessation of the seizure activity, but with dense left sided weakness and left facial droop. The seizure witnessed by EMS lasted for about 1.5 minutes. She has no previous history of seizures and no known seizure risk factors.  On arrival, she was noted to have rhythmic twitching of the left side of the jaw and neck, along with left sided weakness, left facial droop, dysarthria and hypophonic speech.  Last known well: 1020 Modified rankin score: 1-No significant post stroke disability and can perform usual duties with stroke symptoms ICH Score: 2 tNKASE: Not offered due to ICH Thrombectomy: Not offered due to ICH  NIHSS components Score: Comment  1a Level of Conscious 0[]  1[x]  2[]  3[]      1b LOC Questions 0[]  1[x]  2[]       1c LOC Commands 0[]  1[]  2[x]       2 Best Gaze 0[x]  1[]  2[]       3 Visual 0[x]  1[]  2[]  3[]      4 Facial Palsy 0[]  1[]  2[x]  3[]      5a Motor Arm - left 0[]  1[]  2[]  3[x]  4[]  UN[]    5b Motor Arm - Right 0[]  1[x]  2[]  3[]  4[]  UN[]    6a Motor Leg - Left 0[]  1[]  2[]  3[x]  4[]  UN[]    6b Motor Leg - Right 0[]  1[]  2[]  3[x]  4[]  UN[]    7 Limb Ataxia 0[x]  1[]  2[]  UN[]      8 Sensory 0[]  1[x]  2[]  UN[]       9 Best Language 0[]  1[]  2[x]  3[]      10 Dysarthria 0[]  1[]  2[x]  UN[]      11 Extinct. and Inattention 0[]  1[]  2[]       TOTAL:21   21      ROS   Unable to perform due to altered mental status.  Past History   Past Medical History:  Diagnosis Date   Hypertension    Past Surgical History:  Procedure Laterality Date   BREAST EXCISIONAL BIOPSY Bilateral    MEDIAL PARTIAL KNEE REPLACEMENT Bilateral 06/07/2012   No family history on file. Social History   Socioeconomic History   Marital status: Widowed    Spouse name: Not on file   Number of children: Not on file   Years of education: Not on file   Highest education level: Not on file  Occupational History   Not on file  Tobacco Use   Smoking status: Never   Smokeless tobacco: Never  Vaping Use   Vaping status: Never Used  Substance and Sexual Activity   Alcohol use: No    Alcohol/week: 0.0 standard drinks of alcohol   Drug use: No   Sexual activity: Not Currently    Birth control/protection:  Surgical    Comment: hysterectomy  Other Topics Concern   Not on file  Social History Narrative   Not on file   Social Drivers of Health   Financial Resource Strain: Not on file  Food Insecurity: Not on file  Transportation Needs: Not on file  Physical Activity: Not on file  Stress: Not on file  Social Connections: Not on file   Allergies  Allergen Reactions   Shellfish Protein-Containing Drug Products Other (See Comments)    Shrimp and lobster--severe swelling/itching   Carisoprodol-Aspirin -Codeine Other (See Comments)   Codeine Nausea And Vomiting   Doxycycline Nausea Only   Tetracycline Nausea Only   Tetracyclines & Related Nausea And Vomiting   Vibramycin [Doxycycline Calcium ] Nausea And Vomiting    Medications    No current facility-administered medications on file prior to encounter.   Current Outpatient Medications on File Prior to Encounter  Medication Sig Dispense Refill   albuterol  (VENTOLIN  HFA)  108 (90 Base) MCG/ACT inhaler Inhale 1-2 puffs into the lungs every 6 (six) hours as needed for wheezing or shortness of breath. 18 g 0   aspirin  EC 81 MG tablet Take 81 mg by mouth daily.     cefUROXime  (CEFTIN ) 250 MG tablet Take 1 tablet (250 mg total) by mouth 2 (two) times daily with a meal. 20 tablet 0   celecoxib (CELEBREX) 100 MG capsule Take 100 mg by mouth daily.     cyclobenzaprine  (FLEXERIL ) 5 MG tablet Take 1 tablet (5 mg total) by mouth 3 (three) times daily as needed for muscle spasms. 10 tablet 0   diclofenac Sodium (VOLTAREN) 1 % GEL Apply 1 g topically 2 (two) times daily.     Diethylpropion HCl CR 75 MG TB24 Take 75 mg by mouth daily. Reported on 06/03/2015  0   donepezil (ARICEPT) 10 MG tablet Take 10 mg by mouth daily.     hydrochlorothiazide (HYDRODIURIL) 12.5 MG tablet Take 12.5 mg by mouth daily.     lidocaine  (HM LIDOCAINE  PATCH) 4 % Place 1 patch onto the skin daily. 5 patch 0   losartan (COZAAR) 100 MG tablet Take 100 mg by mouth daily.     losartan (COZAAR) 50 MG tablet Take 50 mg by mouth daily.     losartan-hydrochlorothiazide (HYZAAR) 50-12.5 MG tablet Take 1 tablet by mouth daily.     omeprazole (PRILOSEC) 40 MG capsule Take 40 mg by mouth daily.   0   oxyCODONE -acetaminophen  (PERCOCET/ROXICET) 5-325 MG tablet Take 1 tablet by mouth every 6 (six) hours as needed for severe pain. 8 tablet 0   prednisoLONE acetate (PRED FORTE) 1 % ophthalmic suspension INSTILL 1 DROP INTO RIGHT EYE FOUR TIMES DAILY     rosuvastatin  (CRESTOR ) 5 MG tablet Take 5 mg by mouth daily.       Vitals   Vitals:   03/23/24 1100 03/23/24 1117  BP:  134/82  Weight: 93.4 kg      Body mass index is 35.34 kg/m.  Physical Exam   Constitutional: Ill-appearing elderly patient in no acute distress Eyes: No scleral injection.  HENT: No OP obstruction.  Head: Normocephalic.  Cardiovascular: Normal rate and regular rhythm.  Respiratory: Respirations sometimes snoring but nonlabored on  supplemental O2 Skin: WDI.   Neurologic Examination   Mental Status: Patient is very drowsy but would initially attempt to follow simple commands and tried to answer questions.  She was initially able to respond to her name and state her age.  After administration of lorazepam,  she became more drowsy and nonverbal Speech/Language: speech is with severe dysarthria and in single words Cranial Nerves:  II: PERRL.  Blinks to threat bilaterally III, IV, VI: EOMI.  VII: Left facial droop VIII: hearing intact to voice. IX, X: Voice is very dysarthric XII: tongue is midline without fasciculations. Motor: Moves right upper extremity with antigravity strength, moves left upper extremity only to vigorous noxious stimuli with slight antigravity strength, moves bilateral lower extremities, right spontaneously and left with noxious but does not lift them off the bed Tone is normal and bulk is normal Sensation- Intact to noxious throughout Coordination: Unable to perform Gait- Deferred   Labs   CBC:  Recent Labs  Lab 03/23/24 1110 03/23/24 1126  WBC 8.1  --   NEUTROABS 5.2  --   HGB 14.9 15.3*  HCT 47.0* 45.0  MCV 90.4  --   PLT 284  --     Basic Metabolic Panel:  Lab Results  Component Value Date   NA 140 03/23/2024   K 3.0 (L) 03/23/2024   CO2 27 03/18/2023   GLUCOSE 97 03/23/2024   BUN 18 03/23/2024   CREATININE 1.40 (H) 03/23/2024   CALCIUM  9.0 03/18/2023   GFRNONAA >60 03/18/2023   GFRAA >60 03/05/2019   Lipid Panel: No results found for: LDLCALC HgbA1c: No results found for: HGBA1C Urine Drug Screen: No results found for: LABOPIA, COCAINSCRNUR, LABBENZ, AMPHETMU, THCU, LABBARB  Alcohol Level No results found for: [facility] INR  Lab Results  Component Value Date   INR 1.0 02/26/2023   APTT No results found for: APTT  CT Head without contrast (Personally reviewed): Small IPH in the right operculum with adjacent cytotoxic edema, small vessel  disease  CT angio Head and Neck with contrast(Personally reviewed): Pending  MRI Brain(Personally reviewed): Pending  rEEG:  Pending  Impression  [name] is a 78 y.o. female with hx of hypertension, osteoarthritis and memory loss who presents after witnessed GTC seizure at home followed by left-sided facial droop and left-sided hemiplegia.  She had another seizure en route and was given 5 mg of Versed.  She was noted to have rhythmic left neck and jaw twitching and was administered 2 mg of Ativan in the ED and loaded with Keppra.  She has no known seizure risk factors.   - Exam reveals left sided Todd's paralysis, left hemisensory deficit, dysphasia, dysarthria and left facial droop  - CT head reveals small acute-appearing IPH in the right operculum, which most likely triggered the patient's new onset seizures.   - Patient will need admission to ICU for post ICH care as well as EEG as her mental status has not returned to baseline postictally.   - Suspect that the small IPH may be the reason for seizures and that the left sided weakness and facial droop are due to Todd's paralysis.   - Will obtain EEG with conversion to LTM if abnormalities are seen.   - Will also start maintenance Keppra given repeated seizure activity.  Primary Diagnosis:  Small right opercular IPH  Secondary Diagnosis: GTC seizures  Plan  - Admit to ICU - Stability scan in 6 hours or STAT with any neurological decline - Frequent neuro checks; q40min for 1 hour, then q1hour - No antiplatelets or anticoagulants due to ICH - SCD for DVT prophylaxis, pharmacological DVT ppx at 24 hours if ICH is stable - Blood pressure control with goal systolic 130 - 150, cleverplex and labetalol PRN - Stroke labs, HgbA1c, fasting  lipid panel - MRI brain with and without contrast when stabilized to evaluate for underlying mass - CTA head and neck to evaluate for underlying vascular abnormality. - Risk factor modification -  Echocardiogram - PT consult, OT consult, Speech consult - Stat EEG, convert to LTM if abnormality seen - Loaded with Keppra 3000 mg, start 500 mg of Keppra every 12 hours, further AEDs dependent on EEG - Seizure precautions - Stroke team to follow    ______________________________________________________________________ Patient seen by NP with MD  Signed, Cortney E Everitt Clint Kill, NP [facility]   I have seen and examined the patient. I have discussed the assessment and recommendations with the Neurology NP and made amendations as needed. 78 year old female presenting with new-onset seizures secondary to acute small right frontal operculum ICH. Exam with NIHSS 21, deficits referable to the right cerebral hemisphere, most likely secondary to Todd's paralysis. Plan for seizure and post-ICH management as documented above.  Electronically signed: Dr. Meela Wareing

## 2024-03-23 NOTE — Evaluation (Signed)
 Clinical/Bedside Swallow Evaluation Patient Details  Name: [name] MRN: [id number] Date of Birth: 1946/01/22  Today's Date: 03/23/2024 Time: SLP Start Time (ACUTE ONLY): 1645 SLP Stop Time (ACUTE ONLY): 1700 SLP Time Calculation (min) (ACUTE ONLY): 15 min  Past Medical History:  Past Medical History:  Diagnosis Date   Hypertension    Past Surgical History:  Past Surgical History:  Procedure Laterality Date   BREAST EXCISIONAL BIOPSY Bilateral    MEDIAL PARTIAL KNEE REPLACEMENT Bilateral 06/07/2012   HPI:  78 yo female with history of HTN, osteoarthritis, and memory loss who presents after sudden seizure activity with sudden onset L sided weakness. CTH shows R opercular acute parenchymal hemorrhage with adjacent cytotoxic edema.    Assessment / Plan / Recommendation  Clinical Impression  RN can offer ice chips in moderation with frequent oral care and meds crushed in puree. Otherwise, recommend she remain NPO pending instrumental assessment. Will f/u as scheduling allows for MBS.   Pt presents with moderate L CN VII and XII deficits. Large sips of water are followed by extensive coughing. She is impulsive with reduced safety awareness, repeatedly attempting to fill her mouth with more water while still actively coughing. Oral transit appears overall functional with pureed solids, though further assessment is necessary to ensure oral clearance with advanced solids given extent of facial and lingual weakness.  SLP Visit Diagnosis: Dysphagia, unspecified (R13.10)    Aspiration Risk  Mild aspiration risk    Diet Recommendation NPO except meds;Ice chips PRN after oral care    Medication Administration: Crushed with puree    Other  Recommendations Oral Care Recommendations: Oral care QID;Oral care prior to ice chip/H20     Assistance Recommended at Discharge    Functional Status Assessment Patient has had a recent decline in their functional status and demonstrates the ability  to make significant improvements in function in a reasonable and predictable amount of time.  Frequency and Duration min 2x/week  2 weeks       Prognosis Prognosis for improved oropharyngeal function: Good Barriers to Reach Goals: Cognitive deficits      Swallow Study   General HPI: 78 yo female with history of HTN, osteoarthritis, and memory loss who presents after sudden seizure activity with sudden onset L sided weakness. CTH shows R opercular acute parenchymal hemorrhage with adjacent cytotoxic edema. Type of Study: Bedside Swallow Evaluation Previous Swallow Assessment: none in chart Diet Prior to this Study: NPO Temperature Spikes Noted: No Respiratory Status: Room air History of Recent Intubation: No Behavior/Cognition: Alert;Cooperative Oral Cavity Assessment: Within Functional Limits Oral Care Completed by SLP: No Oral Cavity - Dentition: Adequate natural dentition Vision: Functional for self-feeding Self-Feeding Abilities: Able to feed self Patient Positioning: Upright in bed Baseline Vocal Quality: Normal Volitional Cough: Strong Volitional Swallow: Able to elicit    Oral/Motor/Sensory Function Overall Oral Motor/Sensory Function: Moderate impairment Facial ROM: Reduced left;Suspected CN VII (facial) dysfunction Facial Symmetry: Abnormal symmetry left;Suspected CN VII (facial) dysfunction Facial Strength: Reduced left;Suspected CN VII (facial) dysfunction Facial Sensation: Reduced left;Suspected CN V (Trigeminal) dysfunction Lingual ROM: Reduced left;Suspected CN XII (hypoglossal) dysfunction Lingual Symmetry: Abnormal symmetry left;Suspected CN XII (hypoglossal) dysfunction   Ice Chips Ice chips: Not tested   Thin Liquid Thin Liquid: Impaired Presentation: Straw;Self Fed Pharyngeal  Phase Impairments: Cough - Immediate    Nectar Thick Nectar Thick Liquid: Not tested   Honey Thick Honey Thick Liquid: Not tested   Puree Puree: Within functional  limits Presentation: Spoon;Self Fed  Solid     Solid: Not tested      Damien Blumenthal, M.A., CCC-SLP Speech Language Pathology, Acute Rehabilitation Services  Secure Chat preferred [phone]  03/23/2024,5:18 PM

## 2024-03-23 NOTE — Code Documentation (Addendum)
 Stroke Response Nurse Documentation Code Documentation  [name] is a 78 y.o. female arriving to [facility]  via [facility] on 03/23/2024 with past medical hx of HTN. On aspirin  81 mg daily. Code stroke was activated by EMS.   Patient from home where she was LKW at 1020 and now complaining of slurred speech, left sided weakness, left facial droop. Per EMS, family saw patient on the phone at 1020, when they check a few minutes later they noticed seizure activity that last about 1.5 min Upon EMS arrival, they noticed left facial droop, left sided flaccid and left gaze.  En route, patient began actively seizing and bit her tongue; 5mg  IM Versed was given.    Stroke team at the bedside on patient arrival. Labs drawn and patient cleared for CT by Dr. Neysa. Patient to CT with team. NIHSS 22, see documentation for details and code stroke times. Patient with decreased LOC, disoriented, not following commands, left facial droop, bilateral arm weakness, bilateral leg weakness, left decreased sensation, Global aphasia , and dysarthria  on exam. The following imaging was completed:  CT Head. Patient is not a candidate for IV Thrombolytic due to ICH per MD. Patient is not a candidate for IR due to ICH per MD.   Care Plan: VS/NIHSS/Pupil Checks q1hr; SBP Goal 130-150.   Bedside handoff with ED RN Andriette.    [name]  Stroke Response RN

## 2024-03-23 NOTE — Progress Notes (Addendum)
 STROKE TEAM PROGRESS NOTE    SIGNIFICANT HOSPITAL EVENTS  11/7: Presented s/p fall with seizure with left gaze deviation and left-sided weakness. Had another seizure en route, given 5mg  Versed by EMS.  CTH showed small right IPH with adjacent cytotoxic edema MRI suspicious for infiltrative high-grade tour as opposed to subacute ischemia.    INTERIM HISTORY/SUBJECTIVE  Son at bedside. States she fell about a month ago, but adds that she falls a lot, has a to use a walker at home. His brother had to move in with her. Says she forgets a lot, they do not let her drive anymore d/t her getting lost, decreasing cognitive skills. Spoke to patient's son on phone who lives with patient, says that she did tel him she had a headache yesterday, but hadnt c/o this before.  The son witnessed generalized tonic-clonic in the patient at home. Patient still has slurred speech and is able to say she felt weird 2 days ago, but unable to give clearer history. She says she is at home. She was able to say the correct year when given choices. Still has left-sided weakness, pain in LLE.  Reviewed imaging, assessment and plan of care with patient and family.   BP goal 130-150 for 24 hours, then < 160. IVP PRNs and Cleviprex as needed.  MRI scan shows right insular lesion with cytotoxic edema and petechial hemorrhage.  Postcontrast images show unusual enhancement of the right thalamus as well as another smaller area of enhancement in the right frontal lobe of unclear significance.  No definite diffusion positivity or ADC dark signal to suggest classical infarct OBJECTIVE  CBC    Component Value Date/Time   WBC 8.1 03/23/2024 1110   RBC 5.20 (H) 03/23/2024 1110   HGB 15.3 (H) 03/23/2024 1126   HCT 45.0 03/23/2024 1126   PLT 284 03/23/2024 1110   MCV 90.4 03/23/2024 1110   MCH 28.7 03/23/2024 1110   MCHC 31.7 03/23/2024 1110   RDW 14.1 03/23/2024 1110   LYMPHSABS 2.0 03/23/2024 1110   MONOABS 0.7 03/23/2024 1110    EOSABS 0.1 03/23/2024 1110   BASOSABS 0.1 03/23/2024 1110    BMET    Component Value Date/Time   NA 140 03/23/2024 1126   K 3.0 (L) 03/23/2024 1126   CL 102 03/23/2024 1126   CO2 21 (L) 03/23/2024 1110   GLUCOSE 97 03/23/2024 1126   BUN 18 03/23/2024 1126   CREATININE 1.40 (H) 03/23/2024 1126   CALCIUM  8.4 (L) 03/23/2024 1110   GFRNONAA 33 (L) 03/23/2024 1110    IMAGING past 24 hours ECHOCARDIOGRAM COMPLETE Result Date: 03/23/2024    ECHOCARDIOGRAM REPORT   Patient Name:   [name] Date of Exam: 03/23/2024 Medical Rec #:  [id number]     Height:       64.0 in Accession #:    [id number]    Weight:       205.9 lb Date of Birth:  05-21-1945    BSA:          1.981 m Patient Age:    78 years      BP:           130/72 mmHg Patient Gender: F             HR:           84 bpm. Exam Location:  Inpatient Procedure: 2D Echo, Cardiac Doppler and Color Doppler (Both Spectral and Color  Flow Doppler were utilized during procedure). Indications:    Stroke I63.9  History:        Patient has no prior history of Echocardiogram examinations.                 Risk Factors:Hypertension and Dyslipidemia.  Sonographer:    Merlynn Argyle Referring Phys: [id number] CORTNEY E DE LA TORRE IMPRESSIONS  1. Left ventricular ejection fraction, by estimation, is 60 to 65%. The left ventricle has normal function. The left ventricle has no regional wall motion abnormalities. Left ventricular diastolic parameters were normal.  2. Right ventricular systolic function is normal. The right ventricular size is mildly enlarged. There is normal pulmonary artery systolic pressure. The estimated right ventricular systolic pressure is 24.2 mmHg.  3. The mitral valve is grossly normal. Mild mitral valve regurgitation. No evidence of mitral stenosis.  4. The aortic valve is tricuspid. Aortic valve regurgitation is not visualized. Aortic valve sclerosis is present, with no evidence of aortic valve stenosis.  5. The inferior vena cava is  normal in size with greater than 50% respiratory variability, suggesting right atrial pressure of 3 mmHg. Comparison(s): No prior Echocardiogram. Conclusion(s)/Recommendation(s): No intracardiac source of embolism detected on this transthoracic study. Consider a transesophageal echocardiogram to exclude cardiac source of embolism if clinically indicated. FINDINGS  Left Ventricle: Left ventricular ejection fraction, by estimation, is 60 to 65%. The left ventricle has normal function. The left ventricle has no regional wall motion abnormalities. The left ventricular internal cavity size was normal in size. There is  no left ventricular hypertrophy. Left ventricular diastolic parameters were normal. Right Ventricle: The right ventricular size is mildly enlarged. Right vetricular wall thickness was not well visualized. Right ventricular systolic function is normal. There is normal pulmonary artery systolic pressure. The tricuspid regurgitant velocity  is 2.30 m/s, and with an assumed right atrial pressure of 3 mmHg, the estimated right ventricular systolic pressure is 24.2 mmHg. Left Atrium: Left atrial size was normal in size. Right Atrium: Right atrial size was normal in size. Pericardium: There is no evidence of pericardial effusion. Mitral Valve: The mitral valve is grossly normal. Mild mitral valve regurgitation. No evidence of mitral valve stenosis. MV peak gradient, 5.1 mmHg. The mean mitral valve gradient is 3.0 mmHg. Tricuspid Valve: The tricuspid valve is normal in structure. Tricuspid valve regurgitation is mild . No evidence of tricuspid stenosis. Aortic Valve: The aortic valve is tricuspid. Aortic valve regurgitation is not visualized. Aortic valve sclerosis is present, with no evidence of aortic valve stenosis. Pulmonic Valve: The pulmonic valve was not well visualized. Pulmonic valve regurgitation is trivial. No evidence of pulmonic stenosis. Aorta: The aortic root and ascending aorta are structurally  normal, with no evidence of dilitation. Venous: The inferior vena cava is normal in size with greater than 50% respiratory variability, suggesting right atrial pressure of 3 mmHg. IAS/Shunts: The atrial septum is grossly normal.  LEFT VENTRICLE PLAX 2D LVIDd:         3.60 cm   Diastology LVIDs:         2.60 cm   LV e' medial:  11.00 cm/s LV PW:         1.00 cm   LV e' lateral: 14.00 cm/s LV IVS:        0.90 cm LVOT diam:     1.70 cm LV SV:         28 LV SV Index:   14 LVOT Area:     2.27 cm LV  IVRT:       109 msec  RIGHT VENTRICLE             IVC RV Basal diam:  4.90 cm     IVC diam: 1.80 cm RV Mid diam:    4.00 cm RV S prime:     12.00 cm/s TAPSE (M-mode): 2.5 cm LEFT ATRIUM             Index        RIGHT ATRIUM           Index LA diam:        2.40 cm 1.21 cm/m   RA Area:     14.70 cm LA Vol (A2C):   51.9 ml 26.20 ml/m  RA Volume:   42.30 ml  21.36 ml/m LA Vol (A4C):   17.8 ml 8.99 ml/m LA Biplane Vol: 31.0 ml 15.65 ml/m  AORTIC VALVE             PULMONIC VALVE LVOT Vmax:   64.30 cm/s  PR End Diast Vel: 6.76 msec LVOT Vmean:  41.800 cm/s LVOT VTI:    0.124 m  AORTA Ao Root diam: 3.10 cm Ao Asc diam:  3.40 cm MITRAL VALVE             TRICUSPID VALVE MV Area VTI:  1.33 cm   TR Peak grad:   21.2 mmHg MV Peak grad: 5.1 mmHg   TR Vmax:        230.00 cm/s MV Mean grad: 3.0 mmHg MV Vmax:      1.13 m/s   SHUNTS MV Vmean:     84.8 cm/s  Systemic VTI:  0.12 m                          Systemic Diam: 1.70 cm Sunit Tolia Electronically signed by Madonna Large Signature Date/Time: 03/23/2024/4:25:15 PM    Final    EEG adult Result Date: 03/23/2024 Shelton Arlin KIDD, MD     03/23/2024  4:10 PM Patient Name: [name] MRN: [id number] Epilepsy Attending: Arlin KIDD Shelton Referring Physician/Provider: Merrianne Locus, MD Date: 03/23/2024 Duration: 36.55 mins Patient history: 78 y.o. female with hx of hypertension, osteoarthritis and memory loss who presents after sudden seizure activity and sudden onset left-sided weakness.  EEG to evaluate for seizure Level of alertness: Awake AEDs during EEG study: LEV, Ativan Technical aspects: This EEG study was done with scalp electrodes positioned according to the 10-20 International system of electrode placement. Electrical activity was reviewed with band pass filter of 1-70Hz , sensitivity of 7 uV/mm, display speed of 68mm/sec with a 60Hz  notched filter applied as appropriate. EEG data were recorded continuously and digitally stored.  Video monitoring was available and reviewed as appropriate. Description: The posterior dominant rhythm consists of 8-9Hz  activity of moderate voltage (25-35 uV) seen predominantly in posterior head regions, asymmetric( right<left) and reactive to eye opening and eye closing. Drowsiness was characterized by attenuation of the posterior background rhythm. EEG showed continuous 3 to 6 Hz theta-delta slowing in right hemisphere. Hyperventilation and photic stimulation were not performed.   ABNORMALITY - Continuous slow, right hemisphere. IMPRESSION: This study is suggestive of cortical dysfunction arising from right hemisphere likely secondary to underlying structural abnormality, post-ictal state. No seizures or epileptiform discharges were seen throughout the recording. Arlin KIDD Shelton   CT HEAD CODE STROKE WO CONTRAST Result Date: 03/23/2024 EXAM: CT HEAD WITHOUT CONTRAST 03/23/2024 11:18:09 AM TECHNIQUE: CT of the  head was performed without the administration of intravenous contrast. Automated exposure control, iterative reconstruction, and/or weight based adjustment of the mA/kV was utilized to reduce the radiation dose to as low as reasonably achievable. COMPARISON: Brain MRI 08/08/2007. Head CT 02/26/2023. CLINICAL HISTORY: 78 year old female with acute neuro deficit, stroke suspected. FINDINGS: BRAIN AND VENTRICLES: Small new round 5 to 6 mm subcortical white matter hypodensity at the right operculum most resembles a small parenchymal hemorrhage (sagittal  image 13). There does appear to be nearby cytotoxic edema in the frontal operculum including gray matter involvement on series 9 image 35. No other acute intracranial hemorrhage. No other cytotoxic edema identified. No hydrocephalus. No extra-axial collection. No intracranial mass effect or midline shift. Calcified atherosclerosis at the skull base. Advanced chronic bilateral cerebral white matter hypodensity, confluent in both cerebral hemispheres and with bilateral deep white matter capsule involvement. Less pronounced chronic heterogeneity in the deep gray nuclei. No convincing asymmetric vessel hyperdensity. No gaze deviation. ORBITS: No acute abnormality. SINUSES: No acute abnormality. SOFT TISSUES AND SKULL: No acute soft tissue abnormality. No skull fracture. [place] stroke program early CT score (aspects) ----- Ganglionic (caudate, ic, lentiform nucleus, insula, M1-m3): 7 Supraganglionic (m4-m6): 2 Total: 9 IMPRESSION: 1. Right opercular Small acute parenchymal hemorrhage (56 mm) with adjacent cytotoxic edema involving the frontal operculum gray matter. ASPECTS 9. 2. No intracranial mass effect or additional acute intracranial hemorrhage. Chronic small vessel disease. 3. These results were communicated to Dr. Lindzen at 1125 hours on 03/23/2024 by text page via the [facility] : [facility] messaging system. Electronically signed by: Helayne Hurst MD 03/23/2024 11:27 AM EST RP Workstation: [id number]    Vitals:   03/23/24 1400 03/23/24 1500 03/23/24 1600 03/23/24 1700  BP: 130/72 (!) 144/87 (!) 145/88 (!) 148/104  Pulse: 86 84 84 90  Resp: 19 14 (!) 22 20  Temp:      TempSrc:      SpO2: 100% 99% 97% 96%  Weight:       PHYSICAL EXAM General:  Alert, well-nourished, well-developed patient in no acute distress Psych:  Mood and affect appropriate for situation CV: Regular rate and rhythm on monitor Respiratory:  Regular, unlabored respirations on room air GI: Abdomen soft and non-tender  NEURO:  Mental Status:  AA. Disoriented to place. Able to select correct year when given choices. Reggie to identify her son at bedside. Knows her birthday and age.  Speech/Language: No aphasia.  Naming, repetition, fluency, and comprehension intact.  Cranial Nerves:  II: PERRL. Visual fields full.  III, IV, VI: EOMI. Eyelids elevate symmetrically.  V: Sensation is intact to light touch and symmetrical to face.  VII: Left facial droop VIII: hearing intact to voice. IX, X: Palate elevates symmetrically. Mild dysarthria.  KP:Dynloizm shrug 5/5. XII: tongue is midline without fasciculations. Motor:  RUE: 5/5 no drift LUE: 4/5, no drift LLE: 3/5, mild drift, pain with movement RLE 4/5, no drift  Tone: is normal and bulk is normal Sensation- Decreased to left.  Coordination: FTN intact bilaterally, HKS: no ataxia in BLE.No drift.  Gait- deferred  Most Recent NIH: 8.    ASSESSMENT/PLAN  [name] is a 78 y.o. female with hx of HTN, OA and recent cognitive decline who presented s/p witnessed GTC seizure at home followed by left-sided facial droop and left-sided hemiplegia.  She had another seizure en route and was given 5 mg of Versed.  She was noted to have rhythmic left neck and jaw twitching and was administered 2 mg of Ativan  in the ED and loaded with Keppra.  She has no known seizure risk factors.   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 ASPECTS 9. No intracranial mass effect or additional acute intracranial hemorrhage. Chronic small vessel disease. CTA head & neck  No large vessel occlusion, hemodynamically significant stenosis, or aneurysm in the head or neck. MRI   Demonstrating patchy gyriform enhancement within the abnormal right frontal operculum, but also confluent  suspicious enhancement in the right thalamus, 2 cm area series 17 image 16. Questionable also abnormal enhancement in the right superior frontal gyrus such as on sagittal postcontrast series 18 image 9, although axial images suggest this might be a developmental venous anomaly instead (benign). Despite absence of regional mass effect, the constellation raises the possibility of infiltrative primary tumor. MRI findings is more suspicious for infiltrative high grade tumor than subacute ischemia Recommend follow-up MRI in 2-3 months.  2D Echo  EF 60 to 65%, mildly enlarged right ventricle, mild MVR LDL No results found for requested labs within last 1095 days. HgbA1c No results found for requested labs within last 1095 days. 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 11/7 Stat EEG shows right hemisphere cortical dysfunction, likely postictal state with no seizures seen during recording 11/7 loaded with 3 g Keppra Continue on 500 mg Keppra Q12H maintenance dosing  Hypertension Home meds: Hydrochlorothiazide 12.5 mg daily, losartan 100 mg daily Hyzaar 1 tablet daily Labetalol and Hydralazine IVP PRNs Cleviprex gtt as needed Blood Pressure Goal: SBP between 130-150 for 24 hours and then less than 160   Hyperlipidemia Home meds: Crestor  5 mg daily, continue LDL 67, goal < 70  Dysphagia Patient has post-stroke dysphagia, SLP consulted    Diet   Diet NPO time specified   Advance diet as tolerated Continue IV fluids at 50 mL/hour MBS pending Possible need for CorTrak and tube feeds  Other Stroke Risk Factors Advanced Age Obesity, Body mass index is 35.34 kg/m., BMI >/= 30 associated with increased stroke risk, recommend weight loss, diet and exercise as appropriate   Other Active Problems AKI, Cr1.56-1.4-1.35 NS @  37ml/hr I/Os  Hospital day # 0   Pt seen by Neuro NP/APP with MD. Note/plan to be edited by MD as needed.    Rocky JAYSON Likes, DNP [facility] Please use AMION for contact information & EPIC for messaging.  I have personally obtained history,examined this patient, reviewed notes, independently viewed imaging studies, participated in medical decision making and plan of care.ROS completed by me personally and pertinent positives fully documented  I have made any additions or clarifications directly to the above note. Agree with note above.  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  Recommend continue Keppra for seizure prevention.  Patient will need close outpatient follow-up and repeat imaging and possible outpatient neurosurgical referral for consideration of biopsy if lesion does  not show expected lesion agree changes of  infarct   on follow-up imaging in 2 to 4 weeks.  Long discussion with patient and son at the bedside and answered questions.  Strict control of blood pressure with systolic goal 130-150 for the first 24 hours and then below 160.  Mobilize out of bed.  Therapy consults. This patient is critically ill and at significant risk of neurological worsening, death and care requires constant monitoring of vital signs, hemodynamics,respiratory and cardiac monitoring, extensive review of multiple databases, frequent neurological assessment, discussion with family, other specialists and medical decision making of high complexity.I have made any additions or clarifications directly to the above note.This  critical care time does not reflect procedure time, or teaching time or supervisory time of PA/NP/Med Resident etc but could involve care discussion time.  I spent 30 minutes of neurocritical care time  in the care of  this patient.     Eather Popp, MD Medical Director [facility] Pager: [phone] 03/24/2024 4:42 PM   To contact Stroke Continuity provider, please refer to [email or web address]. After hours, contact General Neurology

## 2024-03-24 DIAGNOSIS — I639 Cerebral infarction, unspecified: Secondary | ICD-10-CM | POA: Diagnosis not present

## 2024-03-24 DIAGNOSIS — G40409 Other generalized epilepsy and epileptic syndromes, not intractable, without status epilepticus: Secondary | ICD-10-CM | POA: Diagnosis not present

## 2024-03-24 DIAGNOSIS — R29708 NIHSS score 8: Secondary | ICD-10-CM

## 2024-03-24 DIAGNOSIS — I619 Nontraumatic intracerebral hemorrhage, unspecified: Secondary | ICD-10-CM | POA: Diagnosis not present

## 2024-03-24 DIAGNOSIS — D496 Neoplasm of unspecified behavior of brain: Secondary | ICD-10-CM | POA: Diagnosis not present

## 2024-03-24 DIAGNOSIS — G936 Cerebral edema: Secondary | ICD-10-CM

## 2024-03-24 DIAGNOSIS — I739 Peripheral vascular disease, unspecified: Secondary | ICD-10-CM

## 2024-03-24 DIAGNOSIS — I69391 Dysphagia following cerebral infarction: Secondary | ICD-10-CM

## 2024-03-24 LAB — COMPREHENSIVE METABOLIC PANEL WITH GFR
ALT: 15 U/L (ref 0–44)
AST: 35 U/L (ref 15–41)
Albumin: 2.9 g/dL — ABNORMAL LOW (ref 3.5–5.0)
Alkaline Phosphatase: 55 U/L (ref 38–126)
Anion gap: 14 (ref 5–15)
BUN: 11 mg/dL (ref 8–23)
CO2: 20 mmol/L — ABNORMAL LOW (ref 22–32)
Calcium: 8.3 mg/dL — ABNORMAL LOW (ref 8.9–10.3)
Chloride: 103 mmol/L (ref 98–111)
Creatinine, Ser: 1.35 mg/dL — ABNORMAL HIGH (ref 0.44–1.00)
GFR, Estimated: 40 mL/min — ABNORMAL LOW (ref >60)
Glucose, Bld: 72 mg/dL (ref 70–99)
Potassium: 5.1 mmol/L (ref 3.5–5.1)
Sodium: 137 mmol/L (ref 135–145)
Total Bilirubin: 1.8 mg/dL — ABNORMAL HIGH (ref 0.0–1.2)
Total Protein: 6.7 g/dL (ref 6.5–8.1)

## 2024-03-24 LAB — LIPID PANEL
Cholesterol: 128 mg/dL (ref 0–200)
HDL: 42 mg/dL (ref >40)
LDL Cholesterol: 67 mg/dL (ref 0–99)
Total CHOL/HDL Ratio: 3 ratio
Triglycerides: 96 mg/dL (ref <150)
VLDL: 19 mg/dL (ref 0–40)

## 2024-03-24 LAB — CBC
HCT: 44.9 % (ref 36.0–46.0)
Hemoglobin: 14.2 g/dL (ref 12.0–15.0)
MCH: 28.5 pg (ref 26.0–34.0)
MCHC: 31.6 g/dL (ref 30.0–36.0)
MCV: 90.2 fL (ref 80.0–100.0)
Platelets: 207 K/uL (ref 150–400)
RBC: 4.98 MIL/uL (ref 3.87–5.11)
RDW: 14 % (ref 11.5–15.5)
WBC: 7.2 K/uL (ref 4.0–10.5)
nRBC: 0.3 % — ABNORMAL HIGH (ref 0.0–0.2)

## 2024-03-24 LAB — HEMOGLOBIN A1C
Hgb A1c MFr Bld: 4.9 % (ref 4.8–5.6)
Mean Plasma Glucose: 93.93 mg/dL

## 2024-03-24 MED ORDER — HYDRALAZINE HCL 20 MG/ML IJ SOLN
5.0000 mg | INTRAMUSCULAR | Status: DC | PRN
  Administered 2024-03-24: 5 mg via INTRAVENOUS
  Filled 2024-03-24 (×2): qty 1

## 2024-03-24 MED ORDER — LABETALOL HCL 5 MG/ML IV SOLN
10.0000 mg | INTRAVENOUS | Status: DC | PRN
  Administered 2024-03-24 – 2024-03-27 (×4): 10 mg via INTRAVENOUS
  Filled 2024-03-24 (×4): qty 4

## 2024-03-24 NOTE — Progress Notes (Signed)
 Speech Language Pathology Treatment: Dysphagia  Patient Details Name: [name] MRN: [id number] DOB: 1946/02/17 Today's Date: 03/24/2024 Time: 8597-8579 SLP Time Calculation (min) (ACUTE ONLY): 18 min  Assessment / Plan / Recommendation Clinical Impression  Pt with fleeting alertness this date, which resulted in reduced performance during swallow tasks compared to what was observed during initial eval (11/7). Ice chips and tsp puree (x3 each) were administered, with pt exhibiting frequent oral holding, suspected delay in swallow initiation, overt coughing x1 and wet vocal quality. Pt without awareness to min puree residuals in L buccal cavity. She initiated a lingual sweep when cued, but ultimately SLP cleared with digit sweep. [name] fluctuating alertness and attention greatly impacted her progress this date and hopeful that she will have improvement in this area for completion of MBSS. Rad unable to schedule this date and will complete as their schedule allows.   Recommend continue NPO with ice chips after oral care when pt alert. RN and son, present in room, expressed agreement with plan and recommendations.     HPI HPI: 78 yo female with history of HTN, osteoarthritis, and memory loss who presents after sudden seizure activity with sudden onset L sided weakness. CTH shows R opercular acute parenchymal hemorrhage with adjacent cytotoxic edema.      SLP Plan  Continue with current plan of care          Recommendations  Diet recommendations: NPO (ice chips PRN when pt alert) Liquids provided via: Teaspoon Medication Administration: Crushed with puree Supervision: Full supervision/cueing for compensatory strategies;Staff to assist with self feeding Compensations: Minimize environmental distractions;Slow rate;Small sips/bites;Lingual sweep for clearance of pocketing Postural Changes and/or Swallow Maneuvers: Seated upright 90 degrees                  Oral care QID;Oral care  prior to ice chip/H20   Frequent or constant Supervision/Assistance Dysphagia, unspecified (R13.10)     Continue with current plan of care      Vanessa Kin, MA, CCC-SLP Acute Rehabilitation Services Office Number: [phone]  [name]  03/24/2024, 2:26 PM

## 2024-03-24 NOTE — Progress Notes (Signed)
 Per Stroke team to do another bedside swallow until SLP comes and does bedside trials

## 2024-03-24 NOTE — Progress Notes (Signed)
 PT Cancellation Note  Patient Details Name: [name] MRN: [id number] DOB: Aug 01, 1945   Cancelled Treatment:    Reason Eval/Treat Not Completed: Active bedrest order  Noted bedrest order until at least 11:38. Will monitor for appropriateness to proceed with PT.    [name], PT Acute Rehabilitation Services  Office [phone]  [name] 03/24/2024, 7:33 AM

## 2024-03-24 NOTE — Progress Notes (Signed)
 Stroke team- Rocky, NP notified Pt NIH went from 8 to a 10. Pt Hx of Dementia. Vital signs and neuro exam at baseline, see flowsheets. Stroke also notified of SLP plan for MBS study tmrw for Pt as she is to drowsy at bedside trial and failed this Rns bedside swallow screen. This RN is currently monitoring.

## 2024-03-24 NOTE — Progress Notes (Signed)
 Stroke, Dr. Rosemarie notified Pt BP trending up. This RN asked if Pt can be placed on BP regimen or get PRNs. See new orders. RN currently monitoring.

## 2024-03-24 NOTE — Progress Notes (Signed)
 Lab called this RN and stated they didn't have enough blood samples for a  light green top for this Pts 0500 labs and it was overlooked. This RN took over for Pt at 0900. Lab is adding a recollect for this Pt. Pt is a phlebotomy draw.

## 2024-03-24 NOTE — Evaluation (Signed)
 Physical Therapy Evaluation Patient Details Name: [name] MRN: [id number] DOB: 05/11/1946 Today's Date: 03/24/2024  History of Present Illness  78 yo female who presents 03/23/24 after sudden seizure activity with sudden onset L sided weakness. CTH shows R opercular acute parenchymal hemorrhage with adjacent cytotoxic edema. PMH- HTN, osteoarthritis, and memory loss  Clinical Impression   Pt admitted secondary to problem above with deficits below. PTA patient lives with son in one-level apartment with no steps to enter. She is alone while he works ~9:00am-9:00pm. She has had several falls (when she does not use her RW). She typically walks with RW except into bathroom.  Pt currently was lethargic/obtunded with great difficulty staying awake for evaluation. After supine assessment, pt's BP taken 169/103 and this was after RN had given meds to bring it down. Further mobility deferred due to BP parameter 130-150. Discussed current discharge plan of post-acute inpatient therapies <3 hrs/day with pt/son. Anticipate patient will benefit from PT to address problems listed below. Will continue to follow acutely to maximize functional mobility, independence, and safety.           If plan is discharge home, recommend the following: Two people to help with walking and/or transfers;Two people to help with bathing/dressing/bathroom;Direct supervision/assist for medications management;Direct supervision/assist for financial management;Assist for transportation;Help with stairs or ramp for entrance;Supervision due to cognitive status   Can travel by private vehicle   No    Equipment Recommendations Wheelchair (measurements PT);Wheelchair cushion (measurements PT);Hospital bed;Hoyer lift  Recommendations for Other Services  OT consult    Functional Status Assessment Patient has had a recent decline in their functional status and demonstrates the ability to make significant improvements in function in  a reasonable and predictable amount of time.     Precautions / Restrictions Precautions Precautions: Fall Recall of Precautions/Restrictions: Impaired      Mobility  Bed Mobility Overal bed mobility: Needs Assistance             General bed mobility comments: clinical judgement +2 total assist    Transfers                        Ambulation/Gait                  Stairs            Wheelchair Mobility     Tilt Bed    Modified Rankin (Stroke Patients Only) Modified Rankin (Stroke Patients Only) Pre-Morbid Rankin Score: Moderate disability Modified Rankin: Severe disability     Balance                                             Pertinent Vitals/Pain Pain Assessment Pain Assessment: Faces Faces Pain Scale: No hurt    Home Living Family/patient expects to be discharged to:: Private residence Living Arrangements: Children (son) Available Help at Discharge: Family;Available PRN/intermittently (pt alone 9a-9p each day) Type of Home: Apartment Home Access: Level entry       Home Layout: One level Home Equipment: Agricultural Consultant (2 wheels);Rollator (4 wheels)      Prior Function Prior Level of Function : Needs assist;History of Falls (last six months)             Mobility Comments: walks with RW, however parks it and goes without it at times; recent falls with each  time she did not have the RW with her ADLs Comments: modified independent basic ADLs; sponge bathes; memory deficits     Extremity/Trunk Assessment   Upper Extremity Assessment Upper Extremity Assessment: Defer to OT evaluation    Lower Extremity Assessment Lower Extremity Assessment: RLE deficits/detail;LLE deficits/detail;Difficult to assess due to impaired cognition RLE Deficits / Details: AAROM hip/knee flexion limited at 90/90 with grimacing; active DF WFL LLE Deficits / Details: PROM hip/knee flexion to 90/90; PROM ankle WFL     Cervical / Trunk Assessment Cervical / Trunk Assessment: Other exceptions Cervical / Trunk Exceptions: overweight  Communication   Communication Communication: Impaired Factors Affecting Communication: Reduced clarity of speech    Cognition Arousal: Obtunded Behavior During Therapy: WFL for tasks assessed/performed   PT - Cognitive impairments: History of cognitive impairments, Difficult to assess Difficult to assess due to: Level of arousal                       Following commands: Impaired Following commands impaired: Follows one step commands with increased time     Cueing Cueing Techniques: Verbal cues, Tactile cues     General Comments General comments (skin integrity, edema, etc.): Son present and provided home setup and pt's prior functional status. Patient easily drifting off to sleep. After supine assessment BP 165/103 and further activity deferred. RN had given meds just prior to PT session. RN made aware    Exercises     Assessment/Plan    PT Assessment Patient needs continued PT services  PT Problem List Decreased strength;Decreased activity tolerance;Decreased balance;Decreased mobility;Decreased cognition;Decreased knowledge of use of DME;Cardiopulmonary status limiting activity;Obesity;Impaired tone       PT Treatment Interventions DME instruction;Gait training;Functional mobility training;Therapeutic activities;Therapeutic exercise;Balance training;Neuromuscular re-education;Cognitive remediation;Patient/family education;Wheelchair mobility training    PT Goals (Current goals can be found in the Care Plan section)  Acute Rehab PT Goals Patient Stated Goal: unable to state due to lethargy PT Goal Formulation: With family Time For Goal Achievement: 04/07/24 Potential to Achieve Goals: Fair    Frequency Min 2X/week     Co-evaluation               AM-PAC PT 6 Clicks Mobility  Outcome Measure Help needed turning from your back to  your side while in a flat bed without using bedrails?: Total Help needed moving from lying on your back to sitting on the side of a flat bed without using bedrails?: Total Help needed moving to and from a bed to a chair (including a wheelchair)?: Total Help needed standing up from a chair using your arms (e.g., wheelchair or bedside chair)?: Total Help needed to walk in hospital room?: Total Help needed climbing 3-5 steps with a railing? : Total 6 Click Score: 6    End of Session   Activity Tolerance: Patient limited by lethargy Patient left: in bed;with call bell/phone within reach;with bed alarm set;with family/visitor present;with SCD's reapplied Nurse Communication: Other (comment) (elevated BP; lethargy) PT Visit Diagnosis: Repeated falls (R29.6);Muscle weakness (generalized) (M62.81);Difficulty in walking, not elsewhere classified (R26.2);Hemiplegia and hemiparesis Hemiplegia - Right/Left: Left Hemiplegia - dominant/non-dominant: Non-dominant Hemiplegia - caused by: Nontraumatic intracerebral hemorrhage    Time: 1250-1308 PT Time Calculation (min) (ACUTE ONLY): 18 min   Charges:   PT Evaluation $PT Eval Low Complexity: 1 Low   PT General Charges $$ ACUTE PT VISIT: 1 Visit          [name], PT Acute Rehabilitation Services  Office [phone]  [name] 03/24/2024, 1:23 PM

## 2024-03-25 DIAGNOSIS — R29706 NIHSS score 6: Secondary | ICD-10-CM

## 2024-03-25 DIAGNOSIS — G40409 Other generalized epilepsy and epileptic syndromes, not intractable, without status epilepticus: Secondary | ICD-10-CM | POA: Diagnosis not present

## 2024-03-25 DIAGNOSIS — I619 Nontraumatic intracerebral hemorrhage, unspecified: Secondary | ICD-10-CM | POA: Diagnosis not present

## 2024-03-25 DIAGNOSIS — D496 Neoplasm of unspecified behavior of brain: Secondary | ICD-10-CM | POA: Diagnosis not present

## 2024-03-25 LAB — MAGNESIUM: Magnesium: 1.8 mg/dL (ref 1.7–2.4)

## 2024-03-25 LAB — BASIC METABOLIC PANEL WITH GFR
Anion gap: 10 (ref 5–15)
BUN: 14 mg/dL (ref 8–23)
CO2: 21 mmol/L — ABNORMAL LOW (ref 22–32)
Calcium: 8.1 mg/dL — ABNORMAL LOW (ref 8.9–10.3)
Chloride: 103 mmol/L (ref 98–111)
Creatinine, Ser: 1.44 mg/dL — ABNORMAL HIGH (ref 0.44–1.00)
GFR, Estimated: 37 mL/min — ABNORMAL LOW (ref >60)
Glucose, Bld: 83 mg/dL (ref 70–99)
Potassium: 3.7 mmol/L (ref 3.5–5.1)
Sodium: 134 mmol/L — ABNORMAL LOW (ref 135–145)

## 2024-03-25 LAB — CBC
HCT: 37.2 % (ref 36.0–46.0)
Hemoglobin: 12.2 g/dL (ref 12.0–15.0)
MCH: 28.4 pg (ref 26.0–34.0)
MCHC: 32.8 g/dL (ref 30.0–36.0)
MCV: 86.5 fL (ref 80.0–100.0)
Platelets: 240 K/uL (ref 150–400)
RBC: 4.3 MIL/uL (ref 3.87–5.11)
RDW: 13.9 % (ref 11.5–15.5)
WBC: 8 K/uL (ref 4.0–10.5)
nRBC: 0 % (ref 0.0–0.2)

## 2024-03-25 MED ORDER — LOSARTAN POTASSIUM-HCTZ 50-12.5 MG PO TABS: 1.0000 | ORAL_TABLET | Freq: Every day | ORAL | Status: DC

## 2024-03-25 MED ORDER — LOSARTAN POTASSIUM 50 MG PO TABS: 50.0000 mg | ORAL_TABLET | Freq: Every day | ORAL | Status: DC

## 2024-03-25 MED ORDER — HEPARIN SODIUM (PORCINE) 5000 UNIT/ML IJ SOLN
5000.0000 [IU] | Freq: Three times a day (TID) | INTRAMUSCULAR | Status: DC
  Administered 2024-03-25 – 2024-03-27 (×7): 5000 [IU] via SUBCUTANEOUS
  Filled 2024-03-25 (×7): qty 1

## 2024-03-25 MED ORDER — AMLODIPINE BESYLATE 5 MG PO TABS
10.0000 mg | ORAL_TABLET | Freq: Every day | ORAL | Status: DC
  Administered 2024-03-25 – 2024-03-27 (×3): 10 mg via ORAL
  Filled 2024-03-25: qty 1
  Filled 2024-03-25: qty 2
  Filled 2024-03-25: qty 1

## 2024-03-25 MED ORDER — HYDROCHLOROTHIAZIDE 12.5 MG PO TABS: 12.5000 mg | ORAL_TABLET | Freq: Every day | ORAL | Status: DC

## 2024-03-25 NOTE — NC FL2 (Signed)
 [place]  MEDICAID FL2 LEVEL OF CARE FORM     IDENTIFICATION  Patient Name: [name] Birthdate: 1945-05-30 Sex: female Admission Date (Current Location): 03/23/2024  [facility] and [place] Number:  Producer, Television/film/video and Address:  [facility], 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   Pneumonia due to COVID-19 virus 02/26/2019   Respiratory failure (HCC)/ hypoxic 02/26/2019   Dyslipidemia 02/26/2019   Bunion 08/21/2014   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   Onychomycosis 02/28/2013   Pain, foot 02/28/2013   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   Essential hypertension 05/31/2012   GERD (gastroesophageal reflux disease) 05/31/2012   Osteoarthritis of knee, unspecified 04/25/2012    Orientation RESPIRATION BLADDER Height & Weight     Self, Time  Normal Incontinent, External catheter (External Urinary Catheter) Weight: 205 lb 14.6 oz (93.4 kg) Height:     BEHAVIORAL SYMPTOMS/MOOD NEUROLOGICAL BOWEL NUTRITION STATUS      Incontinent Diet (Please see discharge summary)  AMBULATORY STATUS COMMUNICATION OF NEEDS Skin   Extensive Assist Verbally (difficulty speaking) Other (Comment) (Erythema,arm,leg,Buttocks,Bil.,Wound/Incision LDAs,Wound Infiltration/Extra vasation,arm,anterior,L,Lower)                       Personal Care Assistance Level of Assistance  Bathing, Feeding, Dressing Bathing  Assistance: Maximum assistance Feeding assistance: Maximum assistance Dressing Assistance: Maximum assistance     Functional Limitations Info  Sight, Hearing, Speech Sight Info: Adequate Hearing Info: Adequate Speech Info:  (difficulty speaking)    SPECIAL CARE FACTORS FREQUENCY  PT (By licensed PT), OT (By licensed OT)     PT Frequency: 5x min weekly OT Frequency: 5x min weekly            Contractures Contractures Info: Not present    Additional Factors Info  Code Status, Allergies, Psychotropic Code Status Info: FULL Allergies Info: Shellfish Protein-containing Drug Products,Carisoprodol-aspirin -codeine,Codeine,Doxycycline,Tetracycline Psychotropic Info: donepezil (ARICEPT) tablet 10 mg daily,levETIRAcetam (KEPPRA) undiluted injection 500 mg every 12 hours         Current Medications (03/25/2024):  This is the current hospital active medication list Current Facility-Administered Medications  Medication Dose Route Frequency Provider Last Rate Last Admin   0.9 %  sodium chloride  infusion   Intravenous Continuous de Clint Kill, Cortney E, NP 50 mL/hr at 03/25/24 0600 Infusion Verify at 03/25/24 0600   acetaminophen  (TYLENOL ) tablet 650 mg  650 mg Oral Q4H PRN de Clint Kill, Cortney E, NP   650 mg at 03/25/24 0750   Or   acetaminophen  (TYLENOL ) 160 MG/5ML solution 650 mg  650 mg Per Tube Q4H PRN de Clint Kill, Cortney E, NP       Or   acetaminophen  (TYLENOL ) suppository 650 mg  650 mg Rectal Q4H PRN de Clint Kill, Cortney E, NP       amLODipine (NORVASC) tablet 10 mg  10 mg Oral Daily Lehner, Erin C, NP   10 mg at 03/25/24 1118  Chlorhexidine Gluconate Cloth 2 % PADS 6 each  6 each Topical Daily Sethi, Pramod S, MD   6 each at 03/25/24 1000   clevidipine (CLEVIPREX) infusion 0.5 mg/mL  0-21 mg/hr Intravenous Continuous de Clint Kill, Cortney E, NP   Stopped at 03/24/24 1418   donepezil (ARICEPT) tablet 10 mg  10 mg Oral Daily de Clint Kill, Cortney E, NP   10 mg at 03/25/24 1000    heparin injection 5,000 Units  5,000 Units Subcutaneous Q8H Lehner, Erin C, NP       hydrALAZINE (APRESOLINE) injection 5 mg  5 mg Intravenous Q4H PRN Lehner, Erin C, NP   5 mg at 03/24/24 1331   labetalol (NORMODYNE) injection 10 mg  10 mg Intravenous Q2H PRN Lehner, Erin C, NP   10 mg at 03/24/24 1621   levETIRAcetam (KEPPRA) undiluted injection 500 mg  500 mg Intravenous Q12H de Clint Kill, Cortney E, NP   500 mg at 03/25/24 1120   Oral care mouth rinse  15 mL Mouth Rinse PRN Lindzen, Eric, MD       pantoprazole  (PROTONIX ) injection 40 mg  40 mg Intravenous QHS de Clint Kill, Cortney E, NP   40 mg at 03/24/24 2210     Discharge Medications: Please see discharge summary for a list of discharge medications.  Relevant Imaging Results:  Relevant Lab Results:   Additional Information [id number]  Isaiah Public, LCSWA

## 2024-03-25 NOTE — Evaluation (Signed)
 Occupational Therapy Evaluation Patient Details Name: [name] MRN: [id number] DOB: 08/28/1945 Today's Date: 03/25/2024   History of Present Illness   78 yo female who presents 03/23/24 after sudden seizure activity with sudden onset L sided weakness. CTH shows R opercular acute parenchymal hemorrhage with adjacent cytotoxic edema. PMH- HTN, osteoarthritis, and memory loss     Clinical Impressions Patient admitted for the diagnosis above.  PTA she lived at home and, per family, was sedentary, sponge bathed at baseline, and did rely on family for iADL and meal support.  Deficits impacting function are listed below, currently needing up to Max A for ADL completion at bedlevel, and +2 to stand at bedside.  OT will continue efforts in the acute setting and Patient will benefit from continued inpatient follow up therapy, <3 hours/day.     If plan is discharge home, recommend the following:   Assist for transportation;Assistance with cooking/housework;Two people to help with walking and/or transfers;A lot of help with bathing/dressing/bathroom     Functional Status Assessment   Patient has had a recent decline in their functional status and demonstrates the ability to make significant improvements in function in a reasonable and predictable amount of time.     Equipment Recommendations   Wheelchair (measurements OT);Wheelchair cushion (measurements OT);BSC/3in1     Recommendations for Other Services         Precautions/Restrictions   Precautions Precautions: Fall Recall of Precautions/Restrictions: Impaired Restrictions Weight Bearing Restrictions Per Provider Order: No     Mobility Bed Mobility Overal bed mobility: Needs Assistance Bed Mobility: Rolling Rolling: Max assist           Patient Response: Cooperative  Transfers Overall transfer level: Needs assistance Equipment used: Rolling walker (2 wheels) Transfers: Sit to/from Stand Sit to Stand: Mod  assist, +2 physical assistance                  Balance Overall balance assessment: Needs assistance Sitting-balance support: Feet supported Sitting balance-Leahy Scale: Fair   Postural control: Left lateral lean Standing balance support: Reliant on assistive device for balance, Bilateral upper extremity supported Standing balance-Leahy Scale: Poor                             ADL either performed or assessed with clinical judgement   ADL Overall ADL's : Needs assistance/impaired Eating/Feeding: NPO   Grooming: Wash/dry face;Wash/dry hands;Minimal assistance;Bed level   Upper Body Bathing: Moderate assistance;Bed level   Lower Body Bathing: Maximal assistance;Bed level   Upper Body Dressing : Moderate assistance;Bed level   Lower Body Dressing: Maximal assistance;Bed level     Toilet Transfer Details (indicate cue type and reason): Mod A of 2 to stand with bed egress. Toileting- Clothing Manipulation and Hygiene: Total assistance;Bed level               Vision   Vision Assessment?: No apparent visual deficits     Perception Perception: Within Functional Limits       Praxis Praxis: WFL       Pertinent Vitals/Pain Pain Assessment Pain Assessment: No/denies pain     Extremity/Trunk Assessment Upper Extremity Assessment Upper Extremity Assessment: Right hand dominant;LUE deficits/detail LUE Deficits / Details: Grossly 2+/5 for MMT.   Weaker proximally.  Able to touch her chin, but struggles with touching her nose. LUE Sensation: decreased light touch LUE Coordination: decreased fine motor;decreased gross motor   Lower Extremity Assessment Lower Extremity Assessment: Defer to  PT evaluation   Cervical / Trunk Assessment Cervical / Trunk Assessment: Other exceptions Cervical / Trunk Exceptions: overweight   Communication Communication Communication: Impaired Factors Affecting Communication: Reduced clarity of speech   Cognition  Arousal: Alert Behavior During Therapy: WFL for tasks assessed/performed Cognition: No apparent impairments             OT - Cognition Comments: ST memory deficits at baseline                 Following commands: Impaired Following commands impaired: Follows one step commands with increased time     Cueing  General Comments   Cueing Techniques: Verbal cues;Tactile cues      Exercises     Shoulder Instructions      Home Living Family/patient expects to be discharged to:: Private residence Living Arrangements: Children Available Help at Discharge: Family;Available PRN/intermittently Type of Home: Apartment Home Access: Level entry     Home Layout: One level     Bathroom Shower/Tub: Tub/shower unit;Sponge bathes at baseline   Bathroom Toilet: Standard Bathroom Accessibility: No   Home Equipment: Agricultural Consultant (2 wheels);Rollator (4 wheels)          Prior Functioning/Environment Prior Level of Function : Needs assist;History of Falls (last six months)             Mobility Comments: very sedentary at baseline; walks with RW, however parks it and goes without it at times; recent falls with each time she did not have the RW with her ADLs Comments: modified independent basic ADLs; sponge bathes; memory deficits    OT Problem List: Decreased strength;Decreased range of motion;Decreased activity tolerance;Impaired balance (sitting and/or standing);Decreased coordination;Impaired sensation;Impaired UE functional use   OT Treatment/Interventions: Self-care/ADL training;Therapeutic activities;Therapeutic exercise;Patient/family education;Balance training;DME and/or AE instruction      OT Goals(Current goals can be found in the care plan section)   Acute Rehab OT Goals Patient Stated Goal: Return home OT Goal Formulation: With patient Time For Goal Achievement: 04/09/24 Potential to Achieve Goals: Good ADL Goals Pt Will Perform Grooming: with  set-up;sitting Pt Will Perform Upper Body Dressing: with supervision;sitting Pt Will Perform Lower Body Dressing: with mod assist;sitting/lateral leans Pt Will Transfer to Toilet: with mod assist;stand pivot transfer;bedside commode Pt/caregiver will Perform Home Exercise Program: Increased ROM;Increased strength;Left upper extremity;With minimal assist   OT Frequency:  Min 2X/week    Co-evaluation PT/OT/SLP Co-Evaluation/Treatment: Yes Reason for Co-Treatment: Complexity of the patient's impairments (multi-system involvement) PT goals addressed during session: Mobility/safety with mobility;Balance;Proper use of DME OT goals addressed during session: ADL's and self-care      AM-PAC OT 6 Clicks Daily Activity     Outcome Measure Help from another person eating meals?: Total Help from another person taking care of personal grooming?: A Little Help from another person toileting, which includes using toliet, bedpan, or urinal?: A Lot Help from another person bathing (including washing, rinsing, drying)?: A Lot Help from another person to put on and taking off regular upper body clothing?: A Lot Help from another person to put on and taking off regular lower body clothing?: A Lot 6 Click Score: 12   End of Session Equipment Utilized During Treatment: Gait belt;Rolling walker (2 wheels) Nurse Communication: Mobility status  Activity Tolerance: Patient tolerated treatment well Patient left: in bed;with call bell/phone within reach;with bed alarm set;with family/visitor present  OT Visit Diagnosis: Unsteadiness on feet (R26.81);Muscle weakness (generalized) (M62.81);Hemiplegia and hemiparesis Hemiplegia - Right/Left: Left Hemiplegia - dominant/non-dominant: Non-Dominant  Time: 9074-9049 OT Time Calculation (min): 25 min Charges:  OT General Charges $OT Visit: 1 Visit OT Evaluation $OT Eval Moderate Complexity: 1 Mod  03/25/2024  RP, OTR/L  Acute Rehabilitation  Services  Office:  [phone]   Charlie JONETTA Halsted 03/25/2024, 10:07 AM

## 2024-03-25 NOTE — TOC Initial Note (Signed)
 Transition of Care [facility]) - Initial/Assessment Note    Patient Details  Name: [name] MRN: [id number] Date of Birth: 09/10/1945  [facility]) CM/SW Contact:    Isaiah Public, LCSWA Phone Number: 03/25/2024, 11:31 AM  Clinical Narrative:                  CSW received consult for possible SNF placement at time of discharge. Due to patients current orientation, CSW spoke with patients son [name] regarding PT recommendation of SNF placement at time of discharge. Patients son reports PTA patient comes from home with brother.Patients son expressed understanding of PT recommendation and is agreeable to SNF placement for patient at time of discharge. Patients son gave CSW permission to fax out initial referral for SNF placement.Patients son reports preference for [facility] .CSW discussed insurance authorization process and will provide Medicare SNF ratings list with accepted SNF bed offers when available. All questions answered. No further questions reported at this time. CSW to continue to follow and assist with discharge planning needs.   Expected Discharge Plan: Skilled Nursing Facility Barriers to Discharge: Continued Medical Work up   Patient Goals and CMS Choice     Choice offered to / list presented to : Adult Children (Patients son Gaylen)      Expected Discharge Plan and Services In-house Referral: Clinical Social Work     Living arrangements for the past 2 months: Apartment                                      Prior Living Arrangements/Services Living arrangements for the past 2 months: Apartment Lives with:: Siblings (brother) Patient language and need for interpreter reviewed:: Yes        Need for Family Participation in Patient Care: Yes (Comment) Care giver support system in place?: Yes (comment)   Criminal Activity/Legal Involvement Pertinent to Current Situation/Hospitalization: No - Comment as needed  Activities of Daily Living       Permission Sought/Granted Permission sought to share information with : Case Manager, Magazine Features Editor, Family Supports                Emotional Assessment       Orientation: : Oriented to Self, Oriented to  Time Alcohol / Substance Use: Not Applicable Psych Involvement: No (comment)  Admission diagnosis:  Hemorrhagic stroke (HCC) [I61.9] Stroke, hemorrhagic (HCC) [I61.9] Patient Active Problem List   Diagnosis Date Noted   Stroke, 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   Pneumonia due to COVID-19 virus 02/26/2019   Respiratory failure (HCC)/ hypoxic 02/26/2019   Dyslipidemia 02/26/2019   Bunion 08/21/2014   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   Onychomycosis 02/28/2013   Pain, foot 02/28/2013   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   Essential hypertension 05/31/2012   GERD (gastroesophageal reflux disease) 05/31/2012   Osteoarthritis of knee, unspecified 04/25/2012   PCP:  Hillman Bare, MD Pharmacy:   [facility] [phone] - [place], [place] - [address] AT [address] [place] [address] Phone: [phone] Fax: [phone]     Social Drivers of Health (SDOH) Social History: SDOH Screenings   Food Insecurity: No Food Insecurity (03/23/2024)  Housing: Low Risk  (03/23/2024)  Transportation Needs: No Transportation Needs (03/23/2024)  Utilities: Not At Risk (03/23/2024)  Social Connections: Moderately Integrated (03/23/2024)  Tobacco Use: Low Risk  (  08/11/2023)   SDOH Interventions:     Readmission Risk Interventions     No data to display

## 2024-03-25 NOTE — Progress Notes (Addendum)
 Speech Language Pathology Treatment: Dysphagia  Patient Details Name: [name] MRN: [id number] DOB: May 03, 1946 Today's Date: 03/25/2024 Time: 9244-9189 SLP Time Calculation (min) (ACUTE ONLY): 15 min  Assessment / Plan / Recommendation Clinical Impression  She is demonstrating readiness to complete an MBS, though radiology's ability to schedule is yet to be determined. Continue to offer ice chips in moderation with frequent oral care and meds crushed with puree. Will f/u as able.    [name] alertness is improved compared to previous date, more consistent with initial evaluation. RN reports this fluctuates significantly throughout the day. She continues to demonstrate reduced safety awareness and is eager for POs. Forceful coughing follows sips of thin liquids. Although generally unaware of L buccal pocketing, she uses a lingual sweep to clear it with Min cueing.    Addendum 1159: Per staff, pt continues to ask for POs repeatedly. Returned to her room to visit with pt and her son to discuss options since radiology's scheduling does not allow for an MBS today. Discussed proceeding with a FEES, which pt adamantly declined.    HPI HPI: 78 yo female with history of HTN, osteoarthritis, and memory loss who presents after sudden seizure activity with sudden onset L sided weakness. CTH shows R opercular acute parenchymal hemorrhage with adjacent cytotoxic edema. 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       SLP Plan  MBS          Recommendations  Diet recommendations: NPO Medication Administration: Crushed with puree Supervision: Full supervision/cueing for compensatory strategies;Staff to assist with self feeding Compensations: Minimize environmental distractions;Slow rate;Small sips/bites;Lingual sweep for clearance of pocketing Postural Changes and/or Swallow Maneuvers: Seated upright 90 degrees                   Oral care QID;Oral care prior to ice chip/H20   Frequent or constant Supervision/Assistance Dysphagia, unspecified (R13.10);Dysarthria and anarthria (R47.1);Cognitive communication deficit (M58.158)     MBS     Damien Blumenthal, M.A., CCC-SLP Speech Language Pathology, Acute Rehabilitation Services  Secure Chat preferred [phone]   03/25/2024, 8:21 AM

## 2024-03-25 NOTE — Progress Notes (Addendum)
 STROKE TEAM PROGRESS NOTE    SIGNIFICANT HOSPITAL EVENTS  11/7: Presented s/p fall with seizure with left gaze deviation and left-sided weakness. Had another seizure en route, given 5mg  Versed by EMS.  CTH showed small right IPH with adjacent cytotoxic edema MRI suspicious for infiltrative high-grade tour as opposed to subacute ischemia.    INTERIM HISTORY/SUBJECTIVE  Son at bedside. Patient is sitting up, more alert today. LUE mild drift. LLE no drift, but mild weakness. L facial droop, somewhat improved.Mild dysarthria.   Pending MBS today   BP goal now < 160. IVP PRNs and Cleviprex as needed. On exam, BP within goal range without use of gtts.   OBJECTIVE  CBC    Component Value Date/Time   WBC 8.0 03/25/2024 0424   RBC 4.30 03/25/2024 0424   HGB 12.2 03/25/2024 0424   HCT 37.2 03/25/2024 0424   PLT 240 03/25/2024 0424   MCV 86.5 03/25/2024 0424   MCH 28.4 03/25/2024 0424   MCHC 32.8 03/25/2024 0424   RDW 13.9 03/25/2024 0424   LYMPHSABS 2.0 03/23/2024 1110   MONOABS 0.7 03/23/2024 1110   EOSABS 0.1 03/23/2024 1110   BASOSABS 0.1 03/23/2024 1110    BMET    Component Value Date/Time   NA 134 (L) 03/25/2024 0424   K 3.7 03/25/2024 0424   CL 103 03/25/2024 0424   CO2 21 (L) 03/25/2024 0424   GLUCOSE 83 03/25/2024 0424   BUN 14 03/25/2024 0424   CREATININE 1.44 (H) 03/25/2024 0424   CALCIUM  8.1 (L) 03/25/2024 0424   GFRNONAA 37 (L) 03/25/2024 0424    IMAGING past 24 hours No results found.   Vitals:   03/25/24 0400 03/25/24 0500 03/25/24 0600 03/25/24 0800  BP: (!) 124/53 124/67 133/71   Pulse: 64 68 68   Resp: 20 (!) 30 20   Temp: 99 F (37.2 C)   (!) 100.4 F (38 C)  TempSrc: Axillary   Oral  SpO2: 93% 96% 95%   Weight:       PHYSICAL EXAM General:  Alert, well-nourished, well-developed patient in no acute distress Psych:  Mood and affect appropriate for situation CV: Regular rate and rhythm on monitor Respiratory:  Regular, unlabored  respirations on room air GI: Abdomen soft and non-tender  NEURO:  Mental Status: AA. Disoriented to place. Able to select correct year when given choices. Able to identify her son at bedside. Knows her birthday and age. Speech/Language: No aphasia.  Naming, repetition, fluency, and comprehension intact.  Cranial Nerves:  II: PERRL. Visual fields full.  III, IV, VI: EOMI. Tracks bilaterally. Eyelids elevate symmetrically.  V: Sensation is intact to light touch and symmetrical to face.  VII: Left mild facial droop VIII: hearing intact to voice. IX, X: Palate elevates symmetrically. Mild dysarthria.  KP:Dynloizm shrug 5/5. XII: tongue is midline  Motor:  RUE: 5/5 no drift LUE: 4+/5, drift to bed beyond 10 seconds LLE: distal 4-/5, prox 2/5, mild drift, pain with movement RLE distal 3/5, prox 4-/5, no drift  Tone: is normal and bulk is normal Sensation- Decreased to left. Sensory neglect on DSS.  Coordination: FTN intact bilaterally, HKS: no ataxia in BLE.No drift.  Gait- deferred  Most Recent NIH: 6.   ASSESSMENT/PLAN  Ms. SHUNTE SENSENEY is a 78 y.o. female with hx of HTN, OA and recent cognitive decline who presented s/p witnessed GTC seizure at home followed by left-sided facial droop and left-sided hemiplegia.  She had another seizure en route and was given  5 mg of Versed.  She was noted to have rhythmic left neck and jaw twitching and was administered 2 mg of Ativan in the ED and loaded with Keppra.  She has no known seizure risk factors.   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 CTA head & neck No large vessel occlusion, hemodynamically significant stenosis, or aneurysm in the head or neck. MRI  Demonstrating patchy gyriform enhancement within the  abnormal right frontal operculum, but also confluent suspicious enhancement in the right thalamus. Questionable also abnormal enhancement in the right superior frontal gyrus, although axial images suggest this might be a developmental venous anomaly instead (benign). Despite absence of regional mass effect, the constellation raises the possibility of infiltrative primary tumor. Pending Neurosurgery consult Recommend follow-up MRI in 2-3 months.  2D Echo EF 60 to 65%, mildly enlarged right ventricle, mild MVR LDL 67 HgbA1c 4.9 VTE prophylaxis - heparin subcu aspirin  81 mg daily prior to admission, now on No antithrombotic due to IPH and likely brain tumor Therapy recommendations:  SNF Disposition: Pending  Seizure Seizure with EMS and in ED 11/7 Stat EEG shows right hemisphere cortical dysfunction, likely postictal state with no seizures seen during recording 11/7 loaded with 3 g Keppra Continue on 500 mg Keppra Q12H maintenance dosing  Hypertension Home meds: Hyzaar 50-12.5 daily Labetalol and Hydralazine IVP PRNs Off Cleviprex gtt now On amlodipine 10 BP goal: SBP < 160 Long-term BP goal normotensive  Hyperlipidemia Home meds: Crestor  20 mg daily LDL 67, goal < 70 Resume statin on discharge  Dysphagia Patient has post-stroke dysphagia, SLP consulted Continue IV fluids at 50 mL/hour MBS pending Core Trak if not able to pass swallow tomorrow  Other Stroke Risk Factors Advanced Age Obesity, Body mass index is 35.34 kg/m., BMI >/= 30 associated with increased stroke risk, recommend weight loss, diet and exercise as appropriate   Other Active Problems AKI, Cr1.56-1.4-1.35-1.44, on IV fluid Cognitive impairment, on Aricept  Hospital day # 2   Pt seen by Neuro NP/APP with MD. Note/plan to be edited by MD as needed.    Rocky JAYSON Likes, DNP [facility] Please use AMION for contact information & EPIC for messaging.  ATTENDING NOTE: I reviewed above note and  agree with the assessment and plan. Pt was seen and examined.   Son is at the bedside. Pt is awake, alert, slight lethargic, eyes open, orientated to age, place, time and people. No aphasia, mild dysarthria, following all simple commands. Able to name 3/4 and repeat simple sentences in mildly dysarthric voice. No gaze palsy, tracking bilaterally, visual field full, PERRL. L mild facial droop. Tongue midline. LUE drift to bed beyond 10 sec, RUE no drift. LLE proximal 2/5 but distal 4/5. RLE proximal 3/5, distal 5/5. Sensation decreased on the left with left sensory neglect on DSS, b/l FTN intact but slow on the left, gait not tested.   For detailed assessment and plan, please refer to above as I have made changes wherever appropriate.   Ary Cummins, MD PhD Stroke Neurology 03/25/2024 6:23 PM  This patient is critically ill due to brain tumor versus stroke with hemorrhagic transformation, seizure and at significant risk of neurological worsening, death form recurrent stroke, cerebral edema, status epilepticus. This patient's care requires constant monitoring of vital signs, hemodynamics, respiratory and cardiac monitoring, review  of multiple databases, neurological assessment, discussion with family, other specialists and medical decision making of high complexity. I spent 45 minutes of neurocritical care time in the care of this patient. I had long discussion with son at bedside, updated pt current condition, treatment plan and potential prognosis, and answered all the questions.  He expressed understanding and appreciation.       To contact Stroke Continuity provider, please refer to [email or web address]. After hours, contact General Neurology

## 2024-03-25 NOTE — Progress Notes (Signed)
 Physical Therapy Treatment Patient Details Name: [name] MRN: [id number] DOB: 10/05/1945 Today's Date: 03/25/2024   History of Present Illness 78 yo female who presents 03/23/24 after sudden seizure activity with sudden onset L sided weakness. CTH shows R opercular acute parenchymal hemorrhage with adjacent cytotoxic edema. PMH- HTN, osteoarthritis, and memory loss    PT Comments  Patient much more alert today. Able to wiggle left foot and initiate raising it off the bed. Progressed to sitting via egress with bed functions. Pt required +2 max assist to scoot hips forward to reach feet to floor. Stood twice with +2 mod assist with slight posterior bias and no knee buckling on RLE. Pivot to chair deferred as pt to go down for swallow study.     If plan is discharge home, recommend the following: Two people to help with walking and/or transfers;Two people to help with bathing/dressing/bathroom;Direct supervision/assist for medications management;Direct supervision/assist for financial management;Assist for transportation;Help with stairs or ramp for entrance;Supervision due to cognitive status   Can travel by private vehicle     No  Equipment Recommendations  Wheelchair (measurements PT);Wheelchair cushion (measurements PT);Hospital bed;Hoyer lift    Recommendations for Other Services OT consult     Precautions / Restrictions Precautions Precautions: Fall Recall of Precautions/Restrictions: Impaired Restrictions Weight Bearing Restrictions Per Provider Order: No     Mobility  Bed Mobility Overal bed mobility: Needs Assistance             General bed mobility comments: up to egress position with ICU bed    Transfers Overall transfer level: Needs assistance Equipment used: Rolling walker (2 wheels), 2 person hand held assist Transfers: Sit to/from Stand Sit to Stand: Mod assist, +2 physical assistance           General transfer comment: stood initially with knees  blocked and pt holding onto therapist's arms with slight hip flexion and posterior bias. Stood again with RW and still with posterior bias    Ambulation/Gait                   Optometrist     Tilt Bed    Modified Rankin (Stroke Patients Only) Modified Rankin (Stroke Patients Only) Pre-Morbid Rankin Score: Moderate disability Modified Rankin: Severe disability     Balance Overall balance assessment: Needs assistance Sitting-balance support: Feet supported Sitting balance-Leahy Scale: Fair   Postural control: Left lateral lean Standing balance support: Reliant on assistive device for balance, Bilateral upper extremity supported Standing balance-Leahy Scale: Poor                              Communication Communication Communication: Impaired Factors Affecting Communication: Reduced clarity of speech  Cognition Arousal: Alert Behavior During Therapy: WFL for tasks assessed/performed   PT - Cognitive impairments: History of cognitive impairments, Difficult to assess                         Following commands: Impaired Following commands impaired: Follows one step commands with increased time    Cueing Cueing Techniques: Verbal cues, Tactile cues, Gestural cues  Exercises      General Comments General comments (skin integrity, edema, etc.): Second son present. Discussed discharge plan      Pertinent Vitals/Pain Pain Assessment Pain Assessment: No/denies pain    Home Living Family/patient expects to be  discharged to:: Private residence Living Arrangements: Children Available Help at Discharge: Family;Available PRN/intermittently Type of Home: Apartment Home Access: Level entry       Home Layout: One level Home Equipment: Agricultural Consultant (2 wheels);Rollator (4 wheels)      Prior Function            PT Goals (current goals can now be found in the care plan section) Acute Rehab PT  Goals Patient Stated Goal: agrees with goals PT Goal Formulation: With family Time For Goal Achievement: 04/07/24 Potential to Achieve Goals: Fair Progress towards PT goals: Progressing toward goals    Frequency    Min 2X/week      PT Plan      Co-evaluation PT/OT/SLP Co-Evaluation/Treatment: Yes Reason for Co-Treatment: Complexity of the patient's impairments (multi-system involvement) PT goals addressed during session: Mobility/safety with mobility;Balance;Proper use of DME OT goals addressed during session: ADL's and self-care      AM-PAC PT 6 Clicks Mobility   Outcome Measure  Help needed turning from your back to your side while in a flat bed without using bedrails?: Total Help needed moving from lying on your back to sitting on the side of a flat bed without using bedrails?: Total Help needed moving to and from a bed to a chair (including a wheelchair)?: Total Help needed standing up from a chair using your arms (e.g., wheelchair or bedside chair)?: Total Help needed to walk in hospital room?: Total Help needed climbing 3-5 steps with a railing? : Total 6 Click Score: 6    End of Session Equipment Utilized During Treatment: Gait belt Activity Tolerance: Patient limited by fatigue Patient left: in bed;with call bell/phone within reach;with family/visitor present;with SCD's reapplied Nurse Communication: Mobility status PT Visit Diagnosis: Repeated falls (R29.6);Muscle weakness (generalized) (M62.81);Difficulty in walking, not elsewhere classified (R26.2);Hemiplegia and hemiparesis Hemiplegia - Right/Left: Left Hemiplegia - dominant/non-dominant: Non-dominant Hemiplegia - caused by: Nontraumatic intracerebral hemorrhage     Time: 0917-0943 PT Time Calculation (min) (ACUTE ONLY): 26 min  Charges:    $Therapeutic Activity: 8-22 mins PT General Charges $$ ACUTE PT VISIT: 1 Visit                      [name], PT Acute Rehabilitation Services  Office  [phone]    [name] 03/25/2024, 10:48 AM

## 2024-03-26 ENCOUNTER — Other Ambulatory Visit: Payer: Self-pay | Admitting: *Deleted

## 2024-03-26 ENCOUNTER — Inpatient Hospital Stay (HOSPITAL_COMMUNITY)

## 2024-03-26 ENCOUNTER — Other Ambulatory Visit: Payer: Self-pay | Admitting: Radiation Therapy

## 2024-03-26 DIAGNOSIS — D496 Neoplasm of unspecified behavior of brain: Secondary | ICD-10-CM

## 2024-03-26 DIAGNOSIS — I619 Nontraumatic intracerebral hemorrhage, unspecified: Secondary | ICD-10-CM | POA: Diagnosis not present

## 2024-03-26 DIAGNOSIS — G40409 Other generalized epilepsy and epileptic syndromes, not intractable, without status epilepticus: Secondary | ICD-10-CM | POA: Diagnosis not present

## 2024-03-26 DIAGNOSIS — R29706 NIHSS score 6: Secondary | ICD-10-CM | POA: Diagnosis not present

## 2024-03-26 LAB — CBC
HCT: 43.7 % (ref 36.0–46.0)
Hemoglobin: 13.7 g/dL (ref 12.0–15.0)
MCH: 28.5 pg (ref 26.0–34.0)
MCHC: 31.4 g/dL (ref 30.0–36.0)
MCV: 91 fL (ref 80.0–100.0)
Platelets: 223 K/uL (ref 150–400)
RBC: 4.8 MIL/uL (ref 3.87–5.11)
RDW: 13.9 % (ref 11.5–15.5)
WBC: 5.9 K/uL (ref 4.0–10.5)
nRBC: 0 % (ref 0.0–0.2)

## 2024-03-26 LAB — BASIC METABOLIC PANEL WITH GFR
Anion gap: 14 (ref 5–15)
BUN: 15 mg/dL (ref 8–23)
CO2: 16 mmol/L — ABNORMAL LOW (ref 22–32)
Calcium: 7.9 mg/dL — ABNORMAL LOW (ref 8.9–10.3)
Chloride: 107 mmol/L (ref 98–111)
Creatinine, Ser: 1.25 mg/dL — ABNORMAL HIGH (ref 0.44–1.00)
GFR, Estimated: 44 mL/min — ABNORMAL LOW (ref >60)
Glucose, Bld: 62 mg/dL — ABNORMAL LOW (ref 70–99)
Potassium: 3.7 mmol/L (ref 3.5–5.1)
Sodium: 137 mmol/L (ref 135–145)

## 2024-03-26 MED ORDER — PANTOPRAZOLE SODIUM 40 MG PO TBEC
40.0000 mg | DELAYED_RELEASE_TABLET | Freq: Every day | ORAL | Status: DC
  Administered 2024-03-26: 40 mg via ORAL
  Filled 2024-03-26: qty 1

## 2024-03-26 MED ORDER — LEVETIRACETAM 500 MG PO TABS
500.0000 mg | ORAL_TABLET | Freq: Two times a day (BID) | ORAL | Status: DC
  Administered 2024-03-27: 500 mg via ORAL
  Filled 2024-03-26: qty 1

## 2024-03-26 MED ORDER — LEVETIRACETAM 500 MG PO TABS
500.0000 mg | ORAL_TABLET | Freq: Two times a day (BID) | ORAL | Status: DC
  Administered 2024-03-26: 500 mg
  Filled 2024-03-26: qty 1

## 2024-03-26 NOTE — Progress Notes (Addendum)
 STROKE TEAM PROGRESS NOTE    SIGNIFICANT HOSPITAL EVENTS 11/7: Presented s/p fall with seizure with left gaze deviation and left-sided weakness. Had another seizure en route, given 5mg  Versed by EMS.  CTH showed small right IPH with adjacent cytotoxic edema MRI suspicious for infiltrative high-grade tumor as opposed to subacute ischemia.    INTERIM HISTORY/SUBJECTIVE Seen in room, no family at the bedside.  Follow-up load up with neurosurgery, no immediate intervention.  Case is to be presented at tumor board and follow-up MRI in about 6 weeks.  Now on a dysphagia 3 diet, meds can be p.o. and transfer out of ICU.  Neuroexam stable  OBJECTIVE  CBC    Component Value Date/Time   WBC 5.9 03/26/2024 0632   RBC 4.80 03/26/2024 0632   HGB 13.7 03/26/2024 0632   HCT 43.7 03/26/2024 0632   PLT 223 03/26/2024 0632   MCV 91.0 03/26/2024 0632   MCH 28.5 03/26/2024 0632   MCHC 31.4 03/26/2024 0632   RDW 13.9 03/26/2024 0632   LYMPHSABS 2.0 03/23/2024 1110   MONOABS 0.7 03/23/2024 1110   EOSABS 0.1 03/23/2024 1110   BASOSABS 0.1 03/23/2024 1110    BMET    Component Value Date/Time   NA 134 (L) 03/25/2024 0424   K 3.7 03/25/2024 0424   CL 103 03/25/2024 0424   CO2 21 (L) 03/25/2024 0424   GLUCOSE 83 03/25/2024 0424   BUN 14 03/25/2024 0424   CREATININE 1.44 (H) 03/25/2024 0424   CALCIUM  8.1 (L) 03/25/2024 0424   GFRNONAA 37 (L) 03/25/2024 0424    IMAGING past 24 hours No results found.   Vitals:   03/26/24 0800 03/26/24 0830 03/26/24 0900 03/26/24 0925  BP: 130/72 (!) 144/80 (!) 143/82 (!) 143/82  Pulse: 72 73 75   Resp: (!) 21 19 (!) 24   Temp:      TempSrc:      SpO2: 95% 95% 95%   Weight:       PHYSICAL EXAM General:  Alert, well-nourished, well-developed patient in no acute distress Psych:  Mood and affect appropriate for situation CV: Regular rate and rhythm on monitor Respiratory:  Regular, unlabored respirations on room air GI: Abdomen soft and  non-tender  NEURO:  Mental Status: AA. Disoriented to place. Able to select correct year when given choices. Able to identify her son at bedside. Knows her birthday and age. Speech/Language: No aphasia.  Naming, repetition, fluency, and comprehension intact.  Cranial Nerves:  II: PERRL. Visual fields full.  III, IV, VI: EOMI. Tracks bilaterally. Eyelids elevate symmetrically.  V: Sensation is intact to light touch and symmetrical to face.  VII: Left mild facial droop VIII: hearing intact to voice. IX, X: Palate elevates symmetrically. Mild dysarthria.  KP:Dynloizm shrug 5/5. XII: tongue is midline  Motor:  RUE: 5/5 no drift LUE: 4+/5, drift to bed beyond 10 seconds LLE: distal 3/5, prox 2+/5, pain with movement RLE distal 4/5, prox 3/5  Tone: is normal and bulk is normal Sensation- Decreased to left. Sensory neglect on DSS.  Coordination: FTN intact bilaterally, HKS: no ataxia in BLE.No drift.  Gait- deferred  Most Recent NIH: 6.   ASSESSMENT/PLAN  [name] is a 78 y.o. female with hx of HTN, OA and recent cognitive decline who presented s/p witnessed GTC seizure at home followed by left-sided facial droop and left-sided hemiplegia.  She had another seizure en route and was given 5 mg of Versed.  She was noted to have rhythmic left  neck and jaw twitching and was administered 2 mg of Ativan in the ED and loaded with Keppra.  She has no known seizure risk factors.   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 CTA head & neck No large vessel occlusion, hemodynamically significant stenosis, or aneurysm in the head or neck. MRI  Demonstrating patchy gyriform enhancement within the abnormal right frontal operculum, but also confluent suspicious enhancement  in the right thalamus. Questionable also abnormal enhancement in the right superior frontal gyrus, although axial images suggest this might be a developmental venous anomaly instead (benign). Despite absence of regional mass effect, the constellation raises the possibility of infiltrative primary tumor. NSGY recommends follow-up MRI in 6 weeks Follow up with Dr. Buckley outpatient  2D Echo EF 60 to 65%, mildly enlarged right ventricle, mild MVR LDL 67 HgbA1c 4.9 VTE prophylaxis - heparin subcu aspirin  81 mg daily prior to admission, now on No antithrombotic due to IPH and likely brain tumor Therapy recommendations:  SNF Disposition: Pending  Seizure Seizure with EMS and in ED 11/7 Stat EEG shows right hemisphere cortical dysfunction, likely postictal state with no seizures seen during recording 11/7 loaded with 3 g Keppra Continue on 500 mg Keppra Q12H maintenance dosing  Hypertension Home meds: Hyzaar 50-12.5 daily Labetalol and Hydralazine IVP PRNs Off Cleviprex gtt now On amlodipine 10 BP goal: SBP < 160 Long-term BP goal normotensive  Hyperlipidemia Home meds: Crestor  20 mg daily LDL 67, goal < 70 Resume statin on discharge  Dysphagia Patient has post-stroke dysphagia, SLP consulted On dysphagia 3 diet and thin liquid Continue gentle hydration  Other Stroke Risk Factors Advanced Age Obesity, Body mass index is 35.34 kg/m., BMI >/= 30 associated with increased stroke risk, recommend weight loss, diet and exercise as appropriate   Other Active Problems AKI, Cr1.56-1.4-1.35-1.44-1.25, on gentle IV fluid Cognitive impairment, on Aricept  Hospital day # 3   Patient seen and examined by NP/APP with MD. MD to update note as needed.   Vanessa Last, DNP, FNP-BC [facility] Pager: [phone]  ATTENDING NOTE: I reviewed above note and agree with the assessment and plan. Pt was seen and examined.   No family at bedside.  Patient lying bed, neuro stable,  still not fully orientated, cannot tell me the year, but otherwise orientated to month, place and age.  Still has mild left upper extremity drift.  Bilateral lower extremity weakness but left more than right.  BP stable on amlodipine 10.  Passed swallow on dysphagia 3 and thin liquid.  Continue Keppra.  Neurosurgery on board, recommend repeat MRI in 6 weeks.  And also recommend outpatient follow-up with neuro-oncology.  For detailed assessment and plan, please refer to above as I have made changes wherever appropriate.   Ary Cummins, MD PhD Stroke Neurology 03/26/2024 6:28 PM  This patient is critically ill due to brain tumor versus stroke with hemorrhagic transformation, seizure and at significant risk of neurological worsening, death form recurrent stroke, cerebral edema, status epilepticus. This patient's care requires constant monitoring of vital signs, hemodynamics, respiratory and cardiac monitoring, review of multiple databases, neurological assessment, discussion with family, other specialists and medical decision making of high complexity. I spent 30 minutes of neurocritical care time in the care of this patient.   To contact Stroke Continuity provider, please refer to  [email or web address]. After hours, contact General Neurology

## 2024-03-26 NOTE — Progress Notes (Signed)
 I was called by neurology service for this patient presenting with likely SZ although ddx included stroke. She underwent MRI brain w/w/o and CTA. MRI was personally reviewed and appears to demonstrate patchy enhancement of the right thalamus as well as significant microvascular disease and FLAIR signal change involving the posterior right frontal operculum. Because the clinical likelihood of stroke seems quite low, the MRI more likely reflects underlying infiltrative tumor. Unfortunately, given this patient's age and location of this tumor, surgical resection is obviously not an option. Furthermore, biopsy of the thalamus while feasible does carry non-trivial risk. I would therefore recommend short-term radiographic f/u with MRI w/w/o in 6-8 weeks. I will also forward this patient's case to our neuro-oncologist, Dr. Vazlow for likely outpatient f/u.  Gerldine Maizes, MD [facility]

## 2024-03-26 NOTE — Progress Notes (Signed)
 Modified Barium Swallow Study  Patient Details  Name: [name] MRN: [id number] Date of Birth: Jun 20, 1945  Today's Date: 03/26/2024  Modified Barium Swallow completed.  Full report located under Chart Review in the Imaging Section.  History of Present Illness 78 yo female with history of HTN, osteoarthritis, and memory loss who presents after sudden seizure activity with sudden onset L sided weakness. CTH shows R opercular acute parenchymal hemorrhage with adjacent cytotoxic edema. MRI suspicious for infiltrative high-grade tumor as opposed to subacute ischemia.   Clinical Impression Pt exhibits moderate pharyngeal dysphagia characterized by mistiming resulting in trace penetration that reaches the vocal folds with thin and nectar thick liquids (PAS 5). A volitional cough clears penetrates completely. Boluses pool in the open laryngeal vestibule and pyriform sinuses during the swallow and while complete laryngeal closure expels the majority of penetrates, trace liquids remain on the vocal folds. A chin tuck did not benefit laryngeal closure before the swallow. No penetration/aspiration occurred with honey thick liquids, purees, or solids but did increase the volume of pharyngeal residue. There is overall mild oral residue and pt has previously shown the ability to clear with a lingual sweep given Min cueing. Carryover of recommendations is limited so pt will require full supervision with initiation of Dys 3 diet and thin liquids. Cue pt to cough intermittently after drinking and use a lingual sweep as needed. Will continue following.  Factors that may increase risk of adverse event in presence of aspiration Noe & Lianne 2021): Reduced cognitive function;Limited mobility  Swallow Evaluation Recommendations Recommendations: PO diet PO Diet Recommendation: Dysphagia 3 (Mechanical soft);Thin liquids (Level 0) Liquid Administration via: Cup;Straw Medication Administration: Whole meds with  puree Supervision: Full assist for feeding;Full supervision/cueing for swallowing strategies Swallowing strategies  : Slow rate;Small bites/sips;Hard cough after swallowing Postural changes: Position pt fully upright for meals;Stay upright 30-60 min after meals Oral care recommendations: Oral care BID (2x/day);Oral care before PO;Staff/trained caregiver to provide oral care    Damien Blumenthal, M.A., CCC-SLP Speech Language Pathology, Acute Rehabilitation Services  Secure Chat preferred [phone]  03/26/2024,1:48 PM

## 2024-03-26 NOTE — Progress Notes (Signed)
 Returned safely from barium study.

## 2024-03-26 NOTE — TOC Progression Note (Addendum)
 Transition of Care [facility]) - Progression Note    Patient Details  Name: [name] MRN: [id number] Date of Birth: 27-Jan-1946  Transition [facility] Care [facility]) CM/SW Contact  Valentina Alcoser E Jatoya Armbrister, LCSW Phone Number: 03/26/2024, 9:10 AM  Clinical Narrative:    CSW noted patient's top choice for SNF is [facility]. CSW called Logan in Admissions at [facility], she agreed to review referral now.  9:30- Logan with [facility] is able to offer a bed for patient when medically ready. No auth needed as patient has traditional Medicare and has had a qualifying stay. Updated patient's son [name] by phone.  Expected Discharge Plan: Skilled Nursing Facility Barriers to Discharge: Continued Medical Work up               Expected Discharge Plan and Services In-house Referral: Clinical Social Work     Living arrangements for the past 2 months: Apartment                                       Social Drivers of Health (SDOH) Interventions SDOH Screenings   Food Insecurity: No Food Insecurity (03/23/2024)  Housing: Low Risk  (03/23/2024)  Transportation Needs: No Transportation Needs (03/23/2024)  Utilities: Not At Risk (03/23/2024)  Social Connections: Moderately Integrated (03/23/2024)  Tobacco Use: Low Risk  (08/11/2023)    Readmission Risk Interventions     No data to display

## 2024-03-26 NOTE — TOC CAGE-AID Note (Signed)
 Transition of Care [facility]) - CAGE-AID Screening   Patient Details  Name: [name] MRN: [id number] Date of Birth: 1945/11/08  Transition of Care [facility]) CM/SW Contact:    Lennon Boutwell E Filiberto Wamble, LCSW Phone Number: 03/26/2024, 9:55 AM   Clinical Narrative: No SA noted.   CAGE-AID Screening:    Have You Ever Felt You Ought to Cut Down on Your Drinking or Drug Use?: No Have People Annoyed You By Critizing Your Drinking Or Drug Use?: No Have You Felt Bad Or Guilty About Your Drinking Or Drug Use?: No Have You Ever Had a Drink or Used Drugs First Thing In The Morning to Steady Your Nerves or to Get Rid of a Hangover?: No CAGE-AID Score: 0  Substance Abuse Education Offered: No

## 2024-03-26 NOTE — Progress Notes (Signed)
 Speech Language Pathology Treatment: Dysphagia  Patient Details Name: [name] MRN: [id number] DOB: 25-Jul-1945 Today's Date: 03/26/2024 Time: 8584-8495 SLP Time Calculation (min) (ACUTE ONLY): 49 min  Assessment / Plan / Recommendation Clinical Impression  Continue current diet with full supervision to cue pt to cough after liquids (including soup, ice cream, jello) and to use a lingual sweep to clear her L lateral sulcus. SLP will continue following to target goals related to both dysphagia and cognition.  Swallowing: Pt fed herself from her lunch tray given set up assistance. Overall, she required Min multimodal cueing to use a lingual sweep and hard cough after swallowing. Sign posted within her visual field to help reinforce recommendations, though carryover appeared functional throughout the span of our session. Full supervision will continue to be beneficial to reinforce these strategies and ensure consistency of use.   Cognitive-Linguistic: Intelligibility at the conversation level is improving, though is also suspected to be impacted by her hearing. She is repeating unintelligible speech given Min prompting and using intelligibility strategies. Though she has memory deficits at baseline, written visuals appear to aid carryover of swallow strategies. Question her ability to carry over these strategies without supervision between meals or from day to day.    HPI HPI: 78 yo female with history of HTN, osteoarthritis, and memory loss who presents after sudden seizure activity with sudden onset L sided weakness. CTH shows R opercular acute parenchymal hemorrhage with adjacent cytotoxic edema. MRI suspicious for infiltrative high-grade tumor as opposed to subacute ischemia.      SLP Plan  Continue with current plan of care          Recommendations  Diet recommendations: Dysphagia 3 (mechanical soft);Thin liquid Liquids provided via: Cup;Straw Medication Administration: Whole meds  with puree Supervision: Full supervision/cueing for compensatory strategies;Staff to assist with self feeding Compensations: Minimize environmental distractions;Slow rate;Small sips/bites;Lingual sweep for clearance of pocketing;Hard cough after swallow Postural Changes and/or Swallow Maneuvers: Seated upright 90 degrees                  Oral care BID;Oral care before and after PO   Frequent or constant Supervision/Assistance Dysphagia, oropharyngeal phase (R13.12);Dysarthria and anarthria (R47.1);Cognitive communication deficit (M58.158)     Continue with current plan of care     Damien Blumenthal, M.A., CCC-SLP Speech Language Pathology, Acute Rehabilitation Services  Secure Chat preferred [phone]   03/26/2024, 3:12 PM

## 2024-03-26 NOTE — Progress Notes (Signed)
   03/26/24 1459  Hand-off documentation  Hand-off Given Given to shift RN/LPN  Report given to (Full Name) JOE RN-  charge  Hand-off Received Received from shift RN/LPN  Report received from (Full Name) Alfonse RN

## 2024-03-27 ENCOUNTER — Telehealth: Payer: Self-pay | Admitting: Internal Medicine

## 2024-03-27 ENCOUNTER — Other Ambulatory Visit: Payer: Self-pay | Admitting: *Deleted

## 2024-03-27 DIAGNOSIS — D496 Neoplasm of unspecified behavior of brain: Secondary | ICD-10-CM

## 2024-03-27 DIAGNOSIS — G9389 Other specified disorders of brain: Secondary | ICD-10-CM

## 2024-03-27 LAB — CBC
HCT: 42.7 % (ref 36.0–46.0)
Hemoglobin: 14 g/dL (ref 12.0–15.0)
MCH: 28.6 pg (ref 26.0–34.0)
MCHC: 32.8 g/dL (ref 30.0–36.0)
MCV: 87.1 fL (ref 80.0–100.0)
Platelets: 255 K/uL (ref 150–400)
RBC: 4.9 MIL/uL (ref 3.87–5.11)
RDW: 13.9 % (ref 11.5–15.5)
WBC: 7.2 K/uL (ref 4.0–10.5)
nRBC: 0 % (ref 0.0–0.2)

## 2024-03-27 LAB — BASIC METABOLIC PANEL WITH GFR
Anion gap: 11 (ref 5–15)
BUN: 15 mg/dL (ref 8–23)
CO2: 19 mmol/L — ABNORMAL LOW (ref 22–32)
Calcium: 8.2 mg/dL — ABNORMAL LOW (ref 8.9–10.3)
Chloride: 103 mmol/L (ref 98–111)
Creatinine, Ser: 1.11 mg/dL — ABNORMAL HIGH (ref 0.44–1.00)
GFR, Estimated: 50 mL/min — ABNORMAL LOW (ref >60)
Glucose, Bld: 97 mg/dL (ref 70–99)
Potassium: 3.2 mmol/L — ABNORMAL LOW (ref 3.5–5.1)
Sodium: 133 mmol/L — ABNORMAL LOW (ref 135–145)

## 2024-03-27 MED ORDER — AMLODIPINE BESYLATE 10 MG PO TABS: 10.0000 mg | ORAL_TABLET | Freq: Every day | ORAL | Status: DC

## 2024-03-27 MED ORDER — LEVETIRACETAM 500 MG PO TABS: 500.0000 mg | ORAL_TABLET | Freq: Two times a day (BID) | ORAL | Status: DC

## 2024-03-27 NOTE — TOC Transition Note (Signed)
 Transition of Care [facility]) - Discharge Note   Patient Details  Name: [name] MRN: [id number] Date of Birth: 02-Oct-1945  Transition of Care [facility]) CM/SW Contact:  Vanessa CHRISTELLA Goodie, LCSW Phone Number: 03/27/2024, 12:55 PM   Clinical Narrative:   CSW notified by MD that patient stable for discharge. CSW confirmed with [facility] that they have a bed available for the patient today. CSW sent discharge information, confirmed receipt, and that room will be available at [facility] at 2. CSW spoke with son, [name], he is in agreement and appreciative of update. Transport arranged with [facility] for next available.  Nurse to call report to [phone], Room 308B.    Final next level of care: Skilled Nursing Facility Barriers to Discharge: Barriers Resolved   Patient Goals and CMS Choice     Choice offered to / list presented to : Adult Children (Patients son [name])      Discharge Placement              Patient [facility] bed at: [facility] Patient to be transferred to facility by: [facility] Name of family member notified: [name] Patient and family notified of of transfer: 03/27/24  Discharge Plan and Services Additional resources added to the After Visit Summary for   In-house Referral: Clinical Social Work                                   Social Drivers of Health (SDOH) Interventions SDOH Screenings   Food Insecurity: No Food Insecurity (03/23/2024)  Housing: Low Risk  (03/23/2024)  Transportation Needs: No Transportation Needs (03/23/2024)  Utilities: Not At Risk (03/23/2024)  Social Connections: Moderately Integrated (03/23/2024)  Tobacco Use: Low Risk  (08/11/2023)     Readmission Risk Interventions     No data to display

## 2024-03-27 NOTE — Care Management Important Message (Signed)
 Important Message  Patient Details  Name: NIKITIA ASBILL MRN: [id number] Date of Birth: 1945-06-19   Important Message Given:  Yes - Medicare IM     [name] 03/27/2024, 2:29 PM

## 2024-03-27 NOTE — Progress Notes (Addendum)
 [facility] called three times to give report on patient. No answer. Unable to leave voicemail d/t not being set up.  [email or web address] : Report give to Tedi, CHAR[facility]DRA[facility]ER at [place].

## 2024-03-27 NOTE — Care Management Important Message (Signed)
 Important Message  Patient Details  Name: [name] MRN: [id number] Date of Birth: [birth year]   Important Message Given:  Yes - Medicare IM     [name] 03/27/2024, 2:31 PM

## 2024-03-27 NOTE — Discharge Summary (Addendum)
 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   Secondary diagnosis Seizure Hypertension Hyperlipidemia Dysphagia Obesity AKI Cognitive impairment on Aricept  Patient Active Problem List   Diagnosis Date Noted   Stroke, 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   Pneumonia due to COVID-19 virus 02/26/2019   Respiratory failure (HCC)/ hypoxic 02/26/2019   Dyslipidemia 02/26/2019   Bunion 08/21/2014   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   Onychomycosis 02/28/2013   Pain, foot 02/28/2013   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   Essential hypertension 05/31/2012   GERD (gastroesophageal reflux disease) 05/31/2012   Osteoarthritis of knee, unspecified 04/25/2012     Allergies as of 03/27/2024       Reactions   Shellfish Protein-containing Drug Products Other (See Comments)   Shrimp and lobster--severe swelling/itching   Carisoprodol-aspirin -codeine Other (See Comments)   Unknown    Codeine Nausea And Vomiting   Doxycycline Nausea Only   Tetracycline Nausea Only        Medication List     STOP taking these medications    aspirin  EC 81 MG tablet   celecoxib 100 MG capsule Commonly known as: CELEBREX   cyclobenzaprine  5 MG tablet Commonly known as: FLEXERIL    Diethylpropion HCl CR 75 MG Tb24   oxyCODONE -acetaminophen  5-325 MG tablet Commonly known as: PERCOCET/ROXICET       TAKE these medications    albuterol  108 (90 Base) MCG/ACT inhaler Commonly known as: VENTOLIN  HFA Inhale 1-2 puffs into the lungs every 6  (six) hours as needed for wheezing or shortness of breath.   amLODipine 10 MG tablet Commonly known as: NORVASC Take 1 tablet (10 mg total) by mouth daily. Start taking on: March 28, 2024   diclofenac Sodium 1 % Gel Commonly known as: VOLTAREN Apply 1 g topically 2 (two) times daily.   donepezil 10 MG tablet Commonly known as: ARICEPT Take 10 mg by mouth daily.   levETIRAcetam 500 MG tablet Commonly known as: KEPPRA Take 1 tablet (500 mg total) by mouth 2 (two) times daily.   lidocaine  4 % Commonly known as: HM Lidocaine  Patch Place 1 patch onto the skin daily.   losartan-hydrochlorothiazide 50-12.5 MG tablet Commonly known as: HYZAAR Take 1 tablet by mouth daily.   omeprazole 40 MG capsule Commonly known as: PRILOSEC Take 40 mg by mouth daily.   prednisoLONE acetate 1 % ophthalmic suspension Commonly known as: PRED FORTE Place 1 drop into the right eye 4 (four) times daily.   rosuvastatin  20 MG tablet Commonly known as: CRESTOR  Take 20 mg by mouth daily.        LABORATORY STUDIES CBC    Component Value Date/Time   WBC 7.2 03/27/2024 0126   RBC 4.90 03/27/2024 0126   HGB 14.0 03/27/2024 0126   HCT 42.7 03/27/2024 0126   PLT 255 03/27/2024 0126   MCV 87.1 03/27/2024 0126   MCH 28.6 03/27/2024 0126   MCHC 32.8 03/27/2024 0126   RDW 13.9 03/27/2024 0126   LYMPHSABS 2.0 03/23/2024 1110   MONOABS 0.7 03/23/2024 1110   EOSABS 0.1 03/23/2024 1110  BASOSABS 0.1 03/23/2024 1110   CMP    Component Value Date/Time   NA 133 (L) 03/27/2024 0126   K 3.2 (L) 03/27/2024 0126   CL 103 03/27/2024 0126   CO2 19 (L) 03/27/2024 0126   GLUCOSE 97 03/27/2024 0126   BUN 15 03/27/2024 0126   CREATININE 1.11 (H) 03/27/2024 0126   CALCIUM  8.2 (L) 03/27/2024 0126   PROT 6.7 03/24/2024 0527   ALBUMIN 2.9 (L) 03/24/2024 0527   AST 35 03/24/2024 0527   ALT 15 03/24/2024 0527   ALKPHOS 55 03/24/2024 0527   BILITOT 1.8 (H) 03/24/2024 0527   GFRNONAA 50 (L) 03/27/2024  0126   GFRAA >60 03/05/2019 0215   COAGS Lab Results  Component Value Date   INR 1.1 03/23/2024   INR 1.0 02/26/2023   Lipid Panel    Component Value Date/Time   CHOL 128 03/24/2024 1350   TRIG 96 03/24/2024 1350   HDL 42 03/24/2024 1350   CHOLHDL 3.0 03/24/2024 1350   VLDL 19 03/24/2024 1350   LDLCALC 67 03/24/2024 1350   HgbA1C  Lab Results  Component Value Date   HGBA1C 4.9 03/24/2024   Alcohol Level    Component Value Date/Time   [facility] <15 03/23/2024 1110     SIGNIFICANT DIAGNOSTIC STUDIES  Recommendations/Plan: Swallowing Evaluation Recommendations Swallowing Evaluation Recommendations Recommendations: PO diet PO Diet Recommendation: Dysphagia 3 (Mechanical soft); Thin liquids (Level 0) Liquid Administration via: Cup; Straw Medication Administration: Whole meds with puree Supervision: Full assist for feeding; Full supervision/cueing for swallowing strategies Swallowing strategies  : Slow rate; Small bites/sips; Hard cough after swallowing Postural changes: Position pt fully upright for meals; Stay upright 30-60 min after meals Oral care recommendations: Oral care BID (2x/day); Oral care before PO; Staff/trained caregiver to provide oral care   MRI Brain W WO IMPRESSION: 1. Findings most suggestive of evolving subacute infarct in the right frontal lobe with petechial hemorrhage, slight restricted diffusion and subtle enhancement. A follow-up MRI with contrast in 2 to 3 months is recommended to ensure expected evolution and exclude other etiologies.    HISTORY OF PRESENT ILLNESS 78 y.o. patient with history of HTN, OA and recent cognitive decline who presented s/p witnessed GTC seizure at home followed by left-sided facial droop and left-sided hemiplegia. She had another seizure en route and was given 5 mg of Versed. She was noted to have rhythmic left neck and jaw twitching and was administered 2 mg of Ativan in the ED and loaded with Keppra. She has no known  seizure risk factors.   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 CTA head & neck No large vessel occlusion, hemodynamically significant stenosis, or aneurysm in the head or neck. MRI  Demonstrating patchy gyriform enhancement within the abnormal right frontal operculum, but also confluent suspicious enhancement in the right thalamus. Questionable also abnormal enhancement in the right superior frontal gyrus, although axial images suggest this might be a developmental venous anomaly instead (benign). Despite absence of regional mass effect, the constellation raises the possibility of infiltrative primary tumor. NSGY recommends follow-up MRI in 6 weeks Follow up with Dr. 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 Seizure with EMS and in ED 11/7 Stat EEG shows right hemisphere cortical dysfunction, likely postictal state with no seizures seen during recording 11/7 loaded with 3 g Keppra Continue on 500 mg Keppra Q12H on discharge   Hypertension Home meds: Hyzaar 50-12.5 daily Stable now On amlodipine 10 Long-term BP goal normotensive   Hyperlipidemia Home meds: Crestor  20 mg daily LDL 67, goal < 70 Continue statin on discharge   Dysphagia Patient has post-stroke dysphagia, SLP consulted On dysphagia 3 diet and thin liquid Advance diet as able   Other Stroke Risk Factors Advanced Age Obesity, Body mass index is 35.34 kg/m., BMI >/= 30 associated with increased stroke risk, recommend  weight loss, diet and exercise as appropriate    Other Active Problems AKI, Cr1.56-1.4-1.35-1.44-1.25 -1.11 Cognitive impairment, on Aricept   DISCHARGE EXAM  Mental Status: AA. Disoriented to place. Able to select correct year when given choices. Able to identify her son at bedside. Knows her birthday and age. Speech/Language: No aphasia.  Naming, repetition, fluency, and comprehension intact.   Cranial Nerves:  II: PERRL. Visual fields full.  III, IV, VI: EOMI. Tracks bilaterally. Eyelids elevate symmetrically.  V: Sensation is intact to light touch and symmetrical to face.  VII: Left mild facial droop VIII: hearing intact to voice. IX, X: Palate elevates symmetrically. Mild dysarthria.  KP:Dynloizm shrug 5/5. XII: tongue is midline  Motor:  RUE: 5/5 no drift LUE: 4+/5, drift to bed beyond 10 seconds LLE: distal 3/5, prox 2+/5, pain with movement RLE distal 4/5, prox 3/5   Tone: is normal and bulk is normal Sensation- Decreased to left. Sensory neglect on DSS.  Coordination: FTN intact bilaterally, HKS: no ataxia in BLE.No drift.  Gait- deferred   Discharge Diet       Diet   DIET DYS 3 Room service appropriate? Yes with Assist; Fluid consistency: Thin   liquids  DISCHARGE PLAN Disposition: Skilled nursing facility No antithrombotic for secondary stroke prevention Ongoing stroke risk factor control by Primary Care Physician at time of discharge Follow-up PCP Hillman Bare, MD in 2 weeks. Follow up with Dr. Buckley Follow-up in [facility] in 8 weeks, office to schedule an appointment. Able to see NP in clinic.  35 minutes were spent preparing discharge.  Patient seen and examined by NP/APP with MD. MD to update note as needed.   Jorene Last, DNP, FNP-BC [facility] Pager: [phone]  ATTENDING NOTE: I reviewed above note and agree with the assessment and plan. Pt was seen and examined.   No acute event  overnight.  Patient neuro stable, unchanged.  Continue Keppra and statin on discharge follow-up with neurosurgery and neuro-oncology as outpatient.  Repeat MRI in 6 weeks per neurosurgery.  Patient medically ready for SNF placement.  Follow-up with [facility].  For detailed assessment and plan, please refer to above as I have made changes wherever appropriate.   Ary Cummins, MD PhD Stroke Neurology 03/27/2024 6:56 PM

## 2024-03-27 NOTE — Telephone Encounter (Signed)
 Left the patient a voicemail to please call to schedule a new patient appointment with Dr.Vaslow.

## 2024-04-02 ENCOUNTER — Other Ambulatory Visit: Payer: Self-pay

## 2024-04-02 ENCOUNTER — Emergency Department (HOSPITAL_COMMUNITY)

## 2024-04-02 ENCOUNTER — Encounter: Payer: Self-pay | Admitting: Genetic Counselor

## 2024-04-02 ENCOUNTER — Inpatient Hospital Stay

## 2024-04-02 ENCOUNTER — Inpatient Hospital Stay (HOSPITAL_COMMUNITY)
Admission: EM | Admit: 2024-04-02 | Discharge: 2024-04-13 | DRG: 871 | Disposition: A | Discharge status: Skilled Nursing Facility | Source: Skilled Nursing Facility | Attending: Internal Medicine | Admitting: Internal Medicine

## 2024-04-02 DIAGNOSIS — G8194 Hemiplegia, unspecified affecting left nondominant side: Secondary | ICD-10-CM | POA: Diagnosis present

## 2024-04-02 DIAGNOSIS — R6520 Severe sepsis without septic shock: Secondary | ICD-10-CM | POA: Diagnosis present

## 2024-04-02 DIAGNOSIS — Z96653 Presence of artificial knee joint, bilateral: Secondary | ICD-10-CM | POA: Diagnosis present

## 2024-04-02 DIAGNOSIS — E785 Hyperlipidemia, unspecified: Secondary | ICD-10-CM | POA: Diagnosis present

## 2024-04-02 DIAGNOSIS — A411 Sepsis due to other specified staphylococcus: Principal | ICD-10-CM | POA: Diagnosis present

## 2024-04-02 DIAGNOSIS — R748 Abnormal levels of other serum enzymes: Secondary | ICD-10-CM | POA: Diagnosis present

## 2024-04-02 DIAGNOSIS — F05 Delirium due to known physiological condition: Secondary | ICD-10-CM | POA: Diagnosis present

## 2024-04-02 DIAGNOSIS — D751 Secondary polycythemia: Secondary | ICD-10-CM | POA: Diagnosis present

## 2024-04-02 DIAGNOSIS — E871 Hypo-osmolality and hyponatremia: Secondary | ICD-10-CM | POA: Diagnosis present

## 2024-04-02 DIAGNOSIS — R569 Unspecified convulsions: Secondary | ICD-10-CM | POA: Diagnosis not present

## 2024-04-02 DIAGNOSIS — Z90710 Acquired absence of both cervix and uterus: Secondary | ICD-10-CM

## 2024-04-02 DIAGNOSIS — E860 Dehydration: Secondary | ICD-10-CM | POA: Diagnosis present

## 2024-04-02 DIAGNOSIS — G9389 Other specified disorders of brain: Secondary | ICD-10-CM | POA: Diagnosis not present

## 2024-04-02 DIAGNOSIS — K219 Gastro-esophageal reflux disease without esophagitis: Secondary | ICD-10-CM | POA: Diagnosis present

## 2024-04-02 DIAGNOSIS — I10 Essential (primary) hypertension: Secondary | ICD-10-CM | POA: Diagnosis present

## 2024-04-02 DIAGNOSIS — G9341 Metabolic encephalopathy: Secondary | ICD-10-CM | POA: Diagnosis present

## 2024-04-02 DIAGNOSIS — E876 Hypokalemia: Secondary | ICD-10-CM | POA: Diagnosis present

## 2024-04-02 DIAGNOSIS — Z66 Do not resuscitate: Secondary | ICD-10-CM | POA: Diagnosis present

## 2024-04-02 DIAGNOSIS — F0390 Unspecified dementia without behavioral disturbance: Secondary | ICD-10-CM | POA: Diagnosis present

## 2024-04-02 DIAGNOSIS — Z823 Family history of stroke: Secondary | ICD-10-CM

## 2024-04-02 DIAGNOSIS — R4182 Altered mental status, unspecified: Secondary | ICD-10-CM | POA: Diagnosis present

## 2024-04-02 DIAGNOSIS — C719 Malignant neoplasm of brain, unspecified: Secondary | ICD-10-CM | POA: Diagnosis present

## 2024-04-02 DIAGNOSIS — Z91013 Allergy to seafood: Secondary | ICD-10-CM | POA: Diagnosis not present

## 2024-04-02 DIAGNOSIS — Z881 Allergy status to other antibiotic agents status: Secondary | ICD-10-CM

## 2024-04-02 DIAGNOSIS — J189 Pneumonia, unspecified organism: Principal | ICD-10-CM

## 2024-04-02 DIAGNOSIS — Z79899 Other long term (current) drug therapy: Secondary | ICD-10-CM

## 2024-04-02 DIAGNOSIS — Z1152 Encounter for screening for COVID-19: Secondary | ICD-10-CM | POA: Diagnosis not present

## 2024-04-02 DIAGNOSIS — E66812 Obesity, class 2: Secondary | ICD-10-CM | POA: Diagnosis present

## 2024-04-02 DIAGNOSIS — Z8249 Family history of ischemic heart disease and other diseases of the circulatory system: Secondary | ICD-10-CM | POA: Diagnosis not present

## 2024-04-02 DIAGNOSIS — N179 Acute kidney failure, unspecified: Secondary | ICD-10-CM | POA: Diagnosis present

## 2024-04-02 DIAGNOSIS — Z7189 Other specified counseling: Secondary | ICD-10-CM | POA: Diagnosis not present

## 2024-04-02 DIAGNOSIS — Z8673 Personal history of transient ischemic attack (TIA), and cerebral infarction without residual deficits: Secondary | ICD-10-CM

## 2024-04-02 DIAGNOSIS — J690 Pneumonitis due to inhalation of food and vomit: Secondary | ICD-10-CM | POA: Diagnosis present

## 2024-04-02 DIAGNOSIS — E861 Hypovolemia: Secondary | ICD-10-CM | POA: Diagnosis present

## 2024-04-02 DIAGNOSIS — I69391 Dysphagia following cerebral infarction: Secondary | ICD-10-CM | POA: Diagnosis not present

## 2024-04-02 DIAGNOSIS — Z885 Allergy status to narcotic agent status: Secondary | ICD-10-CM

## 2024-04-02 DIAGNOSIS — Z6835 Body mass index (BMI) 35.0-35.9, adult: Secondary | ICD-10-CM

## 2024-04-02 DIAGNOSIS — R1312 Dysphagia, oropharyngeal phase: Secondary | ICD-10-CM | POA: Diagnosis present

## 2024-04-02 DIAGNOSIS — Z515 Encounter for palliative care: Secondary | ICD-10-CM | POA: Diagnosis not present

## 2024-04-02 DIAGNOSIS — G4089 Other seizures: Secondary | ICD-10-CM | POA: Diagnosis present

## 2024-04-02 DIAGNOSIS — G939 Disorder of brain, unspecified: Secondary | ICD-10-CM | POA: Diagnosis not present

## 2024-04-02 DIAGNOSIS — Z9189 Other specified personal risk factors, not elsewhere classified: Secondary | ICD-10-CM | POA: Diagnosis not present

## 2024-04-02 LAB — I-STAT CHEM 8, ED
BUN: 29 mg/dL — ABNORMAL HIGH (ref 8–23)
Calcium, Ion: 1.01 mmol/L — ABNORMAL LOW (ref 1.15–1.40)
Chloride: 100 mmol/L (ref 98–111)
Creatinine, Ser: 1.6 mg/dL — ABNORMAL HIGH (ref 0.44–1.00)
Glucose, Bld: 109 mg/dL — ABNORMAL HIGH (ref 70–99)
HCT: 52 % — ABNORMAL HIGH (ref 36.0–46.0)
Hemoglobin: 17.7 g/dL — ABNORMAL HIGH (ref 12.0–15.0)
Potassium: 5.7 mmol/L — ABNORMAL HIGH (ref 3.5–5.1)
Sodium: 133 mmol/L — ABNORMAL LOW (ref 135–145)
TCO2: 27 mmol/L (ref 22–32)

## 2024-04-02 LAB — PROTIME-INR
INR: 1.1 (ref 0.8–1.2)
Prothrombin Time: 14.7 s (ref 11.4–15.2)

## 2024-04-02 LAB — CBC
HCT: 50.7 % — ABNORMAL HIGH (ref 36.0–46.0)
Hemoglobin: 16.7 g/dL — ABNORMAL HIGH (ref 12.0–15.0)
MCH: 28.5 pg (ref 26.0–34.0)
MCHC: 32.9 g/dL (ref 30.0–36.0)
MCV: 86.7 fL (ref 80.0–100.0)
Platelets: 263 K/uL (ref 150–400)
RBC: 5.85 MIL/uL — ABNORMAL HIGH (ref 3.87–5.11)
RDW: 14.8 % (ref 11.5–15.5)
WBC: 9.9 K/uL (ref 4.0–10.5)
nRBC: 0 % (ref 0.0–0.2)

## 2024-04-02 LAB — BLOOD GAS, VENOUS
Acid-base deficit: 1.3 mmol/L (ref 0.0–2.0)
Bicarbonate: 22.7 mmol/L (ref 20.0–28.0)
O2 Saturation: 94.4 %
Patient temperature: 37
pCO2, Ven: 35 mmHg — ABNORMAL LOW (ref 44–60)
pH, Ven: 7.42 (ref 7.25–7.43)
pO2, Ven: 68 mmHg — ABNORMAL HIGH (ref 32–45)

## 2024-04-02 LAB — URINALYSIS, ROUTINE W REFLEX MICROSCOPIC
Bacteria, UA: NONE SEEN
Bilirubin Urine: NEGATIVE
Glucose, UA: NEGATIVE mg/dL
Ketones, ur: 20 mg/dL — AB
Leukocytes,Ua: NEGATIVE
Nitrite: NEGATIVE
Protein, ur: 100 mg/dL — AB
Specific Gravity, Urine: 1.018 (ref 1.005–1.030)
pH: 5 (ref 5.0–8.0)

## 2024-04-02 LAB — COMPREHENSIVE METABOLIC PANEL WITH GFR
ALT: 13 U/L (ref 0–44)
AST: 50 U/L — ABNORMAL HIGH (ref 15–41)
Albumin: 3.2 g/dL — ABNORMAL LOW (ref 3.5–5.0)
Alkaline Phosphatase: 67 U/L (ref 38–126)
Anion gap: 11 (ref 5–15)
BUN: 20 mg/dL (ref 8–23)
CO2: 23 mmol/L (ref 22–32)
Calcium: 8.9 mg/dL (ref 8.9–10.3)
Chloride: 98 mmol/L (ref 98–111)
Creatinine, Ser: 1.57 mg/dL — ABNORMAL HIGH (ref 0.44–1.00)
GFR, Estimated: 33 mL/min — ABNORMAL LOW (ref >60)
Glucose, Bld: 112 mg/dL — ABNORMAL HIGH (ref 70–99)
Potassium: 5.1 mmol/L (ref 3.5–5.1)
Sodium: 132 mmol/L — ABNORMAL LOW (ref 135–145)
Total Bilirubin: 1.8 mg/dL — ABNORMAL HIGH (ref 0.0–1.2)
Total Protein: 7.7 g/dL (ref 6.5–8.1)

## 2024-04-02 LAB — RESP PANEL BY RT-PCR (RSV, FLU A&B, COVID)  RVPGX2
Influenza A by PCR: NEGATIVE
Influenza B by PCR: NEGATIVE
Resp Syncytial Virus by PCR: NEGATIVE
SARS Coronavirus 2 by RT PCR: NEGATIVE

## 2024-04-02 LAB — DIFFERENTIAL
Abs Immature Granulocytes: 0.08 K/uL — ABNORMAL HIGH (ref 0.00–0.07)
Basophils Absolute: 0.1 K/uL (ref 0.0–0.1)
Basophils Relative: 1 %
Eosinophils Absolute: 0 K/uL (ref 0.0–0.5)
Eosinophils Relative: 0 %
Immature Granulocytes: 1 %
Lymphocytes Relative: 11 %
Lymphs Abs: 1.1 K/uL (ref 0.7–4.0)
Monocytes Absolute: 0.7 K/uL (ref 0.1–1.0)
Monocytes Relative: 7 %
Neutro Abs: 8 K/uL — ABNORMAL HIGH (ref 1.7–7.7)
Neutrophils Relative %: 80 %

## 2024-04-02 LAB — LACTIC ACID, PLASMA: Lactic Acid, Venous: 1.2 mmol/L (ref 0.5–1.9)

## 2024-04-02 LAB — CBG MONITORING, ED: Glucose-Capillary: 97 mg/dL (ref 70–99)

## 2024-04-02 LAB — APTT: aPTT: 26 s (ref 24–36)

## 2024-04-02 MED ORDER — PROCHLORPERAZINE EDISYLATE 10 MG/2ML IJ SOLN: 5.0000 mg | Freq: Four times a day (QID) | INTRAMUSCULAR | Status: DC | PRN

## 2024-04-02 MED ORDER — ACETAMINOPHEN 500 MG PO TABS
500.0000 mg | ORAL_TABLET | Freq: Four times a day (QID) | ORAL | Status: DC | PRN
  Administered 2024-04-03 – 2024-04-12 (×2): 500 mg via ORAL
  Filled 2024-04-02 (×2): qty 1

## 2024-04-02 MED ORDER — POLYETHYLENE GLYCOL 3350 17 G PO PACK: 17.0000 g | PACK | Freq: Every day | ORAL | Status: DC | PRN

## 2024-04-02 MED ORDER — ACETAMINOPHEN 10 MG/ML IV SOLN
1000.0000 mg | Freq: Once | INTRAVENOUS | Status: AC
  Administered 2024-04-02: 1000 mg via INTRAVENOUS
  Filled 2024-04-02: qty 100

## 2024-04-02 MED ORDER — VANCOMYCIN HCL 2000 MG/400ML IV SOLN
2000.0000 mg | Freq: Once | INTRAVENOUS | Status: AC
  Administered 2024-04-02: 2000 mg via INTRAVENOUS
  Filled 2024-04-02: qty 400

## 2024-04-02 MED ORDER — VANCOMYCIN HCL IN DEXTROSE 1-5 GM/200ML-% IV SOLN: 1000.0000 mg | Freq: Once | INTRAVENOUS | Status: DC

## 2024-04-02 MED ORDER — SODIUM CHLORIDE 0.9 % IV SOLN
2.0000 g | Freq: Two times a day (BID) | INTRAVENOUS | Status: DC
  Administered 2024-04-03 – 2024-04-04 (×3): 2 g via INTRAVENOUS
  Filled 2024-04-02 (×3): qty 12.5

## 2024-04-02 MED ORDER — AMLODIPINE BESYLATE 10 MG PO TABS
10.0000 mg | ORAL_TABLET | Freq: Every day | ORAL | Status: DC
  Administered 2024-04-04 – 2024-04-09 (×6): 10 mg via ORAL
  Filled 2024-04-02 (×6): qty 1

## 2024-04-02 MED ORDER — ENOXAPARIN SODIUM 40 MG/0.4ML IJ SOSY
40.0000 mg | PREFILLED_SYRINGE | INTRAMUSCULAR | Status: DC
  Administered 2024-04-02 – 2024-04-12 (×11): 40 mg via SUBCUTANEOUS
  Filled 2024-04-02 (×11): qty 0.4

## 2024-04-02 MED ORDER — LEVETIRACETAM 250 MG PO TABS
500.0000 mg | ORAL_TABLET | Freq: Two times a day (BID) | ORAL | Status: DC
  Administered 2024-04-03 – 2024-04-09 (×12): 500 mg via ORAL
  Filled 2024-04-02 (×13): qty 2

## 2024-04-02 MED ORDER — SODIUM CHLORIDE 0.9 % IV BOLUS
1000.0000 mL | Freq: Once | INTRAVENOUS | Status: AC
  Administered 2024-04-02: 1000 mL via INTRAVENOUS

## 2024-04-02 MED ORDER — SODIUM ZIRCONIUM CYCLOSILICATE 10 G PO PACK
10.0000 g | PACK | Freq: Two times a day (BID) | ORAL | Status: DC
Start: 2024-04-02 — End: 2024-04-03

## 2024-04-02 MED ORDER — MELATONIN 5 MG PO TABS: 5.0000 mg | ORAL_TABLET | Freq: Every evening | ORAL | Status: DC | PRN

## 2024-04-02 MED ORDER — LORAZEPAM 2 MG/ML IJ SOLN
2.0000 mg | Freq: Four times a day (QID) | INTRAMUSCULAR | Status: DC | PRN
Start: 2024-04-02 — End: 2024-04-13

## 2024-04-02 MED ORDER — SODIUM CHLORIDE 0.9 % IV SOLN: INTRAVENOUS | Status: AC

## 2024-04-02 MED ORDER — ROSUVASTATIN CALCIUM 20 MG PO TABS
20.0000 mg | ORAL_TABLET | Freq: Every day | ORAL | Status: DC
  Administered 2024-04-04 – 2024-04-09 (×6): 20 mg via ORAL
  Filled 2024-04-02 (×6): qty 1

## 2024-04-02 MED ORDER — LEVETIRACETAM (KEPPRA) 500 MG/5 ML ADULT IV PUSH
1000.0000 mg | Freq: Once | INTRAVENOUS | Status: AC
  Administered 2024-04-02: 1000 mg via INTRAVENOUS
  Filled 2024-04-02: qty 10

## 2024-04-02 MED ORDER — SODIUM CHLORIDE 0.9 % IV SOLN
2.0000 g | Freq: Once | INTRAVENOUS | Status: AC
  Administered 2024-04-02: 2 g via INTRAVENOUS
  Filled 2024-04-02: qty 12.5

## 2024-04-02 NOTE — H&P (Addendum)
 History and Physical  [name]:[id number] DOB: 1946-04-22 DOA: 04/02/2024  Referring physician: Dr. Pamella, EDP  PCP: Hillman Bare, MD  Outpatient Specialists: Medical oncology, neurology, orthopedic surgery. Patient coming from: SNF  Chief Complaint: Altered mental status.  HPI: [name] is a 78 y.o. 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  The patient was discharged to SNF.  For the last 24 hours she has been more confused and more somnolent.  Previously able to feed herself and talk.  Her son last saw her at the SNF yesterday around 7 to 8 PM and she appeared fine.  He went to visit her this morning and she was asleep.  He went to work and later, received a call from SNF stating that she was going to the hospital because of lethargy.  EMS was activated.  Upon EMS arrival, the patient vital signs were stable.  And the patient was brought to the ER for further evaluation.  EDP discussed the case with neurology who recommended admission by the medicine team and EEG tomorrow.  Admitted by [facility], hospitalist service.  ED Course: Temperature 101.1.  BP 157/97, pulse 105, respiration rate 24, O2 saturation 97% on room air.  Review of Systems: Review of systems as noted in the HPI. All other systems reviewed and are negative.   Past Medical History:  Diagnosis Date   Hypertension    Past Surgical History:  Procedure Laterality Date   BREAST EXCISIONAL BIOPSY Bilateral    MEDIAL PARTIAL KNEE REPLACEMENT Bilateral 06/07/2012    Social History:  reports that she has never smoked. She has never used smokeless tobacco. She reports that she does not drink alcohol and does not use drugs.   Allergies  Allergen Reactions   Shellfish  Protein-Containing Drug Products Other (See Comments)    Shrimp and lobster--severe swelling/itching   Carisoprodol-Aspirin -Codeine Other (See Comments)    Unknown    Codeine Nausea And Vomiting   Doxycycline Nausea Only   Tetracycline Nausea Only    Family History  Problem Relation Age of Onset   Stroke Mother    Stroke Father    Stroke Sister    Hypertension Brother       Prior to Admission medications   Medication Sig Start Date End Date Taking? Authorizing Provider  albuterol  (VENTOLIN  HFA) 108 (90 Base) MCG/ACT inhaler Inhale 1-2 puffs into the lungs every 6 (six) hours as needed for wheezing or shortness of breath. Patient not taking: Reported on 03/24/2024 01/08/21   Stuart Vernell Norris, PA-C  amLODipine (NORVASC) 10 MG tablet Take 1 tablet (10 mg total) by mouth daily. 03/28/24   Remi Pippin, NP  diclofenac Sodium (VOLTAREN) 1 % GEL Apply 1 g topically 2 (two) times daily. 12/13/22   [provider]  donepezil (ARICEPT) 10 MG tablet Take 10 mg by mouth daily. 03/08/23   [provider]  levETIRAcetam (KEPPRA) 500 MG tablet Take 1 tablet (500 mg total) by mouth 2 (two) times daily. 03/27/24   Remi Pippin, NP  lidocaine  (HM LIDOCAINE  PATCH) 4 % Place 1 patch onto the skin daily. 01/16/23   Ladora Congress, PA  losartan-hydrochlorothiazide (HYZAAR) 50-12.5 MG tablet Take 1 tablet by mouth daily. 02/09/24   [provider]  omeprazole (PRILOSEC) 40 MG capsule Take 40 mg by mouth daily.  08/01/14  [provider]  prednisoLONE acetate (PRED FORTE) 1 % ophthalmic suspension Place 1 drop into the right eye 4 (four) times daily. 09/22/19   [provider]  rosuvastatin  (CRESTOR ) 20 MG tablet Take 20 mg by mouth daily.    [provider]    Physical Exam: BP (!) 154/92   Pulse (!) 101   Temp (!) 101.1 F (38.4 C) (Axillary)   Resp 18   SpO2 98%   General: 78 y.o. year-old female well developed well nourished in no acute distress.   Somnolent. Cardiovascular: Regular rate and rhythm with no rubs or gallops.  No thyromegaly or JVD noted.  No lower extremity edema. 2/4 pulses in all 4 extremities. Respiratory: Faint rales at bases.  Poor inspiratory effort. Abdomen: Soft nontender nondistended with normal bowel sounds x4 quadrants. Muskuloskeletal: No cyanosis or clubbing noted bilaterally Neuro: CN II-XII intact, strength, sensation, reflexes Skin: No ulcerative lesions noted or rashes Psychiatry: Judgement and insight appear altered. Mood is appropriate for condition and setting          Labs on Admission:  Basic Metabolic Panel: Recent Labs  Lab 03/27/24 0126 04/02/24 1553 04/02/24 1623  NA 133* 132* 133*  K 3.2* 5.1 5.7*  CL 103 98 100  CO2 19* 23  --   GLUCOSE 97 112* 109*  BUN 15 20 29*  CREATININE 1.11* 1.57* 1.60*  CALCIUM  8.2* 8.9  --    Liver Function Tests: Recent Labs  Lab 04/02/24 1553  AST 50*  ALT 13  ALKPHOS 67  BILITOT 1.8*  PROT 7.7  ALBUMIN 3.2*   No results for input(s): LIPASE, AMYLASE in the last 168 hours. No results for input(s): AMMONIA in the last 168 hours. CBC: Recent Labs  Lab 03/27/24 0126 04/02/24 1553 04/02/24 1623  WBC 7.2 9.9  --   NEUTROABS  --  8.0*  --   HGB 14.0 16.7* 17.7*  HCT 42.7 50.7* 52.0*  MCV 87.1 86.7  --   PLT 255 263  --    Cardiac Enzymes: No results for input(s): CKTOTAL, CKMB, CKMBINDEX, TROPONINI in the last 168 hours.  BNP (last 3 results) No results for input(s): BNP in the last 8760 hours.  ProBNP (last 3 results) No results for input(s): PROBNP in the last 8760 hours.  CBG: Recent Labs  Lab 04/02/24 1641  GLUCAP 97    Radiological Exams on Admission: DG Chest Portable 1 View Result Date: 04/02/2024 EXAM: 1 AP VIEW(S) XRAY OF THE CHEST 04/02/2024 04:10:00 PM COMPARISON: 02/25/2019 CLINICAL HISTORY: ?pna pna FINDINGS: LUNGS AND PLEURA: Lower lung predominant mild interstitial thickening is nonspecific.  Left greater than right basilar airspace disease. No pleural effusion. No pneumothorax. HEART AND MEDIASTINUM: Normal heart size for AP portable technique. Patient rotated minimally right. BONES AND SOFT TISSUES: No acute osseous abnormality. Exam limited by AP portable technique and patient body habitus. IMPRESSION: 1. Limited AP portable radiographs due to patient body habitus 2. Left greater than right base airspace disease. This could represent atelectasis. especially at the left lung base, infection or aspiration cannot be excluded. Consider PA and lateral radiographs if possible, Electronically signed by: Rockey Kilts MD 04/02/2024 04:42 PM EST RP Workstation: [id number]   CT HEAD WO CONTRAST Result Date: 04/02/2024 EXAM: CT HEAD WITHOUT CONTRAST 04/02/2024 03:56:34 PM TECHNIQUE: CT of the head was performed without the administration of intravenous contrast. Automated exposure control, iterative reconstruction, and/or weight based adjustment of the mA/kV was utilized to reduce the radiation dose to  as low as reasonably achievable. COMPARISON: Head CT and MRI 03/23/2024. CLINICAL HISTORY: Neuro deficit, acute, stroke suspected. Altered mental status. History of seizures. FINDINGS: BRAIN AND VENTRICLES: There is persistent hypodensity/mild edema in the right frontoparietal operculum corresponding to the abnormal T2 hyperintensity and enhancement on the recent prior MRI. Small mildly hyperdense hemorrhage in this region on the prior CT is no longer clearly evident. No new intracranial hemorrhage, acute large territory infarct, midline shift, hydrocephalus, or extra-axial fluid collection is identified. Confluent hypodensities elsewhere in the cerebral white matter bilaterally are similar to the prior CT and nonspecific but compatible with extensive chronic small vessel ischemic disease. There is heterogeneous hypodensity in the deep gray nuclei bilaterally, including in the right thalamus where enhancement  was shown on the recent MRI. There is mild cerebral atrophy. Calcified atherosclerosis at the skull base. ORBITS: Bilateral cataract extraction. SINUSES: Small right mastoid effusion. The included paranasal sinuses are clear. SOFT TISSUES AND SKULL: No acute soft tissue abnormality. No skull fracture. IMPRESSION: 1. Persistent hypodensity/mild edema in the right frontoparietal operculum corresponding to signal abnormality on the recent MRI where concern was raised for possible infiltrative primary tumor. 2. No evidence of a new intracranial abnormality 3. Extensive chronic small vessel ischemic disease. Electronically signed by: Dasie Hamburg MD 04/02/2024 04:32 PM EST RP Workstation: [id number]    EKG: I independently viewed the EKG done and my findings are as followed: Sinus rhythm rate of 95.  Nonspecific ST-T changes.  QTc 483  Assessment/Plan Present on Admission:  AMS (altered mental status)  Principal Problem:   AMS (altered mental status)  Acute metabolic encephalopathy, suspect secondary to developing sepsis, POA Treat underlying conditions Reorient as needed Fall and aspiration precautions. Follow EEG. Follow VBG.  Sepsis secondary to HCAP, POA Presented with fever with Tmax 101.1, tachycardia HR 103, tachypnea RR 24 Received IV cefepime and IV vancomycin in the ER Received IV fluids bolus NS 1 L x 1 Continue IV fluid maintenance, judiciously to avoid volume overload and respiratory compromise. NS at 100 cc/h x 1 day. Follow peripheral blood cultures x 2 and lactic acid. Follow MRSA screening test. Continue IV cefepime.  Hypovolemic hyponatremia Serum sodium 133 NS at 100 cc/h x 1 day Repeat chemistry panel in the morning.  Hypokalemia Serum potassium 5.7 Lokelma 10 g twice daily x 2 doses  AKI, suspect prerenal in the setting of dehydration from poor oral intake Creatinine at baseline 0.9 eGFR 60 Presented with creatinine of 1.60 with GFR of 33. Avoid nephrotoxic  agents, dehydration, and hypotension.  Elevated liver chemistries AST and T. bili elevated Avoid hepatotoxic agents Repeat CMP in the morning  Erythrocytosis, unspecified Hemoglobin 17.7 with hematocrit of 52 Possibly from hemoconcentration IV fluid hydration Repeat CBC in the morning.  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 Resume home Keppra Follow EEG. Seizure precautions IV Ativan as needed for breakthrough seizures  Poststroke dysphagia Resume dysphagia 3 diet Aspiration precautions.  Generalized weakness PT OT evaluation Fall precautions    Critical care time: 55 minutes.    DVT prophylaxis: Subcu Lovenox  daily.  Code Status: Full code, confirmed by her son at bedside.  Family Communication: The patient's son at bedside.  Disposition Plan: Admitted to telemetry unit.  Consults called: EDP curbsided with neurology.  Admission status: Inpatient status.   Status is: Inpatient The patient requires at least 2 midnights for further evaluation and treatment of present condition.   Terry LOISE Hurst MD [facility] Pager [phone]  If 7PM-7AM, please contact night-coverage [email or web address] Password [email or web address]  04/02/2024, 7:52 PM

## 2024-04-02 NOTE — ED Provider Notes (Signed)
 [place] EMERGENCY DEPARTMENT AT [facility] Provider Note   CSN: [id number] Arrival date & time: 04/02/24  1530     Patient presents with: Altered Mental Status   [name] is a 78 y.o. female.  With a history of intracranial bleeding, brain mass who presents to the ED for altered mental status.  Patient recently seen in this ED for hemorrhagic stroke.  Also noted to have brain mass but has not been evaluated by oncology yet.  Discharge to skilled nursing facility a week and a half ago.  Over the last 24 hours has had increased confusion and weakness new from baseline.  She was discharged on Keppra last time she was here.  Son reports she was conversing with him last night    Altered Mental Status      Prior to Admission medications   Medication Sig Start Date End Date Taking? Authorizing Provider  albuterol  (VENTOLIN  HFA) 108 (90 Base) MCG/ACT inhaler Inhale 1-2 puffs into the lungs every 6 (six) hours as needed for wheezing or shortness of breath. Patient not taking: Reported on 03/24/2024 01/08/21   Stuart Vernell Norris, PA-C  amLODipine (NORVASC) 10 MG tablet Take 1 tablet (10 mg total) by mouth daily. 03/28/24   Remi Pippin, NP  diclofenac Sodium (VOLTAREN) 1 % GEL Apply 1 g topically 2 (two) times daily. 12/13/22   [provider]  donepezil (ARICEPT) 10 MG tablet Take 10 mg by mouth daily. 03/08/23   [provider]  levETIRAcetam (KEPPRA) 500 MG tablet Take 1 tablet (500 mg total) by mouth 2 (two) times daily. 03/27/24   Remi Pippin, NP  lidocaine  (HM LIDOCAINE  PATCH) 4 % Place 1 patch onto the skin daily. 01/16/23   Ladora Congress, PA  losartan-hydrochlorothiazide (HYZAAR) 50-12.5 MG tablet Take 1 tablet by mouth daily. 02/09/24   [provider]  omeprazole (PRILOSEC) 40 MG capsule Take 40 mg by mouth daily.  08/01/14   [provider]  prednisoLONE acetate (PRED FORTE) 1 % ophthalmic suspension Place 1 drop into the right eye 4  (four) times daily. 09/22/19   [provider]  rosuvastatin  (CRESTOR ) 20 MG tablet Take 20 mg by mouth daily.    [provider]    Allergies: Shellfish protein-containing drug products, Carisoprodol-aspirin -codeine, Codeine, Doxycycline, and Tetracycline    Review of Systems  Updated Vital Signs BP (!) 154/92   Pulse (!) 101   Temp (!) 101.1 F (38.4 C) (Axillary)   Resp 18   SpO2 98%   Physical Exam Vitals and nursing note reviewed.  HENT:     Head: Normocephalic and atraumatic.  Eyes:     Pupils: Pupils are equal, round, and reactive to light.  Cardiovascular:     Rate and Rhythm: Normal rate and regular rhythm.  Pulmonary:     Effort: Pulmonary effort is normal.     Breath sounds: Normal breath sounds.  Abdominal:     Palpations: Abdomen is soft.     Tenderness: There is no abdominal tenderness.  Skin:    General: Skin is warm and dry.  Neurological:     Mental Status: She is alert.     Comments: Awake alert Able to say yes no Retracts all 4 extremities to pain Redemonstrated left upper extremity weakness compared to the right No gaze deviation  Psychiatric:        Mood and Affect: Mood normal.     (all labs ordered are listed, but only abnormal results are  displayed) Labs Reviewed  CBC - Abnormal; Notable for the following components:      Result Value   RBC 5.85 (*)    Hemoglobin 16.7 (*)    HCT 50.7 (*)    All other components within normal limits  DIFFERENTIAL - Abnormal; Notable for the following components:   Neutro Abs 8.0 (*)    Abs Immature Granulocytes 0.08 (*)    All other components within normal limits  COMPREHENSIVE METABOLIC PANEL WITH GFR - Abnormal; Notable for the following components:   Sodium 132 (*)    Glucose, Bld 112 (*)    Creatinine, Ser 1.57 (*)    Albumin 3.2 (*)    AST 50 (*)    Total Bilirubin 1.8 (*)    GFR, Estimated 33 (*)    All other components within normal limits  I-STAT CHEM 8, ED - Abnormal;  Notable for the following components:   Sodium 133 (*)    Potassium 5.7 (*)    BUN 29 (*)    Creatinine, Ser 1.60 (*)    Glucose, Bld 109 (*)    Calcium , Ion 1.01 (*)    Hemoglobin 17.7 (*)    HCT 52.0 (*)    All other components within normal limits  RESP PANEL BY RT-PCR (RSV, FLU A&B, COVID)  RVPGX2  PROTIME-INR  APTT  URINALYSIS, ROUTINE W REFLEX MICROSCOPIC  LEVETIRACETAM LEVEL  CBC  CREATININE, SERUM  CBG MONITORING, ED    EKG: None  Radiology: DG Chest Portable 1 View Result Date: 04/02/2024 EXAM: 1 AP VIEW(S) XRAY OF THE CHEST 04/02/2024 04:10:00 PM COMPARISON: 02/25/2019 CLINICAL HISTORY: ?pna pna FINDINGS: LUNGS AND PLEURA: Lower lung predominant mild interstitial thickening is nonspecific. Left greater than right basilar airspace disease. No pleural effusion. No pneumothorax. HEART AND MEDIASTINUM: Normal heart size for AP portable technique. Patient rotated minimally right. BONES AND SOFT TISSUES: No acute osseous abnormality. Exam limited by AP portable technique and patient body habitus. IMPRESSION: 1. Limited AP portable radiographs due to patient body habitus 2. Left greater than right base airspace disease. This could represent atelectasis. especially at the left lung base, infection or aspiration cannot be excluded. Consider PA and lateral radiographs if possible, Electronically signed by: Rockey Kilts MD 04/02/2024 04:42 PM EST RP Workstation: [id number]   CT HEAD WO CONTRAST Result Date: 04/02/2024 EXAM: CT HEAD WITHOUT CONTRAST 04/02/2024 03:56:34 PM TECHNIQUE: CT of the head was performed without the administration of intravenous contrast. Automated exposure control, iterative reconstruction, and/or weight based adjustment of the mA/kV was utilized to reduce the radiation dose to as low as reasonably achievable. COMPARISON: Head CT and MRI 03/23/2024. CLINICAL HISTORY: Neuro deficit, acute, stroke suspected. Altered mental status. History of seizures. FINDINGS: BRAIN  AND VENTRICLES: There is persistent hypodensity/mild edema in the right frontoparietal operculum corresponding to the abnormal T2 hyperintensity and enhancement on the recent prior MRI. Small mildly hyperdense hemorrhage in this region on the prior CT is no longer clearly evident. No new intracranial hemorrhage, acute large territory infarct, midline shift, hydrocephalus, or extra-axial fluid collection is identified. Confluent hypodensities elsewhere in the cerebral white matter bilaterally are similar to the prior CT and nonspecific but compatible with extensive chronic small vessel ischemic disease. There is heterogeneous hypodensity in the deep gray nuclei bilaterally, including in the right thalamus where enhancement was shown on the recent MRI. There is mild cerebral atrophy. Calcified atherosclerosis at the skull base. ORBITS: Bilateral cataract extraction. SINUSES: Small right mastoid effusion. The included paranasal sinuses  are clear. SOFT TISSUES AND SKULL: No acute soft tissue abnormality. No skull fracture. IMPRESSION: 1. Persistent hypodensity/mild edema in the right frontoparietal operculum corresponding to signal abnormality on the recent MRI where concern was raised for possible infiltrative primary tumor. 2. No evidence of a new intracranial abnormality 3. Extensive chronic small vessel ischemic disease. Electronically signed by: Dasie Hamburg MD 04/02/2024 04:32 PM EST RP Workstation: [id number]     .Critical Care  Performed by: Pamella Ozell LABOR, DO Authorized by: Pamella Ozell LABOR, DO   Critical care provider statement:    Critical care time (minutes):  30   Critical care was necessary to treat or prevent imminent or life-threatening deterioration of the following conditions:  CNS failure or compromise   Critical care was time spent personally by me on the following activities:  Development of treatment plan with patient or surrogate, discussions with consultants, evaluation of patient's  response to treatment, examination of patient, ordering and review of laboratory studies, ordering and review of radiographic studies, ordering and performing treatments and interventions, pulse oximetry, re-evaluation of patient's condition, review of old charts and obtaining history from patient or surrogate   I assumed direction of critical care for this patient from another provider in my specialty: no     Care discussed with: admitting provider      Medications Ordered in the ED  vancomycin (VANCOREADY) IVPB 2000 mg/400 mL (has no administration in time range)  acetaminophen  (OFIRMEV ) IV 1,000 mg (1,000 mg Intravenous New Bag/Given 04/02/24 2009)  enoxaparin  (LOVENOX ) injection 40 mg (has no administration in time range)  acetaminophen  (TYLENOL ) tablet 500 mg (has no administration in time range)  prochlorperazine (COMPAZINE) injection 5 mg (has no administration in time range)  melatonin tablet 5 mg (has no administration in time range)  polyethylene glycol (MIRALAX / GLYCOLAX) packet 17 g (has no administration in time range)  LORazepam (ATIVAN) injection 2 mg (has no administration in time range)  levETIRAcetam (KEPPRA) tablet 500 mg (has no administration in time range)  rosuvastatin  (CRESTOR ) tablet 20 mg (has no administration in time range)  amLODipine (NORVASC) tablet 10 mg (has no administration in time range)  sodium chloride  0.9 % bolus 1,000 mL (0 mLs Intravenous Stopped 04/02/24 1753)  levETIRAcetam (KEPPRA) undiluted injection 1,000 mg (1,000 mg Intravenous Given 04/02/24 1625)  ceFEPIme (MAXIPIME) 2 g in sodium chloride  0.9 % 100 mL IVPB (0 g Intravenous Stopped 04/02/24 2006)    Clinical Course as of 04/02/24 2011  Mon Apr 02, 2024  1948 Discussed with admitting hospitalist who accept patient for admission [MP]    Clinical Course User Index [MP] Pamella Ozell LABOR, DO                                 Medical Decision Making 78 year old female with history as above  returns for acute mental status changes.  Recently admitted after hemorrhagic stroke discharged to nursing facility.  Has known brain lesion that has not been differentiated by oncology yet.  Has been on Keppra for seizures.  No seizure activity reported today.  Global confusion no longer conversing but able to say yes no respond to pain.  No apparent new focal neurologic deficits on my exam based on comparison with prior exam before discharge.  CT head shows stable brain lesion with no acute bleeding.  While she was here she did begin to exhibit a fever of 101.  Chest x-ray concerning  for aspiration pneumonia.  Will cover with antibiotics and admit to medicine.  Discussed with neurology Dr. Wallie who does not feel need for a repeat MRI is necessary at this time.  He recommends EEG in the morning.  I would let the hospitalist team Dr. Shona know this plan  Amount and/or Complexity of Data Reviewed Labs: ordered. Radiology: ordered.  Risk Prescription drug management. Decision regarding hospitalization.        Final diagnoses:  Healthcare-associated pneumonia  Altered mental status, unspecified altered mental status type  History of stroke    ED Discharge Orders     None          Pamella Ozell LABOR, DO 04/02/24 2011

## 2024-04-02 NOTE — ED Notes (Signed)
 Pt had incontinent episode. Pt provided with peri care and full linen change.

## 2024-04-02 NOTE — ED Notes (Signed)
 Two attempts by the phlebotomists, only able to collect one set of blood cultures. RN is aware

## 2024-04-02 NOTE — ED Notes (Signed)
 Patient transported to CT

## 2024-04-02 NOTE — ED Triage Notes (Signed)
 Pt BIB EMS from [facility] for AMS since Saturday according to facility. EMS reports pt has a hx of seizures.   EMS Vitals  BP 130/90 HR 100 CBG 127

## 2024-04-02 NOTE — TOC CM/SW Note (Signed)
 TOC consult received as patient admitted from a SNF. Per notes, patient recently discharged to [facility] on 11/11. Clinicals sent via Epic, awaiting decision regarding patient's ability to return to facility.   Merilee Batty, MSN, RN Case Management [phone]

## 2024-04-03 ENCOUNTER — Inpatient Hospital Stay (HOSPITAL_COMMUNITY)

## 2024-04-03 DIAGNOSIS — R569 Unspecified convulsions: Secondary | ICD-10-CM | POA: Diagnosis not present

## 2024-04-03 DIAGNOSIS — R4182 Altered mental status, unspecified: Secondary | ICD-10-CM | POA: Diagnosis not present

## 2024-04-03 LAB — CBC WITH DIFFERENTIAL/PLATELET
Abs Immature Granulocytes: 0.07 K/uL (ref 0.00–0.07)
Basophils Absolute: 0.1 K/uL (ref 0.0–0.1)
Basophils Relative: 1 %
Eosinophils Absolute: 0 K/uL (ref 0.0–0.5)
Eosinophils Relative: 0 %
HCT: 49.1 % — ABNORMAL HIGH (ref 36.0–46.0)
Hemoglobin: 16.1 g/dL — ABNORMAL HIGH (ref 12.0–15.0)
Immature Granulocytes: 1 %
Lymphocytes Relative: 16 %
Lymphs Abs: 1.6 K/uL (ref 0.7–4.0)
MCH: 29.1 pg (ref 26.0–34.0)
MCHC: 32.8 g/dL (ref 30.0–36.0)
MCV: 88.6 fL (ref 80.0–100.0)
Monocytes Absolute: 0.9 K/uL (ref 0.1–1.0)
Monocytes Relative: 9 %
Neutro Abs: 7.4 K/uL (ref 1.7–7.7)
Neutrophils Relative %: 73 %
Platelets: 221 K/uL (ref 150–400)
RBC: 5.54 MIL/uL — ABNORMAL HIGH (ref 3.87–5.11)
RDW: 14.6 % (ref 11.5–15.5)
WBC: 10 K/uL (ref 4.0–10.5)
nRBC: 0 % (ref 0.0–0.2)

## 2024-04-03 LAB — COMPREHENSIVE METABOLIC PANEL WITH GFR
ALT: 10 U/L (ref 0–44)
AST: 38 U/L (ref 15–41)
Albumin: 3.1 g/dL — ABNORMAL LOW (ref 3.5–5.0)
Alkaline Phosphatase: 62 U/L (ref 38–126)
Anion gap: 17 — ABNORMAL HIGH (ref 5–15)
BUN: 19 mg/dL (ref 8–23)
CO2: 20 mmol/L — ABNORMAL LOW (ref 22–32)
Calcium: 8.6 mg/dL — ABNORMAL LOW (ref 8.9–10.3)
Chloride: 96 mmol/L — ABNORMAL LOW (ref 98–111)
Creatinine, Ser: 1.33 mg/dL — ABNORMAL HIGH (ref 0.44–1.00)
GFR, Estimated: 40 mL/min — ABNORMAL LOW (ref >60)
Glucose, Bld: 84 mg/dL (ref 70–99)
Potassium: 3.9 mmol/L (ref 3.5–5.1)
Sodium: 133 mmol/L — ABNORMAL LOW (ref 135–145)
Total Bilirubin: 1.4 mg/dL — ABNORMAL HIGH (ref 0.0–1.2)
Total Protein: 7.8 g/dL (ref 6.5–8.1)

## 2024-04-03 LAB — MAGNESIUM: Magnesium: 2 mg/dL (ref 1.7–2.4)

## 2024-04-03 LAB — PHOSPHORUS: Phosphorus: 5.7 mg/dL — ABNORMAL HIGH (ref 2.5–4.6)

## 2024-04-03 MED ORDER — METRONIDAZOLE 500 MG/100ML IV SOLN
500.0000 mg | Freq: Two times a day (BID) | INTRAVENOUS | Status: DC
  Administered 2024-04-03 (×2): 500 mg via INTRAVENOUS
  Filled 2024-04-03 (×2): qty 100

## 2024-04-03 NOTE — ED Notes (Signed)
Breathing is even and unlabored.

## 2024-04-03 NOTE — ED Notes (Signed)
 Per speech pt can have a full liquid diet, straws are okay and pills can be taken whole in applesauce or pudding.

## 2024-04-03 NOTE — Evaluation (Signed)
 Clinical/Bedside Swallow Evaluation Patient Details  Name: [name] MRN: [id number] Date of Birth: 25-Jul-1945  Today's Date: 04/03/2024 Time: SLP Start Time (ACUTE ONLY): 1345 SLP Stop Time (ACUTE ONLY): 1413 SLP Time Calculation (min) (ACUTE ONLY): 28 min  Past Medical History:  Past Medical History:  Diagnosis Date   Hypertension    Past Surgical History:  Past Surgical History:  Procedure Laterality Date   BREAST EXCISIONAL BIOPSY Bilateral    MEDIAL PARTIAL KNEE REPLACEMENT Bilateral 06/07/2012   HPI:  [name] is an 78 yo female who presented to ED from SNF with somnolence 11/17. Dx acute metabolic encephalopathy, sepsis secondary to HAP. Recent admission 11/ 7 -11/ 11 for seizures, cognitive changes, dysphagia and dysarthria. Dx R frontal infiltrative high-grade lesion, suspicious for tumor - nsgy recommended repeat MRI x six weeks. MBS 03/26/24 moderate pharyngeal  dysphagia with penetration thin liquids to the vocal folds; left oral residue.  Dysphagia 3/thin liquids recommended with cued cough. PMHx HTN, osteoarthritis, and memory loss weakness.    Assessment / Plan / Recommendation  Clinical Impression  PLAN: full liquid diet for now; meds whole in puree. Assist with feeding; feed only when alert.    Pt drowsy, requiring prompts to stay awake for assessment.  Difficulty following commands and verbalizing, smiling only minimally. Sons at bedside. She accepted ice chips, sips of water from a straw, and several bites of applesauce with adequate attention to bolus and palpable swallow response.  There were no obvious s/s of aspiration.  She did not follow prompts for volitional cough due to mentation.  Will start with full liquids with anticipation of advancing diet back to baseline once MS improves - aspiration as contributor cannot be excluded. Will follow.   SLP Visit Diagnosis: Dysphagia, oropharyngeal phase (R13.12);Dysarthria and anarthria (R47.1);Cognitive  communication deficit (R41.841)    Aspiration Risk  Mild aspiration risk    Diet Recommendation    (full liquids)  Medication Administration: Whole meds with puree    Other  Recommendations Oral Care Recommendations: Oral care BID     Assistance Recommended at Discharge    Functional Status Assessment Patient has had a recent decline in their functional status and demonstrates the ability to make significant improvements in function in a reasonable and predictable amount of time.  Frequency and Duration min 2x/week  2 weeks       Prognosis Prognosis for improved oropharyngeal function: Good Barriers to Reach Goals: Cognitive deficits      Swallow Study   General HPI: [name] is an 78 yo female who presented to ED from SNF with somnolence 11/17. Dx acute metabolic encephalopathy, sepsis secondary to HAP. Recent admission 11/ 7 -11/ 11 for seizures, cognitive changes, dysphagia and dysarthria. Dx R frontal infiltrative high-grade lesion, suspicious for tumor - nsgy recommended repeat MRI x six weeks. MBS 03/26/24 moderate pharyngeal  dysphagia with penetration thin liquids to the vocal folds; left oral residue.  Dysphagia 3/thin liquids recommended. PMHx HTN, osteoarthritis, and memory loss weakness. Type of Study: Bedside Swallow Evaluation Previous Swallow Assessment: see HPI Diet Prior to this Study: Dysphagia 3 (mechanical soft);Thin liquids (Level 0) Temperature Spikes Noted: No Respiratory Status: Nasal cannula History of Recent Intubation: No Behavior/Cognition: Lethargic/Drowsy Oral Cavity Assessment: Within Functional Limits Oral Care Completed by SLP: No Oral Cavity - Dentition: Adequate natural dentition Self-Feeding Abilities: Needs assist Patient Positioning: Upright in bed Baseline Vocal Quality: Normal Volitional Cough: Cognitively unable to elicit Volitional Swallow: Unable to elicit    Oral/Motor/Sensory  Function Facial ROM: Reduced left;Suspected CN VII  (facial) dysfunction Facial Symmetry: Abnormal symmetry left;Suspected CN VII (facial) dysfunction Lingual ROM: Reduced left;Suspected CN XII (hypoglossal) dysfunction Lingual Symmetry: Abnormal symmetry left;Suspected CN XII (hypoglossal) dysfunction   Ice Chips Ice chips: Within functional limits   Thin Liquid Thin Liquid: Within functional limits    Nectar Thick Nectar Thick Liquid: Not tested   Honey Thick Honey Thick Liquid: Not tested   Puree Puree: Within functional limits   Solid     Solid: Not tested      [name] 04/03/2024,2:41 PM  Palma L. Vona, MA CCC/SLP Clinical Specialist - Acute Care SLP Acute Rehabilitation Services Office number [phone]

## 2024-04-03 NOTE — Procedures (Signed)
 Patient Name: [name]  MRN: [id number]  Epilepsy Attending: Arlin MALVA Krebs  Referring Physician/Provider: Shona Terry SAILOR, DO  Date: 04/03/2024 Duration: 22.39 mins  Patient history: 78 year old female with altered mental status.  EEG to evaluate for seizure.  Level of alertness: Awake  AEDs during EEG study: Keppra  Technical aspects: This EEG study was done with scalp electrodes positioned according to the 10-20 International system of electrode placement. Electrical activity was reviewed with band pass filter of 1-70Hz , sensitivity of 7 uV/mm, display speed of 14mm/sec with a 60Hz  notched filter applied as appropriate. EEG data were recorded continuously and digitally stored.  Video monitoring was available and reviewed as appropriate.  Description: EEG showed continuous generalized 3 to 7 Hz theta-delta slowing. Hyperventilation and photic stimulation were not performed.     ABNORMALITY - Continuous slow, generalized  IMPRESSION: This study is suggestive of generalized cerebral dysfunction (encephalopathy). No seizures or epileptiform discharges were seen throughout the recording.  [name]

## 2024-04-03 NOTE — ED Notes (Signed)
 Messaged Dr. Sonjia to see if he wanted to order IV keppra.

## 2024-04-03 NOTE — Evaluation (Signed)
 Occupational Therapy Evaluation Patient Details Name: [name] MRN: [id number] DOB: Feb 21, 1946 Today's Date: 04/03/2024   History of Present Illness   [name] is a 78 y.o. female presented to ED with AMS and somnolence. Found to have sepsis due to PNA. 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     Clinical Impressions This 78 yo female admitted with above presents to acute OT with PLOF of needing increased A since last admission (D/C'd on 11/11 to SNF). Currently she is somewhat lethargic, but will interact intermittently, she is total A at bed level for all basic ADLs and has decreased sitting balance. She will continue to benefit from acute OT with follow up from continued inpatient follow up therapy, <3 hours/day. Sons in room were interested in talking to Pallative services so attending was sent a secure chat and stated he would put in the order.      If plan is discharge home, recommend the following:   A lot of help with bathing/dressing/bathroom;Two people to help with walking and/or transfers;Assistance with feeding;Direct supervision/assist for medications management;Help with stairs or ramp for entrance;Assist for transportation;Direct supervision/assist for financial management     Functional Status Assessment   Patient has had a recent decline in their functional status and demonstrates the ability to make significant improvements in function in a reasonable and predictable amount of time.     Equipment Recommendations   Other (comment) (TBD next venue)      Precautions/Restrictions   Precautions Precautions: Fall Recall of Precautions/Restrictions: Impaired Restrictions Weight Bearing Restrictions Per Provider Order: No      Mobility Bed Mobility Overal bed mobility: Needs Assistance Bed Mobility: Supine to Sit, Sit to Supine     Supine to sit: Max assist, HOB elevated Sit to supine: Total assist            Balance Overall balance assessment: Needs assistance Sitting-balance support: Bilateral upper extremity supported, Feet unsupported Sitting balance-Leahy Scale: Poor Sitting balance - Comments: posterior lean                                   ADL either performed or assessed with clinical judgement   ADL Overall ADL's : Needs assistance/impaired Eating/Feeding: NPO     Grooming Details (indicate cue type and reason): Can reach face but does this spontaneously not on command Upper Body Bathing: Total assistance;Bed level   Lower Body Bathing: Total assistance;Bed level   Upper Body Dressing : Total assistance;Bed level   Lower Body Dressing: Total assistance;Bed level                       Vision Baseline Vision/History: 1 Wears glasses (reading) Ability to See in Adequate Light: 0 Adequate Patient Visual Report: No change from baseline              Pertinent Vitals/Pain Pain Assessment Breathing: normal Negative Vocalization: occasional moan/groan, low speech, negative/disapproving quality Facial Expression: sad, frightened, frown Body Language: relaxed Consolability: no need to console PAINAD Score: 2 Pain Intervention(s): Limited activity within patient's tolerance, Monitored during session, Repositioned     Extremity/Trunk Assessment Upper Extremity Assessment Upper Extremity Assessment: Right hand dominant;LUE deficits/detail;Difficult to assess due to impaired cognition RUE Deficits / Details:  Moves both UEs spontaneously to bring to face--but not always on command,  able to hold RUE up for ~5 seconds when placed for her RUE Coordination: decreased fine motor;decreased gross motor LUE Deficits / Details: Moves both UEs spontaneously to bring  to face--but not always on command, not able to hold RUE up when placed for her LUE Coordination: decreased fine motor;decreased gross motor           Communication Communication Communication: Impaired Factors Affecting Communication: Reduced clarity of speech   Cognition Arousal: Lethargic Behavior During Therapy: WFL for tasks assessed/performed Cognition: History of cognitive impairments             OT - Cognition Comments: Could not tell me her boys names, increased time to follow commands when she did follow them                 Following commands: Impaired Following commands impaired: Follows one step commands with increased time     Cueing    Cueing Techniques: Verbal cues;Tactile cues;Gestural cues              Home Living Family/patient expects to be discharged to:: Skilled nursing facility (MD is putting in pallative consult) Living Arrangements: Children Available Help at Discharge: Family;Available PRN/intermittently Type of Home: Apartment Home Access: Level entry     Home Layout: One level     Bathroom Shower/Tub: Tub/shower unit;Sponge bathes at baseline   Bathroom Toilet: Standard Bathroom Accessibility: No   Home Equipment: Agricultural Consultant (2 wheels);Rollator (4 wheels)          Prior Functioning/Environment Prior Level of Function : Needs assist;History of Falls (last six months)             Mobility Comments: very sedentary at baseline; walks with RW, however parks it and goes without it at times; recent falls with each time she did not have the RW with her (from last admission a week ago) ADLs Comments: modified independent basic ADLs; sponge bathes; memory deficits (from last admission a week ago)    OT Problem List: Decreased strength;Decreased range of motion;Decreased activity tolerance;Impaired balance (sitting and/or standing);Decreased coordination;Impaired UE functional use   OT Treatment/Interventions:  Self-care/ADL training;Therapeutic activities;Therapeutic exercise;Patient/family education;Balance training;DME and/or AE instruction      OT Goals(Current goals can be found in the care plan section)   Acute Rehab OT Goals Patient Stated Goal: pt unable to state; sons want to know from MDs what is going on and how the tumor that was discovered last admission may be progressing OT Goal Formulation: With family Time For Goal Achievement: 04/17/24 Potential to Achieve Goals: Fair   OT Frequency:  Min 2X/week       AM-PAC OT 6 Clicks Daily Activity     Outcome Measure Help from another person eating meals?: Total Help from another person taking care of personal grooming?: Total Help from another person toileting, which includes using toliet, bedpan, or urinal?: Total Help from another person bathing (including washing, rinsing, drying)?: Total Help from another person to put on and taking off regular upper body clothing?: Total Help from another person to put on and taking off regular lower body clothing?: Total 6 Click Score: 6   End of Session    Activity Tolerance: Patient limited by lethargy Patient left: in bed;with call bell/phone within reach;with family/visitor present (Bil side rails on stretcher up)  OT Visit Diagnosis: Other abnormalities of gait and mobility (R26.89);Muscle weakness (generalized) (M62.81);Other symptoms and signs involving  the nervous system (R29.898);Other symptoms and signs involving cognitive function                Time: 8683-8650 OT Time Calculation (min): 33 min Charges:  OT General Charges $OT Visit: 1 Visit OT Evaluation $OT Eval Moderate Complexity: 1 Mod OT Treatments $Self Care/Home Management : 8-22 mins  Donny BECKER OT Acute Rehabilitation Services Office [phone]   [name] 04/03/2024, 3:24 PM

## 2024-04-03 NOTE — ED Notes (Signed)
 EEG at bedside.

## 2024-04-03 NOTE — Progress Notes (Signed)
 EEG complete - results pending

## 2024-04-03 NOTE — ED Notes (Signed)
 Per speech they will be down in a bit to evaluate pt.

## 2024-04-03 NOTE — ED Notes (Addendum)
Pt in bed with family at bedside. 

## 2024-04-03 NOTE — ED Notes (Signed)
 Dr. Shea notified pt is unable to stay awake enough at this time to take pills. Speech should be seeing pt today.

## 2024-04-03 NOTE — ED Notes (Signed)
 Stuck pt and obtained AM labs was not able to obtain enough blood for 2nd set of cultures, lactic was within normal range and not needed at this time

## 2024-04-03 NOTE — Hospital Course (Addendum)
 [name] is a 78 y.o. 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  At baseline, able to feed by herself but when the family visited her she was very somnolent and lethargic.  EMS was called and patient in the ED patient was febrile with a temperature of 101.1 F was tachycardic at 105 and tachypneic.  Labs were notable for WBC of 9.9.  Initial sodium was 133 with potassium of 5.7.  Creatinine was elevated at 1.6.  COVID influenza and RSV was negative.  UA was negative for infection.  Lactate was 1.2.  CT head scan was negative for acute findings.  Chest x-ray showed left greater than right air space opacities.  Blood cultures were sent from the ED and patient was then considered for admission to the hospital for further evaluation and treatment.   Acute metabolic encephalopathy, secondary to sepsis. Continue fall precautions.  Continue antibiotics for   Sepsis secondary to healthcare associated pneumonia. Question aspiration.  Presented with fever with Tmax 101.1, tachycardia HR 103, tachypnea RR 24 with chest infiltrate suggestive of sepsis.  Received vancomycin and cefepime in the ED.    On normal saline.  Currently on cefepime.  Will add IV Flagyl.  Follow-up blood cultures.   Hypovolemic hyponatremia Initial serum sodium of 133.  Receiving normal saline.   Hyperalemia Initial potassium 5.7.  Received Lokelma 10 g twice daily for 2 doses.  Latest potassium was 3.9  AKI, suspect prerenal in the setting of dehydration from poor oral intake Creatinine at baseline 0.9 eGFR 60.  Presented with creatinine of 1.6.  Creatinine today after hydration at 1.3.   Elevated liver enzymes AST and T.  bili elevated.  Will continue to monitor   Erythrocytosis, unspecified Hemoglobin of 17.7 on presentation.  Hemoglobin today at 16.1.  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 Continue Keppra from home.  Seizure precaution.  IV Ativan for breakthrough seizures.  Check EEG  Poststroke dysphagia with concerns for aspiration. On dysphagia 3 diet.  Continue aspiration precautions follow speech therapy recommendations.   Generalized weakness PT OT evaluation.  Fall precautions.  Patient is from skilled nursing facility.  Might need skilled nursing facility placement on discharge

## 2024-04-03 NOTE — Progress Notes (Signed)
 PROGRESS NOTE  [name] FMW:[id number] DOB: August 09, 1945 DOA: 04/02/2024 PCP: Hillman Bare, MD   LOS: 1 day   Brief narrative:  [name] is a 78 y.o. 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  At baseline, able to feed by herself but when the family visited her she was very somnolent and lethargic.  EMS was called and patient in the ED patient was febrile with a temperature of 101.1 F was tachycardic at 105 and tachypneic.  Labs were notable for WBC of 9.9.  Initial sodium was 133 with potassium of 5.7.  Creatinine was elevated at 1.6.  COVID influenza and RSV was negative.  UA was negative for infection.  Lactate was 1.2.  CT head scan was negative for acute findings.  Chest x-ray showed left greater than right air space opacities.  Blood cultures were sent from the ED and patient was then considered for admission to the hospital for further evaluation and treatment.     Assessment/Plan: Principal Problem:   AMS (altered mental status)  Acute metabolic encephalopathy, likely secondary  to sepsis. Continue antibiotics with cefepime.  Will add Flagyl.  Follow-up blood cultures, negative in less than 12 hours..  Supportive care.  Undergoing EEG at the time of exam due to lesion in the frontal lobe.   Sepsis secondary to healthcare associated pneumonia. Question aspiration.  Presented with fever with Tmax 101.1, tachycardia HR 103, tachypnea RR 24 with chest infiltrate suggestive of sepsis secondary to pneumonia.  Received vancomycin and cefepime in the ED.    On normal saline.  Currently on cefepime.  Will add IV Flagyl.  Follow-up blood cultures, negative in less than 12  hours..  Continue supportive care.   Hypovolemic hyponatremia Initial serum sodium of 133.  Receiving normal saline.  Check BMP in a.m.   Hyperalemia Initial potassium 5.7.  Received Lokelma 10 g twice daily for 2 doses.  Latest potassium was 3.9  AKI, suspect prerenal in the setting of dehydration from poor oral intake. Creatinine at baseline 0.9 eGFR 60.  Presented with creatinine of 1.6.  Creatinine today after hydration at 1.3.   Elevated liver enzymes AST and T. bili elevated.  Will continue to monitor   Erythrocytosis, unspecified Hemoglobin of 17.7 on presentation.  Hemoglobin today at 16.1.  Will continue to monitor  History of seizure due to right frontal lesion diagnosed a few days ago. Continue Keppra from home.  Seizure precaution.  IV Ativan for breakthrough seizures.  Check EEG.  Patient was supposed to have a repeat MRI done as outpatient to better delineate that lesion  Poststroke dysphagia with concerns for aspiration. On dysphagia 3 diet.  Continue aspiration precautions, follow speech therapy recommendations.   Generalized weakness PT OT evaluation.  Fall precautions.  Patient is from skilled nursing facility.    DVT prophylaxis: enoxaparin  (LOVENOX ) injection 40 mg Start: 04/02/24 2000   Disposition: Skilled nursing facility likely in 2 to 3 days  Status is: Inpatient Remains inpatient appropriate because: Pending clinical improvement,    Code Status:     Code Status: Full Code  Family Communication: spoke with the patient's son mr Celina and updated him about the clinical condition of the patient.   Consultants: None  Procedures:  EEG Anti-infectives:  Cefepime and Flagyl IV  Anti-infectives (From admission, onward)    Start     Dose/Rate Route Frequency Ordered Stop   04/03/24 0800  ceFEPIme (MAXIPIME) 2 g in sodium chloride  0.9 % 100 mL IVPB        2 g 200 mL/hr over 30 Minutes Intravenous Every 12 hours 04/02/24 2101     04/03/24 0800   metroNIDAZOLE (FLAGYL) IVPB 500 mg        500 mg 100 mL/hr over 60 Minutes Intravenous 2 times daily 04/03/24 0747     04/02/24 1930  vancomycin (VANCOCIN) IVPB 1000 mg/200 mL premix  Status:  Discontinued        1,000 mg 200 mL/hr over 60 Minutes Intravenous  Once 04/02/24 1921 04/02/24 1922   04/02/24 1930  ceFEPIme (MAXIPIME) 2 g in sodium chloride  0.9 % 100 mL IVPB        2 g 200 mL/hr over 30 Minutes Intravenous  Once 04/02/24 1921 04/02/24 2006   04/02/24 1930  vancomycin (VANCOREADY) IVPB 2000 mg/400 mL        2,000 mg 200 mL/hr over 120 Minutes Intravenous  Once 04/02/24 1922 04/02/24 2228       Subjective: Today, patient was seen and examined at bedside.  Patient alert awake but minimally verbal, responsive at times.  Seen during EEG.  Unable to provide much information.  Spoke with the patient's son on the phone to obtain more information.  Objective: Vitals:   04/03/24 0815 04/03/24 0830  BP: (!) 143/88 129/87  Pulse: 87 87  Resp: 18 18  Temp:    SpO2: 99% 100%    Intake/Output Summary (Last 24 hours) at 04/03/2024 1017 Last data filed at 04/02/2024 2228 Gross per 24 hour  Intake 1499.91 ml  Output --  Net 1499.91 ml   There were no vitals filed for this visit. There is no height or weight on file to calculate BMI.   Physical Exam: GENERAL: Patient is alert awake and minimally interactive, obese build, not in obvious distress.  EEG electrodes in place. HENT: No scleral pallor or icterus. Pupils equally reactive to light. Oral mucosa is moist NECK: is supple, no gross swelling noted. CHEST: Diminished breath sounds bilaterally, coarse breath sounds noted, CVS: S1 and S2 heard, no murmur. Regular rate and rhythm.  ABDOMEN: Soft, non-tender, bowel sounds are present. EXTREMITIES: No edema. CNS: Squeezes hands.  Moves extremities but generalized weakness noted SKIN: warm and dry without rashes.  Data Review: I have personally reviewed the following laboratory  data and studies,  CBC: Recent Labs  Lab 04/02/24 1553 04/02/24 1623 04/03/24 0604  WBC 9.9  --  10.0  NEUTROABS 8.0*  --  7.4  HGB 16.7* 17.7* 16.1*  HCT 50.7* 52.0* 49.1*  MCV 86.7  --  88.6  PLT 263  --  221   Basic Metabolic Panel: Recent Labs  Lab 04/02/24 1553 04/02/24 1623 04/03/24 0604  NA 132* 133* 133*  K 5.1 5.7* 3.9  CL 98 100 96*  CO2 23  --  20*  GLUCOSE 112* 109* 84  BUN 20 29* 19  CREATININE 1.57* 1.60* 1.33*  CALCIUM  8.9  --  8.6*  MG  --   --  2.0  PHOS  --   --  5.7*   Liver Function Tests: Recent Labs  Lab 04/02/24 1553 04/03/24 0604  AST 50* 38  ALT 13 10  ALKPHOS 67 62  BILITOT 1.8* 1.4*  PROT 7.7 7.8  ALBUMIN  3.2* 3.1*   No results for input(s): LIPASE, AMYLASE in the last 168 hours. No results for input(s): AMMONIA in the last 168 hours. Cardiac Enzymes: No results for input(s): CKTOTAL, CKMB, CKMBINDEX, TROPONINI in the last 168 hours. BNP (last 3 results) No results for input(s): BNP in the last 8760 hours.  ProBNP (last 3 results) No results for input(s): PROBNP in the last 8760 hours.  CBG: Recent Labs  Lab 04/02/24 1641  GLUCAP 97   Recent Results (from the past 240 hours)  Resp panel by RT-PCR (RSV, Flu A&B, Covid) Anterior Nasal Swab     Status: None   Collection Time: 04/02/24  4:42 PM   Specimen: Anterior Nasal Swab  Result Value Ref Range Status   SARS Coronavirus 2 by RT PCR NEGATIVE NEGATIVE Final   Influenza A by PCR NEGATIVE NEGATIVE Final   Influenza B by PCR NEGATIVE NEGATIVE Final    Comment: (NOTE) The Xpert Xpress SARS-CoV-2/FLU/RSV plus assay is intended as an aid in the diagnosis of influenza from Nasopharyngeal swab specimens and should not be used as a sole basis for treatment. Nasal washings and aspirates are unacceptable for Xpert Xpress SARS-CoV-2/FLU/RSV testing.  Fact Sheet for Patients: bloggercourse.com  Fact Sheet for Healthcare  Providers: seriousbroker.it  This test is not yet approved or cleared by the [place]  FDA and has been authorized for detection and/or diagnosis of SARS-CoV-2 by FDA under an Emergency Use Authorization (EUA). This EUA will remain in effect (meaning this test can be used) for the duration of the COVID-19 declaration under Section 564(b)(1) of the Act, 21 U.S.C. section 360bbb-3(b)(1), unless the authorization is terminated or revoked.     Resp Syncytial Virus by PCR NEGATIVE NEGATIVE Final    Comment: (NOTE) Fact Sheet for Patients: bloggercourse.com  Fact Sheet for Healthcare Providers: seriousbroker.it  This test is not yet approved or cleared by the [place]  FDA and has been authorized for detection and/or diagnosis of SARS-CoV-2 by FDA under an Emergency Use Authorization (EUA). This EUA will remain in effect (meaning this test can be used) for the duration of the COVID-19 declaration under Section 564(b)(1) of the Act, 21 U.S.C. section 360bbb-3(b)(1), unless the authorization is terminated or revoked.  Performed at [facility], [address], [place], [place] [address]   Culture, blood (Routine X 2) w Reflex to ID Panel     Status: None (Preliminary result)   Collection Time: 04/02/24 10:54 PM   Specimen: BLOOD LEFT HAND  Result Value Ref Range Status   Specimen Description BLOOD LEFT HAND  Final   Special Requests   Final    BOTTLES DRAWN AEROBIC AND ANAEROBIC Blood Culture adequate volume   Culture   Final    NO GROWTH < 12 HOURS Performed at [facility], [address], [place], [place] [address]    Report Status PENDING  Incomplete     Studies: DG Chest Portable 1 View Result Date: 04/02/2024 EXAM: 1 AP VIEW(S) XRAY OF THE CHEST 04/02/2024 04:10:00 PM COMPARISON: 02/25/2019 CLINICAL HISTORY: ?pna pna FINDINGS: LUNGS AND PLEURA: Lower lung predominant mild  interstitial thickening is nonspecific. Left greater than right basilar airspace disease. No pleural effusion. No pneumothorax. HEART AND MEDIASTINUM: Normal heart size for AP portable technique. Patient rotated minimally right. BONES AND SOFT TISSUES: No acute osseous abnormality. Exam limited by AP portable technique and patient body habitus. IMPRESSION: 1. Limited AP portable radiographs due to patient body habitus 2. Left greater than  right base airspace disease. This could represent atelectasis. especially at the left lung base, infection or aspiration cannot be excluded. Consider PA and lateral radiographs if possible, Electronically signed by: Rockey Kilts MD 04/02/2024 04:42 PM EST RP Workstation: [id number]   CT HEAD WO CONTRAST Result Date: 04/02/2024 EXAM: CT HEAD WITHOUT CONTRAST 04/02/2024 03:56:34 PM TECHNIQUE: CT of the head was performed without the administration of intravenous contrast. Automated exposure control, iterative reconstruction, and/or weight based adjustment of the mA/kV was utilized to reduce the radiation dose to as low as reasonably achievable. COMPARISON: Head CT and MRI 03/23/2024. CLINICAL HISTORY: Neuro deficit, acute, stroke suspected. Altered mental status. History of seizures. FINDINGS: BRAIN AND VENTRICLES: There is persistent hypodensity/mild edema in the right frontoparietal operculum corresponding to the abnormal T2 hyperintensity and enhancement on the recent prior MRI. Small mildly hyperdense hemorrhage in this region on the prior CT is no longer clearly evident. No new intracranial hemorrhage, acute large territory infarct, midline shift, hydrocephalus, or extra-axial fluid collection is identified. Confluent hypodensities elsewhere in the cerebral white matter bilaterally are similar to the prior CT and nonspecific but compatible with extensive chronic small vessel ischemic disease. There is heterogeneous hypodensity in the deep gray nuclei bilaterally, including in  the right thalamus where enhancement was shown on the recent MRI. There is mild cerebral atrophy. Calcified atherosclerosis at the skull base. ORBITS: Bilateral cataract extraction. SINUSES: Small right mastoid effusion. The included paranasal sinuses are clear. SOFT TISSUES AND SKULL: No acute soft tissue abnormality. No skull fracture. IMPRESSION: 1. Persistent hypodensity/mild edema in the right frontoparietal operculum corresponding to signal abnormality on the recent MRI where concern was raised for possible infiltrative primary tumor. 2. No evidence of a new intracranial abnormality 3. Extensive chronic small vessel ischemic disease. Electronically signed by: Dasie Hamburg MD 04/02/2024 04:32 PM EST RP Workstation: [id number]      Vernal Alstrom, MD  [facility] 04/03/2024  If 7PM-7AM, please contact night-coverage

## 2024-04-04 DIAGNOSIS — Z7189 Other specified counseling: Secondary | ICD-10-CM | POA: Diagnosis not present

## 2024-04-04 DIAGNOSIS — Z515 Encounter for palliative care: Secondary | ICD-10-CM | POA: Diagnosis not present

## 2024-04-04 DIAGNOSIS — I10 Essential (primary) hypertension: Secondary | ICD-10-CM | POA: Diagnosis not present

## 2024-04-04 DIAGNOSIS — Z8673 Personal history of transient ischemic attack (TIA), and cerebral infarction without residual deficits: Secondary | ICD-10-CM | POA: Diagnosis not present

## 2024-04-04 DIAGNOSIS — J189 Pneumonia, unspecified organism: Secondary | ICD-10-CM | POA: Diagnosis not present

## 2024-04-04 DIAGNOSIS — R4182 Altered mental status, unspecified: Secondary | ICD-10-CM | POA: Diagnosis not present

## 2024-04-04 DIAGNOSIS — F0390 Unspecified dementia without behavioral disturbance: Secondary | ICD-10-CM | POA: Diagnosis not present

## 2024-04-04 DIAGNOSIS — G939 Disorder of brain, unspecified: Secondary | ICD-10-CM | POA: Diagnosis not present

## 2024-04-04 LAB — CBC
HCT: 47.4 % — ABNORMAL HIGH (ref 36.0–46.0)
Hemoglobin: 15.3 g/dL — ABNORMAL HIGH (ref 12.0–15.0)
MCH: 28.7 pg (ref 26.0–34.0)
MCHC: 32.3 g/dL (ref 30.0–36.0)
MCV: 88.8 fL (ref 80.0–100.0)
Platelets: 237 K/uL (ref 150–400)
RBC: 5.34 MIL/uL — ABNORMAL HIGH (ref 3.87–5.11)
RDW: 14.6 % (ref 11.5–15.5)
WBC: 9 K/uL (ref 4.0–10.5)
nRBC: 0 % (ref 0.0–0.2)

## 2024-04-04 LAB — COMPREHENSIVE METABOLIC PANEL WITH GFR
ALT: 10 U/L (ref 0–44)
AST: 34 U/L (ref 15–41)
Albumin: 2.7 g/dL — ABNORMAL LOW (ref 3.5–5.0)
Alkaline Phosphatase: 56 U/L (ref 38–126)
Anion gap: 18 — ABNORMAL HIGH (ref 5–15)
BUN: 18 mg/dL (ref 8–23)
CO2: 15 mmol/L — ABNORMAL LOW (ref 22–32)
Calcium: 8.4 mg/dL — ABNORMAL LOW (ref 8.9–10.3)
Chloride: 101 mmol/L (ref 98–111)
Creatinine, Ser: 1.33 mg/dL — ABNORMAL HIGH (ref 0.44–1.00)
GFR, Estimated: 40 mL/min — ABNORMAL LOW (ref >60)
Glucose, Bld: 73 mg/dL (ref 70–99)
Potassium: 4.2 mmol/L (ref 3.5–5.1)
Sodium: 134 mmol/L — ABNORMAL LOW (ref 135–145)
Total Bilirubin: 1.2 mg/dL (ref 0.0–1.2)
Total Protein: 7 g/dL (ref 6.5–8.1)

## 2024-04-04 LAB — LEVETIRACETAM LEVEL: Levetiracetam Lvl: 42.9 ug/mL — ABNORMAL HIGH (ref 10.0–40.0)

## 2024-04-04 LAB — MAGNESIUM: Magnesium: 2 mg/dL (ref 1.7–2.4)

## 2024-04-04 LAB — PHOSPHORUS: Phosphorus: 4.7 mg/dL — ABNORMAL HIGH (ref 2.5–4.6)

## 2024-04-04 MED ORDER — SODIUM CHLORIDE 0.9 % IV SOLN
3.0000 g | Freq: Four times a day (QID) | INTRAVENOUS | Status: DC
  Administered 2024-04-04 – 2024-04-07 (×13): 3 g via INTRAVENOUS
  Filled 2024-04-04 (×13): qty 8

## 2024-04-04 MED ORDER — PANTOPRAZOLE SODIUM 40 MG PO TBEC
40.0000 mg | DELAYED_RELEASE_TABLET | Freq: Every day | ORAL | Status: DC
  Administered 2024-04-04 – 2024-04-09 (×6): 40 mg via ORAL
  Filled 2024-04-04 (×6): qty 1

## 2024-04-04 MED ORDER — PREDNISOLONE ACETATE 1 % OP SUSP
1.0000 [drp] | Freq: Four times a day (QID) | OPHTHALMIC | Status: DC
  Administered 2024-04-04 – 2024-04-13 (×38): 1 [drp] via OPHTHALMIC
  Filled 2024-04-04 (×2): qty 5

## 2024-04-04 MED ORDER — DONEPEZIL HCL 10 MG PO TABS
10.0000 mg | ORAL_TABLET | Freq: Every day | ORAL | Status: DC
  Administered 2024-04-04 – 2024-04-09 (×6): 10 mg via ORAL
  Filled 2024-04-04 (×6): qty 1

## 2024-04-04 NOTE — NC FL2 (Signed)
 [place]  MEDICAID [id number] LEVEL OF CARE FORM     IDENTIFICATION  Patient Name: [name] Birthdate: 02-06-1946 Sex: female Admission Date (Current Location): 04/02/2024  [facility] and [place] Number:  Producer, Television/film/video and Address:  [facility], [address] [place], [place] [address]      Provider Number: [id number]  Attending Physician Name and Address:  Raenelle Donalda HERO, MD  Relative Name and Phone Number:  [name] (son) [phone]    Current Level of Care: Hospital Recommended Level of Care: Skilled Nursing Facility Prior Approval Number:    Date Approved/Denied:   PASRR Number: [id number]  Discharge Plan: SNF    Current Diagnoses: Patient Active Problem List   Diagnosis Date Noted   AMS (altered mental status) 04/02/2024   Brain mass 03/27/2024   Pneumonia due to COVID-19 virus 02/26/2019   Respiratory failure (HCC)/ hypoxic 02/26/2019   Dyslipidemia 02/26/2019   Bunion 08/21/2014   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   Onychomycosis 02/28/2013   Pain, foot 02/28/2013   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   Essential hypertension 05/31/2012   GERD (gastroesophageal reflux disease) 05/31/2012   Osteoarthritis of knee, unspecified 04/25/2012    Orientation RESPIRATION BLADDER Height & Weight     Self, Time  Normal Incontinent, External catheter Weight:   Height:     BEHAVIORAL SYMPTOMS/MOOD NEUROLOGICAL BOWEL NUTRITION STATUS      Incontinent Diet (See d/c summary)  AMBULATORY STATUS COMMUNICATION OF NEEDS Skin   Extensive Assist Verbally (Difficulty speaking) Other (Comment) (Erythema,arm,leg,Buttocks,Bil.,Wound/Incision LDAs,Wound Infiltration/Extra vasation,arm,anterior,L,Lower)                       Personal Care Assistance Level of Assistance  Bathing, Feeding, Dressing Bathing Assistance: Maximum  assistance Feeding assistance: Maximum assistance Dressing Assistance: Maximum assistance     Functional Limitations Info  Sight, Hearing Sight Info: Adequate Hearing Info: Adequate Speech Info:  (Difficulty speaking)    SPECIAL CARE FACTORS FREQUENCY  PT (By licensed PT), OT (By licensed OT)     PT Frequency: 5x/week OT Frequency: 5x/week            Contractures Contractures Info: Not present    Additional Factors Info  Allergies, Code Status Code Status Info: Full Allergies Info: Shellfish Protein-containing Drug Products,Carisoprodol-aspirin -codeine,Codeine,Doxycycline,Tetracycline Psychotropic Info: donepezil (ARICEPT) tablet 10 mg daily,levETIRAcetam (KEPPRA) undiluted injection 500 mg every 12 hours         Current Medications (04/04/2024):  This is the current hospital active medication list Current Facility-Administered Medications  Medication Dose Route Frequency Provider Last Rate Last Admin   acetaminophen  (TYLENOL ) tablet 500 mg  500 mg Oral Q6H PRN Hall, Carole N, DO   500 mg at 04/03/24 2027   amLODipine (NORVASC) tablet 10 mg  10 mg Oral Daily Shona Laurence N, DO   10 mg at 04/04/24 9076   Ampicillin-Sulbactam (UNASYN) 3 g in sodium chloride  0.9 % 100 mL IVPB  3 g Intravenous Q6H Reome, Earle J, RPH 200 mL/hr at 04/04/24 0931 3 g at 04/04/24 0931   donepezil (ARICEPT) tablet 10 mg  10 mg Oral Daily Ghimire, Shanker M, MD   10 mg at 04/04/24 9076   enoxaparin  (LOVENOX ) injection 40 mg  40 mg Subcutaneous Q24H Shona Laurence N, DO   40 mg at 04/03/24 2028   levETIRAcetam (KEPPRA) tablet 500 mg  500 mg Oral BID Shona Laurence SAILOR,  DO   500 mg at 04/04/24 9076   LORazepam  (ATIVAN ) injection 2 mg  2 mg Intravenous Q6H PRN Shona Laurence N, DO       melatonin tablet 5 mg  5 mg Oral QHS PRN Shona Laurence N, DO       pantoprazole  (PROTONIX ) EC tablet 40 mg  40 mg Oral Daily Ghimire, Shanker M, MD   40 mg at 04/04/24 9076   polyethylene glycol (MIRALAX  / GLYCOLAX ) packet 17 g   17 g Oral Daily PRN Hall, Carole N, DO       prednisoLONE  acetate (PRED FORTE ) 1 % ophthalmic suspension 1 drop  1 drop Right Eye QID Ghimire, Donalda HERO, MD       prochlorperazine  (COMPAZINE ) injection 5 mg  5 mg Intravenous Q6H PRN Shona Laurence N, DO       rosuvastatin  (CRESTOR ) tablet 20 mg  20 mg Oral Daily Shona Laurence N, DO   20 mg at 04/04/24 9076     Discharge Medications: Please see discharge summary for a list of discharge medications.  Relevant Imaging Results:  Relevant Lab Results:   Additional Information SSN# [id number]  Bernardino Dean, LCSWA

## 2024-04-04 NOTE — Consult Note (Signed)
 Consultation Note Date: 04/04/2024   Patient Name: [name]  DOB: 1945/08/05  MRN: [id number]  Age / Sex: 78 y.o., female  PCP: Hillman Bare, MD Referring Physician: Raenelle Donalda HERO, MD  Reason for Consultation: Establishing goals of care  HPI/Patient Profile: 78 y.o. 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 Neurosurgery reports that tumor is not appropriate for surgical resection and recommend repeat MRI in 6-8 weeks and follow up with neuro-oncology. Admitted on 04/02/2024 with altered mental status with pneumonia.   Clinical Assessment and Goals of Care: Consult received and thorough chart review completed. I discussed with Dr. Raenelle. I met with [name]. She is pleasantly confused. Unable to answer my questions. Does not follow commands. She does smile and appear to be in good spirits. I discussed with RN who denies any concerns but very poor intake.  I called and left voicemail requesting return call with son [name]. I was able to reach son [name] who is en-route to the hospital soon and we will meet and talk in person.   I met today with [name] at bedside. [name] slept as we spoke. He shares that his mother was doing better after being discharged initially but he began to notice she was a little more confused and then a few days later she was unresponsive and readmitted to the hospital. He shares that she had dementia and was living at home but his brother moved in with her ~1 year ago knowing that her dementia was progressing and she was becoming less safe to live alone. She  was a education officer, environmental. Her husband (their father) died at 48 years old. He reports that his mother has always been very calm and all about her family and church.   We spent time discussing her brain lesion and the very high suspicion this is cancer. Family are interested in more definitive answers about lesion and we did discuss that biopsy was deemed to be very invasive and too risky due to location of lesion. They would be interested in hearing from oncology to get further perspective. This would help aide them in their decision making.   We spent time discussing her critical illness and poor intake. We discussed concern that if she continues down this path we could be reaching end of life in the near future. We discussed decisions ahead including code status, artificial feeding, and hospice. He shares that they want to continue antibiotics and have more time and information. They do not want her to suffer. They are hoping she will improve but are also able to acknowledge that this may not be her journey.   [name] would like to meet together with his brother and we agree to meet tomorrow 1000 am. Hard Choices booklet provided.   All questions/concerns  addressed. Emotional support provided. Updated Dr. Raenelle.   Primary Decision Maker NEXT OF KIN 2 sons    SUMMARY OF RECOMMENDATIONS   - Family meeting tomorrow 11/20 1000 am - Recommend oncology input to help family with decision making  Code Status/Advance Care Planning: Full code - under discussion   Symptom Management:  Per attending.  Recommend oncology input.   Prognosis:  Prognosis poor.   Discharge Planning: To Be Determined      Primary Diagnoses: Present on Admission:  AMS (altered mental status)   I have reviewed the medical record, interviewed the patient and family, and examined the patient. The following aspects are pertinent.  Past Medical History:  Diagnosis Date   Hypertension    Social History   Socioeconomic  History   Marital status: Widowed    Spouse name: Not on file   Number of children: Not on file   Years of education: Not on file   Highest education level: Not on file  Occupational History   Not on file  Tobacco Use   Smoking status: Never   Smokeless tobacco: Never  Vaping Use   Vaping status: Never Used  Substance and Sexual Activity   Alcohol use: No    Alcohol/week: 0.0 standard drinks of alcohol   Drug use: No   Sexual activity: Not Currently    Birth control/protection: Surgical    Comment: hysterectomy  Other Topics Concern   Not on file  Social History Narrative   Not on file   Social Drivers of Health   Financial Resource Strain: Not on file  Food Insecurity: No Food Insecurity (03/23/2024)   Hunger Vital Sign    Worried About Running Out of Food in the Last Year: Never true    Ran Out of Food in the Last Year: Never true  Transportation Needs: No Transportation Needs (03/23/2024)   PRAPARE - Administrator, Civil Service (Medical): No    Lack of Transportation (Non-Medical): No  Physical Activity: Not on file  Stress: Not on file  Social Connections: Moderately Integrated (03/23/2024)   Social Connection and Isolation Panel    Frequency of Communication with Friends and Family: Three times a week    Frequency of Social Gatherings with Friends and Family: Three times a week    Attends Religious Services: More than 4 times per year    Active Member of Clubs or Organizations: Yes    Attends Banker Meetings: More than 4 times per year    Marital Status: Widowed   Family History  Problem Relation Age of Onset   Stroke Mother    Stroke Father    Stroke Sister    Hypertension Brother    Scheduled Meds:  amLODipine  10 mg Oral Daily   donepezil  10 mg Oral Daily   enoxaparin  (LOVENOX ) injection  40 mg Subcutaneous Q24H   levETIRAcetam  500 mg Oral BID   pantoprazole   40 mg Oral Daily   prednisoLONE acetate  1 drop Right Eye QID    rosuvastatin   20 mg Oral Daily   Continuous Infusions:  ampicillin-sulbactam (UNASYN) IV 3 g (04/04/24 0931)   PRN Meds:.acetaminophen , LORazepam, melatonin, polyethylene glycol, prochlorperazine Allergies  Allergen Reactions   Shellfish Protein-Containing Drug Products Other (See Comments)    Shrimp and lobster--severe swelling/itching   Carisoprodol-Aspirin -Codeine Other (See Comments)    Unknown    Codeine Nausea And Vomiting   Doxycycline Nausea Only   Soma [Carisoprodol] Other (See Comments)  Unknown and not listed on MAR.   Tetracycline Nausea Only   Review of Systems  Unable to perform ROS: Dementia    Physical Exam Vitals and nursing note reviewed.  Constitutional:      General: She is sleeping. She is not in acute distress.    Appearance: She is ill-appearing.     Comments: Smiles when awakens to voice  Cardiovascular:     Rate and Rhythm: Normal rate.  Pulmonary:     Effort: No tachypnea, accessory muscle usage or retractions.     Comments: Room air Poor management of oral secretions Abdominal:     Palpations: Abdomen is soft.  Neurological:     Mental Status: She is easily aroused. She is confused.     Comments: Does not follow commands     Vital Signs: BP (!) 156/96   Pulse 95   Temp 98.3 F (36.8 C) (Oral)   Resp 20   SpO2 94%  Pain Scale: Faces   Pain Score: 0-No pain   SpO2: SpO2: 94 % O2 Device:SpO2: 94 % O2 Flow Rate: .O2 Flow Rate (L/min): 2 L/min  IO: Intake/output summary:  Intake/Output Summary (Last 24 hours) at 04/04/2024 1048 Last data filed at 04/04/2024 0351 Gross per 24 hour  Intake 295.73 ml  Output 1100 ml  Net -804.27 ml    LBM: Last BM Date : 04/03/24 Baseline Weight:   Most recent weight:         Time Total: 75 min  Greater than 50%  of this time was spent counseling and coordinating care related to the above assessment and plan.  Signed by: [name], NP Palliative Medicine Team Pager # [phone]  (M-F 8a-5p) Team Phone # [phone] (Nights/Weekends)

## 2024-04-04 NOTE — TOC Initial Note (Addendum)
 Transition of Care [facility]) - Initial/Assessment Note    Patient Details  Name: [name] MRN: [id number] Date of Birth: 1945-12-23  Transition of Care [facility]) CM/SW Contact:    Bernardino Dean, LCSWA Phone Number: 04/04/2024, 9:50 AM  Clinical Narrative:                 Patient presents to hospital following recent discharge to SNF [facility]) for AMS/encephalopathy w/ PNA. Hx of dementia with recent seizures, hemiparesis, and brain lesion. TOC consult for SNF discharge received. Chart reviewed, FL2 completed. Following for likely SNF discharge.    1351 - Met with patient and son, [name] [phone]), at bedside. Discussed disposition planning. [name] agreeable for eventual return to [facility]. Referral sent. TOC following.   Expected Discharge Plan: Skilled Nursing Facility Barriers to Discharge: Continued Medical Work up, English As A Second Language Teacher, No SNF bed   Patient Goals and CMS Choice Patient states their goals for this hospitalization and ongoing recovery are:: Return to SNF CMS Medicare.gov Compare Post Acute Care list provided to:: Other (Comment Required) Choice offered to / list presented to : Adult Children [facility] [phone])) [place] ownership interest in [facility].provided to:: Adult Children    Expected Discharge Plan and Services In-house Referral: Clinical Social Work   Post Acute Care Choice: Skilled Nursing Facility Living arrangements for the past 2 months: Skilled Nursing Facility, Apartment                                      Prior Living Arrangements/Services Living arrangements for the past 2 months: Skilled Nursing Facility, Apartment Lives with:: Siblings Patient language and need for interpreter reviewed:: Yes        Need for Family Participation in Patient Care: Yes (Comment) Care giver support system in place?: Yes (comment)   Criminal Activity/Legal Involvement Pertinent to Current Situation/Hospitalization: No -  Comment as needed  Activities of Daily Living      Permission Sought/Granted                  Emotional Assessment         Alcohol / Substance Use: Not Applicable Psych Involvement: No (comment)  Admission diagnosis:  Healthcare-associated pneumonia [J18.9] History of stroke [Z86.73] Altered mental status, unspecified altered mental status type [R41.82] AMS (altered mental status) [R41.82] Patient Active Problem List   Diagnosis Date Noted   AMS (altered mental status) 04/02/2024   Brain mass 03/27/2024   Pneumonia due to COVID-19 virus 02/26/2019   Respiratory failure (HCC)/ hypoxic 02/26/2019   Dyslipidemia 02/26/2019   Bunion 08/21/2014   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   Onychomycosis 02/28/2013   Pain, foot 02/28/2013   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   Essential hypertension 05/31/2012   GERD (gastroesophageal reflux disease) 05/31/2012   Osteoarthritis of knee, unspecified 04/25/2012   PCP:  Hillman Bare, MD Pharmacy:   [facility] [phone] [place], [place] - [address] AT [address] [place] [address] Phone: [phone] Fax: [phone]  [facility] - [place], [place] - [address] [place] [address] Phone: [phone] Fax: [phone]     Social Drivers of Health (SDOH) Social History: SDOH Screenings   Food Insecurity: No Food Insecurity (03/23/2024)  Housing: Low Risk  (03/23/2024)  Transportation Needs: No Transportation Needs (03/23/2024)  Utilities: Not At Risk (03/23/2024)  Social Connections: Moderately Integrated (03/23/2024)  Tobacco Use: Low Risk  (08/11/2023)   SDOH Interventions:     Readmission Risk Interventions     No data to display

## 2024-04-04 NOTE — Evaluation (Signed)
 Physical Therapy Evaluation Patient Details Name: [name] MRN: [id number] DOB: Nov 01, 1945 Today's Date: 04/04/2024  History of Present Illness  78 y.o. female presents to [facility] 04/02/24 with AMS and somnolence. Found to have sepsis due to PNA. 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 Recently d/c from hospital to SNF on 11/11. 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   Clinical Impression  PTA pt was mainly sedentary and would walk short distances with a RW. Pt with recent hospital admit and d/c to SNF where pt was needing increased assistance for mobility. Limited PT eval as pt was obtunded and not following commands. No movement observed in LE's or UE's with pt grimacing with PROM. Noted more discomfort when attempting to perform PROM to L LE when compared to R LE. Son present during eval with education provided on importance of PROM, changing positions every two hours and checking for pressure wounds. [name] son reported plan for palliative care discussion tomorrow. Discussed if pt was to return home, pt would need a hoyer lift and hospital bed with an air mattress. Current plan for <3hrs post acute rehab with acute PT to follow as appropriate.       If plan is discharge home, recommend the following: Two people to help with walking and/or transfers;Two people to help with bathing/dressing/bathroom;Direct supervision/assist for medications management;Direct supervision/assist for financial management;Assist for transportation;Help with stairs or ramp for entrance;Supervision due to cognitive status   Can travel by private vehicle   No    Equipment Recommendations Wheelchair (measurements PT);Wheelchair cushion (measurements  PT);Hospital bed;Hoyer lift     Functional Status Assessment Patient has had a recent decline in their functional status and/or demonstrates limited ability to make significant improvements in function in a reasonable and predictable amount of time     Precautions / Restrictions Precautions Precautions: Fall Recall of Precautions/Restrictions: Impaired Restrictions Weight Bearing Restrictions Per Provider Order: No      Mobility  Bed Mobility Overal bed mobility: Needs Assistance Bed Mobility: Rolling Rolling: Total assist   General bed mobility comments: TotalA to re-adjust in supine with no initiation. Deferred moving to EOB due to decreased level of alertness and per family request     Balance Overall balance assessment: Needs assistance         Pertinent Vitals/Pain Pain Assessment Breathing: normal Negative Vocalization: occasional moan/groan, low speech, negative/disapproving quality Facial Expression: smiling or inexpressive Body Language: relaxed Consolability: no need to console PAINAD Score: 1 Pain Intervention(s): Limited activity within patient's tolerance, Monitored during session, Repositioned    Home Living Family/patient expects to be discharged to:: Skilled nursing facility (MD is putting in pallative consult) Living Arrangements: Children Available Help at Discharge: Family;Available PRN/intermittently Type of Home: Apartment Home Access: Level entry    Home Layout: One level Home Equipment: Agricultural Consultant (2 wheels);Rollator (4 wheels)      Prior Function Prior Level of Function : Needs assist;History of Falls (last six months)    Mobility Comments: very sedentary at baseline; walks with RW, however parks it and goes without it at times; recent falls with each time she did not have the RW with her (from last admission a week ago) ADLs Comments: modified independent basic ADLs; sponge bathes; memory deficits (from last admission a week ago)  Extremity/Trunk Assessment   Upper Extremity Assessment Upper Extremity Assessment: Defer to OT evaluation    Lower Extremity Assessment Lower Extremity Assessment: Difficult to assess due to impaired cognition (pain with L>R ankle PROM and hip/knee PROM. Unable to fully assess due to cognition and pain)    Cervical / Trunk Assessment Cervical / Trunk Assessment: Other exceptions Cervical / Trunk Exceptions: increased body habitus  Communication   Communication Communication: Impaired Factors Affecting Communication: Reduced clarity of speech;Difficulty expressing self (no attempts to vocalize)    Cognition Arousal: Stuporous Behavior During Therapy: Flat affect   PT - Cognitive impairments: History of cognitive impairments, Difficult to assess Difficult to assess due to: Level of arousal   PT - Cognition Comments: Able to slightly open eyes, however, would not make eye contact or keep eyes open. Unable to follow other commands for movement or sequence washing face with a wash cloth Following commands: Impaired Following commands impaired:  (not following commands)     Cueing Cueing Techniques: Verbal cues, Tactile cues     General Comments General comments (skin integrity, edema, etc.): Son present and supportive during session.     PT Assessment Patient needs continued PT services  PT Problem List Decreased strength;Decreased activity tolerance;Decreased balance;Decreased mobility;Decreased cognition;Decreased knowledge of use of DME;Obesity;Impaired tone;Decreased range of motion;Decreased safety awareness       PT Treatment Interventions DME instruction;Gait training;Functional mobility training;Therapeutic activities;Therapeutic exercise;Balance training;Neuromuscular re-education;Patient/family education;Wheelchair mobility training    PT Goals (Current goals can be found in the Care Plan section)  Acute Rehab PT Goals Patient Stated Goal: unable to state PT  Goal Formulation: With family Time For Goal Achievement: 04/18/24 Potential to Achieve Goals: Fair    Frequency Min 2X/week        AM-PAC PT 6 Clicks Mobility  Outcome Measure Help needed turning from your back to your side while in a flat bed without using bedrails?: Total Help needed moving from lying on your back to sitting on the side of a flat bed without using bedrails?: Total Help needed moving to and from a bed to a chair (including a wheelchair)?: Total Help needed standing up from a chair using your arms (e.g., wheelchair or bedside chair)?: Total Help needed to walk in hospital room?: Total Help needed climbing 3-5 steps with a railing? : Total 6 Click Score: 6    End of Session   Activity Tolerance: Patient limited by pain;Patient limited by lethargy Patient left: in bed;with call bell/phone within reach;with bed alarm set;with family/visitor present Nurse Communication: Mobility status PT Visit Diagnosis: Repeated falls (R29.6);Muscle weakness (generalized) (M62.81);Difficulty in walking, not elsewhere classified (R26.2);Hemiplegia and hemiparesis Hemiplegia - Right/Left: Left Hemiplegia - dominant/non-dominant: Non-dominant Hemiplegia - caused by: Nontraumatic intracerebral hemorrhage    Time: 8379-8362 PT Time Calculation (min) (ACUTE ONLY): 17 min   Charges:   PT Evaluation $PT Eval Moderate Complexity: 1 Mod   PT General Charges $$ ACUTE PT VISIT: 1 Visit       Kate ORN, PT, DPT Secure Chat Preferred  Rehab Office [phone]   Kate BRAVO Wendolyn 04/04/2024, 4:59 PM

## 2024-04-04 NOTE — Progress Notes (Signed)
 Speech Language Pathology Treatment: Dysphagia  Patient Details Name: [name] MRN: [id number] DOB: 1945/11/23 Today's Date: 04/04/2024 Time: 8761-8753 SLP Time Calculation (min) (ACUTE ONLY): 8 min  Assessment / Plan / Recommendation Clinical Impression  Pt appears more lethargic today and only roused for brief periods of time despite Max cueing. She opened her mouth to accept ice chips but did not initiate oral transit and they were removed from her oral cavity. RN reports pt was more alert this AM, taking pills with puree and having sips of thin liquids without coughing. Will leave current diet in place, only to be offered with full supervision when pt is fully alert. SLP will f/u to assess readiness to advance diet vs need for instrumental testing.    HPI HPI: [name] is an 78 yo female who presented to ED from SNF with somnolence 11/17. Dx acute metabolic encephalopathy, sepsis secondary to HAP. Recent admission 11/ 7 -11/ 11 for seizures, cognitive changes, dysphagia and dysarthria. Dx R frontal infiltrative high-grade lesion, suspicious for tumor - nsgy recommended repeat MRI x six weeks. MBS 03/26/24 moderate pharyngeal  dysphagia with penetration thin liquids to the vocal folds; left oral residue.  Dysphagia 3/thin liquids recommended. PMHx HTN, osteoarthritis, and memory loss weakness.      SLP Plan  Continue with current plan of care          Recommendations  Diet recommendations: Other(comment) (full liquid diet) Liquids provided via: Cup;Straw Medication Administration: Whole meds with puree Supervision: Full supervision/cueing for compensatory strategies;Staff to assist with self feeding Compensations: Minimize environmental distractions;Slow rate;Small sips/bites Postural Changes and/or Swallow Maneuvers: Seated upright 90 degrees                  Oral care BID   Frequent or constant Supervision/Assistance Dysphagia, oropharyngeal phase (R13.12)      Continue with current plan of care     Damien Blumenthal, M.A., CCC-SLP Speech Language Pathology, Acute Rehabilitation Services  Secure Chat preferred [phone]   04/04/2024, 1:36 PM

## 2024-04-04 NOTE — Care Management Obs Status (Signed)
 MEDICARE OBSERVATION STATUS NOTIFICATION   Patient Details  Name: [name] MRN: [id number] Date of Birth: 03/22/1946   Medicare Observation Status Notification Given:       [name] 04/04/2024, 4:24 PM

## 2024-04-04 NOTE — Progress Notes (Addendum)
 PROGRESS NOTE        PATIENT DETAILS Name: [name] Age: 78 y.o. Sex: female Date of Birth: June 27, 1945 Admit Date: 04/02/2024 Admitting Physician Terry LOISE Hurst, DO ERE:Xpoejumprx, Zachary, MD  Brief Summary: Patient is a 78 y.o.  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  She was brought to the ED on 11/17-for altered mental status-was found to have acute metabolic encephalopathy in the setting of PNA.  Significant events: 11/17>> admit to [facility].  Significant studies: 11/17>> CT head: Persistent hypodensity/mild edema right frontoparietal operculum 11/17>> CXR: Left> right airspace disease-atelectasis versus infection versus aspiration. 11/18>> EEG: No seizures.  Significant microbiology data: 11/17>> COVID/influenza/RSV PCR: Negative 11/17>> blood culture: No growth 11/18>> blood culture: No growth  Procedures: None  Consults: None  Subjective: Lying comfortably in bed-able to answer simple questions.  Follows simple commands.  Objective: Vitals: Blood pressure (!) 142/86, pulse 95, temperature 99.6 F (37.6 C), temperature source Axillary, resp. rate 16, SpO2 93%.   Exam: Gen Exam:Alert awake-not in any distress HEENT:atraumatic, normocephalic Chest: B/L clear to auscultation anteriorly CVS:S1S2 regular Abdomen:soft non tender, non distended Extremities:no edema Neurology: Left hemiparesis-LLE>> LUE Skin: no rash  Pertinent Labs/Radiology:    Latest Ref Rng & Units 04/04/2024    3:41 AM 04/03/2024    6:04 AM 04/02/2024    4:23 PM  CBC  WBC 4.0 - 10.5 K/uL 9.0  10.0     Hemoglobin 12.0 - 15.0 g/dL 84.6  83.8  82.2   Hematocrit 36.0 - 46.0 % 47.4  49.1  52.0   Platelets 150 - 400 K/uL 237  221      Lab Results  Component Value Date   NA 134 (L) 04/04/2024   K 4.2 04/04/2024   CL 101 04/04/2024   CO2 15 (L) 04/04/2024      Assessment/Plan: Acute metabolic encephalopathy Secondary to sepsis physiology/aspiration pneumonia CT head with unchanged right frontal lobe lesion EEG negative for seizures Clinically improved-answering simple questions/following commands appropriately Continue to treat underlying sepsis physiology/pneumonia with antibiotics.  Sepsis secondary to presumed aspiration pneumonia (likely related to recent dysphagia in the setting of CVA/right frontal lobe lesion) Sepsis physiology has resolved Suspect we can narrow antibiotics to Unasyn   Oropharyngeal dysphagia Likely related to recent CVA versus right frontal lobe tumor On full liquids-SLP following-will await further recommendations.  AKI Mild Likely hemodynamically mediated Follow electrolytes periodically Avoid nephrotoxic agents.  Hyponatremia Mild Likely of no clinical significance Follow electrolytes periodically  Seizure Likely related to underlying right frontal lobe lesion Continue Keppra  Encephalopathy/confusion felt to be related to PNA/sepsis physiology rather than breakthrough seizures Spot EEG negative.  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 Continues to have significant left-sided hemiparesis (leg> arm)  HTN BP stable Amlodipine   HLD Statin  Polycythemia Mild Unclear etiology Stable for periodic follow-up.  GERD PPI  Dementia/cognitive dysfunction Resume Aricept   Class 2 Obesity: Estimated body mass index is 35.34 kg/m as  calculated from the following:   Height as of 02/26/24: 5' 4 (1.626 m).  Weight as of 03/23/24: 93.4 kg.   Code status:   Code Status: Full Code   DVT Prophylaxis: enoxaparin  (LOVENOX ) injection 40 mg Start: 04/02/24 2000   Family Communication: Son-[name] Vawter-[phone] left VM-11/19   Disposition Plan: Status is: Inpatient Remains inpatient appropriate because: Severity of illness   Planned Discharge Destination:Skilled nursing facility   Diet: Diet Order             Diet full liquid Room service appropriate? Yes with Assist; Fluid consistency: Thin  Diet effective now                     Antimicrobial agents: Anti-infectives (From admission, onward)    Start     Dose/Rate Route Frequency Ordered Stop   04/03/24 0800  ceFEPIme (MAXIPIME) 2 g in sodium chloride  0.9 % 100 mL IVPB        2 g 200 mL/hr over 30 Minutes Intravenous Every 12 hours 04/02/24 2101     04/03/24 0800  metroNIDAZOLE (FLAGYL) IVPB 500 mg        500 mg 100 mL/hr over 60 Minutes Intravenous 2 times daily 04/03/24 0747     04/02/24 1930  vancomycin (VANCOCIN) IVPB 1000 mg/200 mL premix  Status:  Discontinued        1,000 mg 200 mL/hr over 60 Minutes Intravenous  Once 04/02/24 1921 04/02/24 1922   04/02/24 1930  ceFEPIme (MAXIPIME) 2 g in sodium chloride  0.9 % 100 mL IVPB        2 g 200 mL/hr over 30 Minutes Intravenous  Once 04/02/24 1921 04/02/24 2006   04/02/24 1930  vancomycin (VANCOREADY) IVPB 2000 mg/400 mL        2,000 mg 200 mL/hr over 120 Minutes Intravenous  Once 04/02/24 1922 04/02/24 2228        MEDICATIONS: Scheduled Meds:  amLODipine  10 mg Oral Daily   enoxaparin  (LOVENOX ) injection  40 mg Subcutaneous Q24H   levETIRAcetam  500 mg Oral BID   rosuvastatin   20 mg Oral Daily   Continuous Infusions:  ceFEPime (MAXIPIME) IV 2 g (04/03/24 2025)   metronidazole 500 mg (04/03/24 2140)   PRN Meds:.acetaminophen , LORazepam, melatonin, polyethylene glycol,  prochlorperazine   I have personally reviewed following labs and imaging studies  LABORATORY DATA: CBC: Recent Labs  Lab 04/02/24 1553 04/02/24 1623 04/03/24 0604 04/04/24 0341  WBC 9.9  --  10.0 9.0  NEUTROABS 8.0*  --  7.4  --   HGB 16.7* 17.7* 16.1* 15.3*  HCT 50.7* 52.0* 49.1* 47.4*  MCV 86.7  --  88.6 88.8  PLT 263  --  221 237    Basic Metabolic Panel: Recent Labs  Lab 04/02/24 1553 04/02/24 1623 04/03/24 0604 04/04/24 0341  NA 132* 133* 133* 134*  K 5.1 5.7* 3.9 4.2  CL 98 100 96* 101  CO2 23  --  20* 15*  GLUCOSE 112* 109* 84 73  BUN 20 29* 19 18  CREATININE 1.57* 1.60* 1.33* 1.33*  CALCIUM  8.9  --  8.6* 8.4*  MG  --   --  2.0 2.0  PHOS  --   --  5.7* 4.7*    GFR: Estimated Creatinine Clearance: 37.4 mL/min (A) (by C-G formula based on SCr of 1.33 mg/dL (H)).  Liver Function Tests: Recent Labs  Lab 04/02/24 1553 04/03/24 0604 04/04/24 0341  AST 50* 38 34  ALT 13 10 10   ALKPHOS 67 62 56  BILITOT 1.8* 1.4* 1.2  PROT 7.7 7.8  7.0  ALBUMIN 3.2* 3.1* 2.7*   No results for input(s): LIPASE, AMYLASE in the last 168 hours. No results for input(s): AMMONIA in the last 168 hours.  Coagulation Profile: Recent Labs  Lab 04/02/24 1553  INR 1.1    Cardiac Enzymes: No results for input(s): CKTOTAL, CKMB, CKMBINDEX, TROPONINI in the last 168 hours.  BNP (last 3 results) No results for input(s): PROBNP in the last 8760 hours.  Lipid Profile: No results for input(s): CHOL, HDL, LDLCALC, TRIG, CHOLHDL, LDLDIRECT in the last 72 hours.  Thyroid Function Tests: No results for input(s): TSH, T4TOTAL, FREET4, T3FREE, THYROIDAB in the last 72 hours.  Anemia Panel: No results for input(s): VITAMINB12, FOLATE, FERRITIN, TIBC, IRON, RETICCTPCT in the last 72 hours.  Urine analysis:    Component Value Date/Time   COLORURINE YELLOW 04/02/2024 2030   APPEARANCEUR CLEAR 04/02/2024 2030   LABSPEC 1.018  04/02/2024 2030   PHURINE 5.0 04/02/2024 2030   GLUCOSEU NEGATIVE 04/02/2024 2030   HGBUR SMALL (A) 04/02/2024 2030   BILIRUBINUR NEGATIVE 04/02/2024 2030   BILIRUBINUR negative 01/02/2021 1225   BILIRUBINUR negative 06/03/2015 1024   KETONESUR 20 (A) 04/02/2024 2030   PROTEINUR 100 (A) 04/02/2024 2030   UROBILINOGEN 2.0 (A) 01/02/2021 1225   NITRITE NEGATIVE 04/02/2024 2030   LEUKOCYTESUR NEGATIVE 04/02/2024 2030    Sepsis Labs: Lactic Acid, Venous    Component Value Date/Time   LATICACIDVEN 1.2 04/02/2024 2255    MICROBIOLOGY: Recent Results (from the past 240 hours)  Resp panel by RT-PCR (RSV, Flu A&B, Covid) Anterior Nasal Swab     Status: None   Collection Time: 04/02/24  4:42 PM   Specimen: Anterior Nasal Swab  Result Value Ref Range Status   SARS Coronavirus 2 by RT PCR NEGATIVE NEGATIVE Final   Influenza A by PCR NEGATIVE NEGATIVE Final   Influenza B by PCR NEGATIVE NEGATIVE Final    Comment: (NOTE) The Xpert Xpress SARS-CoV-2/FLU/RSV plus assay is intended as an aid in the diagnosis of influenza from Nasopharyngeal swab specimens and should not be used as a sole basis for treatment. Nasal washings and aspirates are unacceptable for Xpert Xpress SARS-CoV-2/FLU/RSV testing.  Fact Sheet for Patients: bloggercourse.com  Fact Sheet for Healthcare Providers: seriousbroker.it  This test is not yet approved or cleared by the [place]  FDA and has been authorized for detection and/or diagnosis of SARS-CoV-2 by FDA under an Emergency Use Authorization (EUA). This EUA will remain in effect (meaning this test can be used) for the duration of the COVID-19 declaration under Section 564(b)(1) of the Act, 21 U.S.C. section 360bbb-3(b)(1), unless the authorization is terminated or revoked.     Resp Syncytial Virus by PCR NEGATIVE NEGATIVE Final    Comment: (NOTE) Fact Sheet for  Patients: bloggercourse.com  Fact Sheet for Healthcare Providers: seriousbroker.it  This test is not yet approved or cleared by the [place]  FDA and has been authorized for detection and/or diagnosis of SARS-CoV-2 by FDA under an Emergency Use Authorization (EUA). This EUA will remain in effect (meaning this test can be used) for the duration of the COVID-19 declaration under Section 564(b)(1) of the Act, 21 U.S.C. section 360bbb-3(b)(1), unless the authorization is terminated or revoked.  Performed at [facility], [address] [place], [place] [address]   Culture, blood (Routine X 2) w Reflex to ID Panel     Status: None (Preliminary result)   Collection Time: 04/02/24 10:54 PM   Specimen: BLOOD LEFT HAND  Result Value  Ref Range Status   Specimen Description BLOOD LEFT HAND  Final   Special Requests   Final    BOTTLES DRAWN AEROBIC AND ANAEROBIC Blood Culture adequate volume   Culture   Final    NO GROWTH 2 DAYS Performed at [facility], [address], [place], [place] [address]    Report Status PENDING  Incomplete  Culture, blood (Routine X 2) w Reflex to ID Panel     Status: None (Preliminary result)   Collection Time: 04/03/24  7:03 PM   Specimen: BLOOD  Result Value Ref Range Status   Specimen Description BLOOD SITE NOT SPECIFIED  Final   Special Requests   Final    BOTTLES DRAWN AEROBIC ONLY Blood Culture results may not be optimal due to an inadequate volume of blood received in culture bottles   Culture   Final    NO GROWTH < 12 HOURS Performed at [facility], [address], [place], [place] [address]    Report Status PENDING  Incomplete    RADIOLOGY STUDIES/RESULTS: EEG adult Result Date: 04/03/2024 Shelton Arlin KIDD, MD     04/03/2024  2:43 PM Patient Name: HORTENCE CHARTER MRN: [id number] Epilepsy Attending: Arlin KIDD Shelton Referring Physician/Provider: Shona Terry SAILOR, DO Date:  04/03/2024 Duration: 22.39 mins Patient history: 78 year old female with altered mental status.  EEG to evaluate for seizure. Level of alertness: Awake AEDs during EEG study: Keppra Technical aspects: This EEG study was done with scalp electrodes positioned according to the 10-20 International system of electrode placement. Electrical activity was reviewed with band pass filter of 1-70Hz , sensitivity of 7 uV/mm, display speed of 19mm/sec with a 60Hz  notched filter applied as appropriate. EEG data were recorded continuously and digitally stored.  Video monitoring was available and reviewed as appropriate. Description: EEG showed continuous generalized 3 to 7 Hz theta-delta slowing. Hyperventilation and photic stimulation were not performed.   ABNORMALITY - Continuous slow, generalized IMPRESSION: This study is suggestive of generalized cerebral dysfunction (encephalopathy). No seizures or epileptiform discharges were seen throughout the recording. Arlin KIDD Shelton   DG Chest Portable 1 View Result Date: 04/02/2024 EXAM: 1 AP VIEW(S) XRAY OF THE CHEST 04/02/2024 04:10:00 PM COMPARISON: 02/25/2019 CLINICAL HISTORY: ?pna pna FINDINGS: LUNGS AND PLEURA: Lower lung predominant mild interstitial thickening is nonspecific. Left greater than right basilar airspace disease. No pleural effusion. No pneumothorax. HEART AND MEDIASTINUM: Normal heart size for AP portable technique. Patient rotated minimally right. BONES AND SOFT TISSUES: No acute osseous abnormality. Exam limited by AP portable technique and patient body habitus. IMPRESSION: 1. Limited AP portable radiographs due to patient body habitus 2. Left greater than right base airspace disease. This could represent atelectasis. especially at the left lung base, infection or aspiration cannot be excluded. Consider PA and lateral radiographs if possible, Electronically signed by: Rockey Kilts MD 04/02/2024 04:42 PM EST RP Workstation: [id number]   CT HEAD WO  CONTRAST Result Date: 04/02/2024 EXAM: CT HEAD WITHOUT CONTRAST 04/02/2024 03:56:34 PM TECHNIQUE: CT of the head was performed without the administration of intravenous contrast. Automated exposure control, iterative reconstruction, and/or weight based adjustment of the mA/kV was utilized to reduce the radiation dose to as low as reasonably achievable. COMPARISON: Head CT and MRI 03/23/2024. CLINICAL HISTORY: Neuro deficit, acute, stroke suspected. Altered mental status. History of seizures. FINDINGS: BRAIN AND VENTRICLES: There is persistent hypodensity/mild edema in the right frontoparietal operculum corresponding to the abnormal T2 hyperintensity and enhancement on the recent prior MRI. Small  mildly hyperdense hemorrhage in this region on the prior CT is no longer clearly evident. No new intracranial hemorrhage, acute large territory infarct, midline shift, hydrocephalus, or extra-axial fluid collection is identified. Confluent hypodensities elsewhere in the cerebral white matter bilaterally are similar to the prior CT and nonspecific but compatible with extensive chronic small vessel ischemic disease. There is heterogeneous hypodensity in the deep gray nuclei bilaterally, including in the right thalamus where enhancement was shown on the recent MRI. There is mild cerebral atrophy. Calcified atherosclerosis at the skull base. ORBITS: Bilateral cataract extraction. SINUSES: Small right mastoid effusion. The included paranasal sinuses are clear. SOFT TISSUES AND SKULL: No acute soft tissue abnormality. No skull fracture. IMPRESSION: 1. Persistent hypodensity/mild edema in the right frontoparietal operculum corresponding to signal abnormality on the recent MRI where concern was raised for possible infiltrative primary tumor. 2. No evidence of a new intracranial abnormality 3. Extensive chronic small vessel ischemic disease. Electronically signed by: Dasie Hamburg MD 04/02/2024 04:32 PM EST RP Workstation: [id number]      LOS: 2 days   Donalda Applebaum, MD  [facility]    To contact the attending provider between 7A-7P or the covering provider during after hours 7P-7A, please log into the web site [email or web address] and access using universal [place] password for that web site. If you do not have the password, please call the hospital operator.  04/04/2024, 8:07 AM

## 2024-04-05 DIAGNOSIS — G9341 Metabolic encephalopathy: Secondary | ICD-10-CM | POA: Diagnosis not present

## 2024-04-05 DIAGNOSIS — I10 Essential (primary) hypertension: Secondary | ICD-10-CM | POA: Diagnosis not present

## 2024-04-05 DIAGNOSIS — J189 Pneumonia, unspecified organism: Secondary | ICD-10-CM | POA: Diagnosis not present

## 2024-04-05 DIAGNOSIS — Z8673 Personal history of transient ischemic attack (TIA), and cerebral infarction without residual deficits: Secondary | ICD-10-CM | POA: Diagnosis not present

## 2024-04-05 DIAGNOSIS — F0390 Unspecified dementia without behavioral disturbance: Secondary | ICD-10-CM | POA: Diagnosis not present

## 2024-04-05 DIAGNOSIS — Z7189 Other specified counseling: Secondary | ICD-10-CM | POA: Diagnosis not present

## 2024-04-05 DIAGNOSIS — G9389 Other specified disorders of brain: Secondary | ICD-10-CM | POA: Diagnosis not present

## 2024-04-05 DIAGNOSIS — R569 Unspecified convulsions: Secondary | ICD-10-CM | POA: Diagnosis not present

## 2024-04-05 DIAGNOSIS — Z515 Encounter for palliative care: Secondary | ICD-10-CM | POA: Diagnosis not present

## 2024-04-05 DIAGNOSIS — R4182 Altered mental status, unspecified: Secondary | ICD-10-CM | POA: Diagnosis not present

## 2024-04-05 DIAGNOSIS — G939 Disorder of brain, unspecified: Secondary | ICD-10-CM | POA: Diagnosis not present

## 2024-04-05 NOTE — Progress Notes (Signed)
 Palliative:  HPI: 78 y.o. 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 Neurosurgery reports that tumor is not appropriate for surgical resection and recommend repeat MRI in 6-8 weeks and follow up with neuro-oncology. Admitted on 04/02/2024 with altered mental status with pneumonia.    Discussion with Dr. Raenelle and Dr. Buckley. Dr. Buckley plans to see [name] this afternoon likely after 3:30 pm. I met today with both sons at bedside. We reviewed her status as well as concerns for poor prognosis. We discussed in detail her underlying dementia, seizures, brain lesion, and pneumonia have all taken a toll. We reviewed that her pneumonia appears stable and we are treating with antibiotics but her body is not improving or bouncing back. She is sleeping all the time and is not eating/drinking. I worry that if this continues we are approaching end of life. Family understand. They ask very insightful questions. They are still struggling to grasp that she may have cancer and this is not something that runs in their family and this is shocking that this is likely what we are dealing with. They look forward to speaking with Dr. Buckley to obtain more information. I shared that he is the expert but she is not in a condition to tolerate treatment for a cancer due to poor functional status.   We further discussed that unfortunately the brain lesion or stroke would not really change the outcome. I worry that her underlying dementia is her most significant poor prognosticator. I discussed with family options moving forward. We reviewed code status and recommendations for DNR/DNI - after discussion they both agree.  We reviewed consideration of not escalating care if she declines further and they ultimately agree that this does not seem to align with what they believe their mother would want for herself. She is a very faithful, spiritual woman and would be ready to go to heaven and be with her [name] and her husband. We did discuss the reality of her quality of life and if this is acceptable to her at this stage. Family will continue to discuss. They favor a more comfort focused path. Continue treatment for pneumonia, allow more time for improvement (alertness, appetite/intake), and further discussion with oncology. If continues with no significant improvement and pending conversation with oncology family may be ready to move forward with considering hospice options tomorrow.   All questions/concerns addressed. Emotional support provided. Updated Dr. Raenelle, Dr. Buckley, TOC.   Exam: Sleeping but arouses with ease with a grin. She does have clearer speech with less congestion/wet quality today. She goes directly back asleep. Confused. No distress. VSS. Breathing regular, unlabored. Abd soft. Warm to touch.   Plan: - DNR/DNI - Family favoring more comfort focused path - Awaiting conversation with oncology this afternoon - Family will consider hospice options tomorrow based on progress  70 min  Vanessa Kitty, NP Palliative Medicine Team Pager [phone] (Please see [email or web address] for schedule) Team Phone [phone]

## 2024-04-05 NOTE — Plan of Care (Signed)

## 2024-04-05 NOTE — Progress Notes (Signed)
 PROGRESS NOTE        PATIENT DETAILS Name: [name] Age: 78 y.o. Sex: female Date of Birth: [birth year] Admit Date: 04/02/2024 Admitting Physician Terry LOISE Hurst, DO ERE:Xpoejumprx, Zachary, MD  Brief Summary: Patient is a 78 y.o.  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  She was brought to the ED on 11/17-for altered mental status-was found to have acute metabolic encephalopathy in the setting of PNA.  Significant events: 11/17>> admit to [facility].  Significant studies: 11/17>> CT head: Persistent hypodensity/mild edema right frontoparietal operculum 11/17>> CXR: Left> right airspace disease-atelectasis versus infection versus aspiration. 11/18>> EEG: No seizures.  Significant microbiology data: 11/17>> COVID/influenza/RSV PCR: Negative 11/17>> blood culture: No growth 11/18>> blood culture: No growth  Procedures: None  Consults: Palliative Neuro Oncology  Subjective: A bit more confused today compared to yesterday-asking me if she was still alive-does not answer simple questions appropriately like yesterday.  Objective: Vitals: Blood pressure 132/78, pulse 98, temperature 98.6 F (37 C), temperature source Oral, resp. rate 18, SpO2 91%.   Exam: Gen Exam:Alert awake-not in any distress HEENT:atraumatic, normocephalic Chest: B/L clear to auscultation anteriorly CVS:S1S2 regular Abdomen:soft non tender, non distended Extremities:no edema Neurology: Left-sided hemiparesis (LUE>> LLE) Skin: no rash  Pertinent Labs/Radiology:    Latest Ref Rng & Units 04/04/2024    3:41 AM 04/03/2024     6:04 AM 04/02/2024    4:23 PM  CBC  WBC 4.0 - 10.5 K/uL 9.0  10.0    Hemoglobin 12.0 - 15.0 g/dL 84.6  83.8  82.2   Hematocrit 36.0 - 46.0 % 47.4  49.1  52.0   Platelets 150 - 400 K/uL 237  221      Lab Results  Component Value Date   NA 134 (L) 04/04/2024   K 4.2 04/04/2024   CL 101 04/04/2024   CO2 15 (L) 04/04/2024      Assessment/Plan: Acute metabolic encephalopathy superimposed on dementia/delirium Secondary to sepsis physiology/aspiration pneumonia CT head with unchanged right frontal lobe lesion EEG negative for seizures Overall clinically improved but mentation continues to fluctuate-appears more worse today. Continue to treat underlying pneumonia Maintain delirium precautions.  Sepsis secondary to presumed aspiration pneumonia (likely related to recent dysphagia in the setting of CVA/right frontal lobe lesion) Sepsis physiology has resolved Recs narrowed down to Unasyn .  Oropharyngeal dysphagia Likely related to recent CVA versus right frontal lobe tumor SLP following-remains on full liquids.   AKI Mild Likely hemodynamically mediated Follow electrolytes periodically Avoid nephrotoxic agents.  Hyponatremia Mild Likely of no clinical significance Follow electrolytes periodically  Seizure Likely related to underlying right frontal lobe lesion Continue Keppra  Encephalopathy/confusion felt to be related to PNA/sepsis physiology rather than breakthrough seizures Spot EEG negative. Continue to closely.  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 Touched base with neuro-oncology-Dr. Buckley today-he thinks this is a low-grade tumor-he will try and see patient/Family  today-but unfortunately do not think she will be a candidate for aggressive care  given how frail she is with above-noted issues.  Palliative meeting with family today-unfortunately-continues to have dysphagia/mentation continues to wax/wane (worse today) with very poor oral intake.  Reasonable to consider comfort measures if she continues in this route.  HTN BP stable Amlodipine  HLD Statin  Polycythemia Mild Unclear etiology Stable for periodic follow-up.  GERD PPI  Dementia/cognitive dysfunction  Aricept  Class 2 Obesity: Estimated body mass index is 35.34 kg/m as calculated from the following:   Height as of 02/26/24: 5' 4 (1.626 m).   Weight as of 03/23/24: 93.4 kg.   Code status:   Code Status: Full Code   DVT Prophylaxis: enoxaparin  (LOVENOX ) injection 40 mg Start: 04/02/24 2000   Family Communication: Son-[name] Haig-[phone] left VM-11/19   Disposition Plan: Status is: Inpatient Remains inpatient appropriate because: Severity of illness   Planned Discharge Destination:Skilled nursing facility   Diet: Diet Order             Diet full liquid Room service appropriate? Yes with Assist; Fluid consistency: Thin  Diet effective now                     Antimicrobial agents: Anti-infectives (From admission, onward)    Start     Dose/Rate Route Frequency Ordered Stop   04/04/24 0945  Ampicillin-Sulbactam (UNASYN) 3 g in sodium chloride  0.9 % 100 mL IVPB        3 g 200 mL/hr over 30 Minutes Intravenous Every 6 hours 04/04/24 0846     04/03/24 0800  ceFEPIme (MAXIPIME) 2 g in sodium chloride  0.9 % 100 mL IVPB  Status:  Discontinued        2 g 200 mL/hr over 30 Minutes Intravenous Every 12 hours 04/02/24 2101 04/04/24 0820   04/03/24 0800  metroNIDAZOLE (FLAGYL) IVPB 500 mg  Status:  Discontinued        500 mg 100 mL/hr over 60 Minutes Intravenous 2 times daily 04/03/24 0747 04/04/24 0820   04/02/24 1930  vancomycin (VANCOCIN) IVPB 1000 mg/200 mL  premix  Status:  Discontinued        1,000 mg 200 mL/hr over 60 Minutes Intravenous  Once 04/02/24 1921 04/02/24 1922   04/02/24 1930  ceFEPIme (MAXIPIME) 2 g in sodium chloride  0.9 % 100 mL IVPB        2 g 200 mL/hr over 30 Minutes Intravenous  Once 04/02/24 1921 04/02/24 2006   04/02/24 1930  vancomycin (VANCOREADY) IVPB 2000 mg/400 mL        2,000 mg 200 mL/hr over 120 Minutes Intravenous  Once 04/02/24 1922 04/02/24 2228        MEDICATIONS: Scheduled Meds:  amLODipine  10 mg Oral Daily   donepezil  10 mg Oral Daily   enoxaparin  (LOVENOX ) injection  40 mg Subcutaneous Q24H   levETIRAcetam  500 mg Oral BID   pantoprazole   40 mg Oral Daily   prednisoLONE acetate  1 drop Right Eye QID   rosuvastatin   20 mg Oral Daily   Continuous Infusions:  ampicillin-sulbactam (UNASYN) IV 3 g (04/05/24 0855)   PRN Meds:.acetaminophen , LORazepam, melatonin, polyethylene glycol, prochlorperazine   I have personally reviewed following labs and imaging studies  LABORATORY DATA: CBC: Recent Labs  Lab 04/02/24 1553 04/02/24 1623 04/03/24 0604 04/04/24 0341  WBC 9.9  --  10.0 9.0  NEUTROABS 8.0*  --  7.4  --   HGB 16.7* 17.7* 16.1* 15.3*  HCT 50.7* 52.0* 49.1* 47.4*  MCV 86.7  --  88.6 88.8  PLT 263  --  221 237    Basic Metabolic Panel: Recent Labs  Lab 04/02/24 1553 04/02/24 1623 04/03/24 0604 04/04/24 0341  NA 132* 133* 133* 134*  K 5.1 5.7* 3.9 4.2  CL 98 100 96* 101  CO2 23  --  20* 15*  GLUCOSE 112* 109* 84 73  BUN 20 29* 19 18  CREATININE 1.57* 1.60* 1.33* 1.33*  CALCIUM  8.9  --  8.6* 8.4*  MG  --   --  2.0 2.0  PHOS  --   --  5.7* 4.7*    GFR: Estimated Creatinine Clearance: 37.4 mL/min (A) (by C-G formula based on SCr of 1.33 mg/dL (H)).  Liver Function Tests: Recent Labs  Lab 04/02/24 1553 04/03/24 0604 04/04/24 0341  AST 50* 38 34  ALT 13 10 10   ALKPHOS 67 62 56  BILITOT 1.8* 1.4* 1.2  PROT 7.7 7.8 7.0  ALBUMIN 3.2* 3.1* 2.7*   No results for  input(s): LIPASE, AMYLASE in the last 168 hours. No results for input(s): AMMONIA in the last 168 hours.  Coagulation Profile: Recent Labs  Lab 04/02/24 1553  INR 1.1    Cardiac Enzymes: No results for input(s): CKTOTAL, CKMB, CKMBINDEX, TROPONINI in the last 168 hours.  BNP (last 3 results) No results for input(s): PROBNP in the last 8760 hours.  Lipid Profile: No results for input(s): CHOL, HDL, LDLCALC, TRIG, CHOLHDL, LDLDIRECT in the last 72 hours.  Thyroid Function Tests: No results for input(s): TSH, T4TOTAL, FREET4, T3FREE, THYROIDAB in the last 72 hours.  Anemia Panel: No results for input(s): VITAMINB12, FOLATE, FERRITIN, TIBC, IRON, RETICCTPCT in the last 72 hours.  Urine analysis:    Component Value Date/Time   COLORURINE YELLOW 04/02/2024 2030   APPEARANCEUR CLEAR 04/02/2024 2030   LABSPEC 1.018 04/02/2024 2030   PHURINE 5.0 04/02/2024 2030   GLUCOSEU NEGATIVE 04/02/2024 2030   HGBUR SMALL (A) 04/02/2024 2030   BILIRUBINUR NEGATIVE 04/02/2024 2030   BILIRUBINUR negative 01/02/2021 1225   BILIRUBINUR negative 06/03/2015 1024   KETONESUR 20 (A) 04/02/2024 2030   PROTEINUR 100 (A) 04/02/2024 2030   UROBILINOGEN 2.0 (A) 01/02/2021 1225   NITRITE NEGATIVE 04/02/2024 2030   LEUKOCYTESUR NEGATIVE 04/02/2024 2030    Sepsis Labs: Lactic Acid, Venous    Component Value Date/Time   LATICACIDVEN 1.2 04/02/2024 2255    MICROBIOLOGY: Recent Results (from the past 240 hours)  Resp panel by RT-PCR (RSV, Flu A&B, Covid) Anterior Nasal Swab     Status: None   Collection Time: 04/02/24  4:42 PM   Specimen: Anterior Nasal Swab  Result Value Ref Range Status   SARS Coronavirus 2 by RT PCR NEGATIVE NEGATIVE Final   Influenza A by PCR NEGATIVE NEGATIVE Final   Influenza B by PCR NEGATIVE NEGATIVE Final    Comment: (NOTE) The Xpert Xpress SARS-CoV-2/FLU/RSV plus assay is intended as an aid in the diagnosis of  influenza from Nasopharyngeal swab specimens and should not be used as a sole basis for treatment. Nasal washings and aspirates are unacceptable for Xpert Xpress SARS-CoV-2/FLU/RSV testing.  Fact Sheet for Patients: bloggercourse.com  Fact Sheet for Healthcare Providers: seriousbroker.it  This test is not yet approved or cleared by the [place]  FDA and has been authorized for detection and/or diagnosis of SARS-CoV-2 by FDA under an Emergency Use Authorization (EUA). This EUA will remain in effect (meaning this test can be used) for the duration of the COVID-19 declaration under Section 564(b)(1) of the Act,  21 U.S.C. section 360bbb-3(b)(1), unless the authorization is terminated or revoked.     Resp Syncytial Virus by PCR NEGATIVE NEGATIVE Final    Comment: (NOTE) Fact Sheet for Patients: bloggercourse.com  Fact Sheet for Healthcare Providers: seriousbroker.it  This test is not yet approved or cleared by the [place]  FDA and has been authorized for detection and/or diagnosis of SARS-CoV-2 by FDA under an Emergency Use Authorization (EUA). This EUA will remain in effect (meaning this test can be used) for the duration of the COVID-19 declaration under Section 564(b)(1) of the Act, 21 U.S.C. section 360bbb-3(b)(1), unless the authorization is terminated or revoked.  Performed at [facility], [address], [place], [place] [address]   Culture, blood (Routine X 2) w Reflex to ID Panel     Status: None (Preliminary result)   Collection Time: 04/02/24 10:54 PM   Specimen: BLOOD LEFT HAND  Result Value Ref Range Status   Specimen Description BLOOD LEFT HAND  Final   Special Requests   Final    BOTTLES DRAWN AEROBIC AND ANAEROBIC Blood Culture adequate volume   Culture   Final    NO GROWTH 3 DAYS Performed at [facility], [address], [place],  [place] [address]    Report Status PENDING  Incomplete  Culture, blood (Routine X 2) w Reflex to ID Panel     Status: None (Preliminary result)   Collection Time: 04/03/24  7:03 PM   Specimen: BLOOD  Result Value Ref Range Status   Specimen Description BLOOD SITE NOT SPECIFIED  Final   Special Requests   Final    BOTTLES DRAWN AEROBIC ONLY Blood Culture results may not be optimal due to an inadequate volume of blood received in culture bottles   Culture   Final    NO GROWTH 2 DAYS Performed at [facility], [address], [place], [place] [address]    Report Status PENDING  Incomplete    RADIOLOGY STUDIES/RESULTS: EEG adult Result Date: 04/03/2024 Shelton Arlin KIDD, MD     04/03/2024  2:43 PM Patient Name: ANGELYNN LEMUS MRN: [id number] Epilepsy Attending: Arlin KIDD Shelton Referring Physician/Provider: Shona Terry SAILOR, DO Date: 04/03/2024 Duration: 22.39 mins Patient history: 78 year old female with altered mental status.  EEG to evaluate for seizure. Level of alertness: Awake AEDs during EEG study: Keppra Technical aspects: This EEG study was done with scalp electrodes positioned according to the 10-20 International system of electrode placement. Electrical activity was reviewed with band pass filter of 1-70Hz , sensitivity of 7 uV/mm, display speed of 29mm/sec with a 60Hz  notched filter applied as appropriate. EEG data were recorded continuously and digitally stored.  Video monitoring was available and reviewed as appropriate. Description: EEG showed continuous generalized 3 to 7 Hz theta-delta slowing. Hyperventilation and photic stimulation were not performed.   ABNORMALITY - Continuous slow, generalized IMPRESSION: This study is suggestive of generalized cerebral dysfunction (encephalopathy). No seizures or epileptiform discharges were seen throughout the recording. Priyanka KIDD Shelton     LOS: 3 days   Donalda Applebaum, MD  [facility]    To contact the attending provider between  7A-7P or the covering provider during after hours 7P-7A, please log into the web site [email or web address] and access using universal [place] password for that web site. If you do not have the password, please call the hospital operator.  04/05/2024, 10:25 AM

## 2024-04-05 NOTE — Progress Notes (Signed)
 Speech Language Pathology Treatment: Dysphagia  Patient Details Name: [name] MRN: [id number] DOB: Feb 10, 1946 Today's Date: 04/05/2024 Time: 1600-1620 SLP Time Calculation (min) (ACUTE ONLY): 20 min  Assessment / Plan / Recommendation Clinical Impression  Pt is more alert today but inattentive to functional tasks. She requires total A for feeding and Mod cueing to initiate oral transit and swallowing. Throat clearance and coughing did intermittently follow trials of thin liquids and purees today. Discussed with pt's sons that while repeating instrumental testing may be beneficial to gain more information, she is not currently demonstrating consistent alertness to participate. They are agreeable with continuing full liquids for now with ongoing SLP f/u to determine need for repeated MBS.    HPI HPI: [name] is an 78 yo female who presented to ED from SNF with somnolence 11/17. Dx acute metabolic encephalopathy, sepsis secondary to HAP. Recent admission 11/ 7 -11/ 11 for seizures, cognitive changes, dysphagia and dysarthria. Dx R frontal infiltrative high-grade lesion, suspicious for tumor - nsgy recommended repeat MRI x six weeks. MBS 03/26/24 moderate pharyngeal  dysphagia with penetration thin liquids to the vocal folds; left oral residue.  Dysphagia 3/thin liquids recommended. PMHx HTN, osteoarthritis, and memory loss weakness.      SLP Plan  Continue with current plan of care          Recommendations  Diet recommendations: Other(comment) (full liquid diet) Liquids provided via: Cup;Straw Medication Administration: Whole meds with puree Supervision: Full supervision/cueing for compensatory strategies;Staff to assist with self feeding Compensations: Minimize environmental distractions;Slow rate;Small sips/bites Postural Changes and/or Swallow Maneuvers: Seated upright 90 degrees                  Oral care BID   Frequent or constant Supervision/Assistance Dysphagia,  oropharyngeal phase (R13.12)     Continue with current plan of care     Damien Blumenthal, M.A., CCC-SLP Speech Language Pathology, Acute Rehabilitation Services  Secure Chat preferred [phone]   04/05/2024, 4:45 PM

## 2024-04-05 NOTE — Consult Note (Signed)
 [facility] Neuro-Oncology Consult Note  Patient Care Team: Hillman Bare, MD as PCP - General (Pulmonary Disease)  CHIEF COMPLAINTS/PURPOSE OF CONSULTATION:  Right Temporal Mass Altered Mental Status  HISTORY OF PRESENTING ILLNESS:  [name] 78 y.o. female presented with decreased responsiveness from her skilled nursing facility.  She was found to have pneumonia and acute kindey injury.  She had been recovering from prior admission for focal seizures.  Family describes modest improvement in awareness, attention and overall cognition since her initial admission 3 days ago.  No further seizure events.  Her appetite seems to have increased today and she is asking for steak.   MEDICAL HISTORY:  Past Medical History:  Diagnosis Date   Hypertension     SURGICAL HISTORY: Past Surgical History:  Procedure Laterality Date   BREAST EXCISIONAL BIOPSY Bilateral    MEDIAL PARTIAL KNEE REPLACEMENT Bilateral 06/07/2012    SOCIAL HISTORY: Social History   Socioeconomic History   Marital status: Widowed    Spouse name: Not on file   Number of children: Not on file   Years of education: Not on file   Highest education level: Not on file  Occupational History   Not on file  Tobacco Use   Smoking status: Never   Smokeless tobacco: Never  Vaping Use   Vaping status: Never Used  Substance and Sexual Activity   Alcohol use: No    Alcohol/week: 0.0 standard drinks of alcohol   Drug use: No   Sexual activity: Not Currently    Birth control/protection: Surgical    Comment: hysterectomy  Other Topics Concern   Not on file  Social History Narrative   Not on file   Social Drivers of Health   Financial Resource Strain: Not on file  Food Insecurity: No Food Insecurity (04/05/2024)   Hunger Vital Sign    Worried About Running Out of Food in the Last Year: Never true    Ran Out of Food in the Last Year: Never true  Transportation Needs: No Transportation Needs  (04/05/2024)   PRAPARE - Administrator, Civil Service (Medical): No    Lack of Transportation (Non-Medical): No  Physical Activity: Not on file  Stress: Not on file  Social Connections: Moderately Integrated (04/05/2024)   Social Connection and Isolation Panel    Frequency of Communication with Friends and Family: Three times a week    Frequency of Social Gatherings with Friends and Family: Three times a week    Attends Religious Services: More than 4 times per year    Active Member of Clubs or Organizations: Yes    Attends Banker Meetings: More than 4 times per year    Marital Status: Widowed  Intimate Partner Violence: Not At Risk (04/05/2024)   Humiliation, Afraid, Rape, and Kick questionnaire    Fear of Current or Ex-Partner: No    Emotionally Abused: No    Physically Abused: No    Sexually Abused: No    FAMILY HISTORY: Family History  Problem Relation Age of Onset   Stroke Mother    Stroke Father    Stroke Sister    Hypertension Brother     ALLERGIES:  is allergic to shellfish protein-containing drug products, carisoprodol-aspirin -codeine, codeine, doxycycline, soma [carisoprodol], and tetracycline.  MEDICATIONS:  Current Facility-Administered Medications  Medication Dose Route Frequency Provider Last Rate Last Admin   acetaminophen  (TYLENOL ) tablet 500 mg  500 mg Oral Q6H PRN Shona Laurence N, DO   500  mg at 04/03/24 2027   amLODipine (NORVASC) tablet 10 mg  10 mg Oral Daily Shona Laurence N, DO   10 mg at 04/05/24 0850   Ampicillin-Sulbactam (UNASYN) 3 g in sodium chloride  0.9 % 100 mL IVPB  3 g Intravenous Q6H Reome, Earle J, RPH 200 mL/hr at 04/05/24 1538 3 g at 04/05/24 1538   donepezil (ARICEPT) tablet 10 mg  10 mg Oral Daily Raenelle Donalda HERO, MD   10 mg at 04/05/24 0850   enoxaparin  (LOVENOX ) injection 40 mg  40 mg Subcutaneous Q24H Shona Laurence N, DO   40 mg at 04/04/24 2100   levETIRAcetam (KEPPRA) tablet 500 mg  500 mg Oral BID Shona Laurence N, DO   500 mg at 04/05/24 0849   LORazepam (ATIVAN) injection 2 mg  2 mg Intravenous Q6H PRN Shona Laurence N, DO       melatonin tablet 5 mg  5 mg Oral QHS PRN Shona Laurence SAILOR, DO       pantoprazole  (PROTONIX ) EC tablet 40 mg  40 mg Oral Daily Ghimire, Shanker M, MD   40 mg at 04/05/24 0850   polyethylene glycol (MIRALAX / GLYCOLAX) packet 17 g  17 g Oral Daily PRN Shona Laurence N, DO       prednisoLONE acetate (PRED FORTE) 1 % ophthalmic suspension 1 drop  1 drop Right Eye QID Raenelle Donalda HERO, MD   1 drop at 04/05/24 1422   prochlorperazine (COMPAZINE) injection 5 mg  5 mg Intravenous Q6H PRN Shona Laurence SAILOR, DO       rosuvastatin  (CRESTOR ) tablet 20 mg  20 mg Oral Daily Shona Laurence N, DO   20 mg at 04/05/24 9149    REVIEW OF SYSTEMS:   Constitutional: Denies fevers, chills or abnormal weight loss Eyes: Denies blurriness of vision Ears, nose, mouth, throat, and face: Denies mucositis or sore throat Respiratory: Denies cough, dyspnea or wheezes Cardiovascular: Denies palpitation, chest discomfort or lower extremity swelling Gastrointestinal:  Denies nausea, constipation, diarrhea GU: Denies dysuria or incontinence Skin: Denies abnormal skin rashes Neurological: Per HPI Musculoskeletal: Denies joint pain, back or neck discomfort. No decrease in ROM Behavioral/Psych: Denies anxiety, disturbance in thought content, and mood instability   PHYSICAL EXAMINATION: Vitals:   04/05/24 1244 04/05/24 1556  BP: 120/75 127/82  Pulse: 95 92  Resp: 18 20  Temp: 98.6 F (37 C) 98 F (36.7 C)  SpO2: 93% 93%   KPS: 60. General: Alert, cooperative, pleasant, in no acute distress Head: Normal EENT: No conjunctival injection or scleral icterus. Oral mucosa moist Lungs: Resp effort normal Cardiac: Regular rate and rhythm Abdomen: Soft, non-distended abdomen Skin: No rashes cyanosis or petechiae. Extremities: No clubbing or edema  NEUROLOGIC EXAM: Mental Status: Awake, but drowsy.   Attentive to examiner with verbal stimulation.  Struggles with lateralized or two step commands. Psychomotor slowing, impaired recall.   Cranial Nerves: Visual acuity is grossly normal. Visual fields are full. Extra-ocular movements intact. No ptosis. Face is symmetric, tongue midline. Motor: Tone and bulk are normal. Power is full in both arms and legs, but some lag with left arm. Reflexes are symmetric, no pathologic reflexes present. Intact finger to nose bilaterally Sensory: Intact to light touch and temperature Gait: Deferred   LABORATORY DATA:  I have reviewed the data as listed Lab Results  Component Value Date   WBC 9.0 04/04/2024   HGB 15.3 (H) 04/04/2024   HCT 47.4 (H) 04/04/2024   MCV 88.8 04/04/2024   PLT  237 04/04/2024   Recent Labs    04/02/24 1553 04/02/24 1623 04/03/24 0604 04/04/24 0341  NA 132* 133* 133* 134*  K 5.1 5.7* 3.9 4.2  CL 98 100 96* 101  CO2 23  --  20* 15*  GLUCOSE 112* 109* 84 73  BUN 20 29* 19 18  CREATININE 1.57* 1.60* 1.33* 1.33*  CALCIUM  8.9  --  8.6* 8.4*  GFRNONAA 33*  --  40* 40*  PROT 7.7  --  7.8 7.0  ALBUMIN 3.2*  --  3.1* 2.7*  AST 50*  --  38 34  ALT 13  --  10 10  ALKPHOS 67  --  62 56  BILITOT 1.8*  --  1.4* 1.2    ASSESSMENT & PLAN:  Right Temporal Mass Metabolic Encephalopathy  [name] presents with clinical syndrome consistent with metabolic encephalopathy, secondary to community acquired pneumonia, acute kidney injury.  She had also not recovered fully from post-ictal encephalopathy, prior admission from this month.  Her age and baseline cognitive impairment likely explain her slow recovery and overall sensitivity to metabolic disturbance.  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  This lesion is the source of her seizures, but we are doubtful that it is (by itself) causing significant cognitive impairment.  It's possible  that this lesion/tumor could have been present for multiple years without causing any focal or cognitive symptoms.  We have recommended repeat MRI next month and follow up in brain tumor clinic.  Do not recommend biopsy or any surgery at this time. Family expressed understanding.  They are hopeful for continued gradual recovery from her concurrent medical problems.    Otherwise recommended continuing Keppra 500mg  BID, remaining off steroids.  She has appt scheduled already for MRI and follow up.  All questions were answered. The patient knows to call the clinic with any problems, questions or concerns.  I personally spent a total of 60 minutes including counseling and review of test results.     Bridget Westbrooks K Natania Finigan, MD 04/05/2024 4:12 PM

## 2024-04-06 DIAGNOSIS — R4182 Altered mental status, unspecified: Secondary | ICD-10-CM | POA: Diagnosis not present

## 2024-04-06 DIAGNOSIS — Z8673 Personal history of transient ischemic attack (TIA), and cerebral infarction without residual deficits: Secondary | ICD-10-CM | POA: Diagnosis not present

## 2024-04-06 DIAGNOSIS — I10 Essential (primary) hypertension: Secondary | ICD-10-CM | POA: Diagnosis not present

## 2024-04-06 DIAGNOSIS — Z515 Encounter for palliative care: Secondary | ICD-10-CM | POA: Diagnosis not present

## 2024-04-06 DIAGNOSIS — Z9189 Other specified personal risk factors, not elsewhere classified: Secondary | ICD-10-CM

## 2024-04-06 DIAGNOSIS — R1312 Dysphagia, oropharyngeal phase: Secondary | ICD-10-CM

## 2024-04-06 DIAGNOSIS — F0390 Unspecified dementia without behavioral disturbance: Secondary | ICD-10-CM | POA: Diagnosis not present

## 2024-04-06 DIAGNOSIS — Z7189 Other specified counseling: Secondary | ICD-10-CM | POA: Diagnosis not present

## 2024-04-06 DIAGNOSIS — J189 Pneumonia, unspecified organism: Secondary | ICD-10-CM | POA: Diagnosis not present

## 2024-04-06 LAB — BASIC METABOLIC PANEL WITH GFR
Anion gap: 17 — ABNORMAL HIGH (ref 5–15)
BUN: 22 mg/dL (ref 8–23)
CO2: 15 mmol/L — ABNORMAL LOW (ref 22–32)
Calcium: 8.6 mg/dL — ABNORMAL LOW (ref 8.9–10.3)
Chloride: 101 mmol/L (ref 98–111)
Creatinine, Ser: 1.22 mg/dL — ABNORMAL HIGH (ref 0.44–1.00)
GFR, Estimated: 45 mL/min — ABNORMAL LOW (ref >60)
Glucose, Bld: 75 mg/dL (ref 70–99)
Potassium: 3.4 mmol/L — ABNORMAL LOW (ref 3.5–5.1)
Sodium: 133 mmol/L — ABNORMAL LOW (ref 135–145)

## 2024-04-06 MED ORDER — POTASSIUM CHLORIDE 20 MEQ PO PACK
40.0000 meq | PACK | Freq: Once | ORAL | Status: AC
  Administered 2024-04-06: 40 meq via ORAL
  Filled 2024-04-06: qty 2

## 2024-04-06 NOTE — Plan of Care (Signed)

## 2024-04-06 NOTE — Progress Notes (Signed)
 PROGRESS NOTE        PATIENT DETAILS Name: [name] Age: 78 y.o. Sex: female Date of Birth: Jun 14, 1945 Admit Date: 04/02/2024 Admitting Physician Terry LOISE Hurst, DO ERE:Xpoejumprx, Zachary, MD  Brief Summary: Patient is a 78 y.o.  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  She was brought to the ED on 11/17-for altered mental status-was found to have acute metabolic encephalopathy in the setting of PNA.  Significant events: 11/17>> admit to [facility].  Significant studies: 11/17>> CT head: Persistent hypodensity/mild edema right frontoparietal operculum 11/17>> CXR: Left> right airspace disease-atelectasis versus infection versus aspiration. 11/18>> EEG: No seizures.  Significant microbiology data: 11/17>> COVID/influenza/RSV PCR: Negative 11/17>> blood culture: No growth 11/18>> blood culture: No growth  Procedures: None  Consults: Palliative Neuro Oncology  Subjective: More awake/alert compared to yesterday-able to answer some simple questions today.  No major issues overnight.  Per nursing staff-she is eating some-appetite still seems to be somewhat slightly improved  Objective: Vitals: Blood pressure 124/89, pulse 91, temperature 98.3 F (36.8 C), temperature source Oral, resp. rate 15, SpO2 92%.   Exam: Gen Exam: No acute distress-pleasantly confused. HEENT:atraumatic, normocephalic Chest: B/L clear to auscultation anteriorly CVS:S1S2 regular Abdomen:soft non tender, non distended Extremities:no edema Neurology: Left hemiparesis (LUE>> LLE) Skin: no rash  Pertinent Labs/Radiology:     Latest Ref Rng & Units 04/04/2024    3:41 AM 04/03/2024    6:04 AM 04/02/2024    4:23 PM  CBC  WBC 4.0 - 10.5 K/uL 9.0  10.0    Hemoglobin 12.0 - 15.0 g/dL 84.6  83.8  82.2   Hematocrit 36.0 - 46.0 % 47.4  49.1  52.0   Platelets 150 - 400 K/uL 237  221      Lab Results  Component Value Date   NA 133 (L) 04/06/2024   K 3.4 (L) 04/06/2024   CL 101 04/06/2024   CO2 15 (L) 04/06/2024      Assessment/Plan: Acute metabolic encephalopathy superimposed on dementia/delirium Secondary to sepsis physiology/aspiration pneumonia CT head with unchanged right frontal lobe lesion EEG negative for seizures Overall clinically improved but mentation continues to fluctuate-appears more worse today. Continue to treat underlying pneumonia Maintain delirium precautions.  Sepsis secondary to presumed aspiration pneumonia (likely related to recent dysphagia in the setting of CVA/right frontal lobe lesion) Sepsis physiology has resolved Overall improved-remains on Unasyn   Oropharyngeal dysphagia Likely related to recent CVA versus right frontal lobe tumor SLP following-remains on full liquids.   AKI Mild Likely hemodynamically mediated Follow electrolytes periodically Avoid nephrotoxic agents.  Hyponatremia Mild Likely of no clinical significance Follow electrolytes periodically  Seizure Likely related to underlying right frontal lobe lesion Continue Keppra  Encephalopathy/confusion felt to be related to PNA/sepsis physiology rather than breakthrough seizures Spot EEG negative. Continue to closely.  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  She seems to be a bit  more awake/alert today-per nursing staff-she was able to eat a bit more yesterday.  Discussed with nursing staff-they will call SLP today to see if diet can be upgraded.  Palliative care following for ongoing goals of care discussion-although glioma hopefully will be benign/low-grade pathology-with dementia/frailty/ debility-she will remain at risk for recurrent hospitalization.  HTN BP stable Amlodipine   HLD Statin  Polycythemia Mild Unclear etiology Stable for periodic follow-up.  GERD PPI  Dementia/cognitive dysfunction  Aricept   Class 2 Obesity: Estimated body mass index is 35.34 kg/m as calculated from the following:   Height as of 02/26/24: 5' 4 (1.626 m).   Weight as of 03/23/24: 93.4 kg.   Code status:   Code Status: Limited: Do not attempt resuscitation (DNR) -DNR-LIMITED -Do Not Intubate/DNI    DVT Prophylaxis: enoxaparin  (LOVENOX ) injection 40 mg Start: 04/02/24 2000   Family Communication: Son-[name] Moulton-[phone] left VM-11/19   Disposition Plan: Status is: Inpatient Remains inpatient appropriate because: Severity of illness   Planned Discharge Destination:Skilled nursing facility   Diet: Diet Order             Diet full liquid Room service appropriate? Yes with Assist; Fluid consistency: Thin  Diet effective now                     Antimicrobial agents: Anti-infectives (From admission, onward)    Start     Dose/Rate Route Frequency Ordered Stop   04/04/24 0945  Ampicillin -Sulbactam (UNASYN ) 3 g in sodium chloride  0.9 % 100 mL IVPB        3 g 200 mL/hr over 30 Minutes Intravenous Every 6 hours 04/04/24 0846     04/03/24 0800  ceFEPIme  (MAXIPIME ) 2 g in sodium chloride  0.9 % 100 mL IVPB  Status:  Discontinued        2 g 200 mL/hr over 30 Minutes Intravenous Every 12 hours 04/02/24 2101 04/04/24 0820   04/03/24 0800  metroNIDAZOLE  (FLAGYL ) IVPB  500 mg  Status:  Discontinued        500 mg 100 mL/hr over 60 Minutes Intravenous 2 times daily 04/03/24 0747 04/04/24 0820   04/02/24 1930  vancomycin  (VANCOCIN ) IVPB 1000 mg/200 mL premix  Status:  Discontinued        1,000 mg 200 mL/hr over 60 Minutes Intravenous  Once 04/02/24 1921 04/02/24 1922   04/02/24 1930  ceFEPIme  (MAXIPIME ) 2 g in sodium chloride  0.9 % 100 mL IVPB        2 g 200 mL/hr over 30 Minutes Intravenous  Once 04/02/24 1921 04/02/24 2006   04/02/24 1930  vancomycin  (VANCOREADY) IVPB 2000 mg/400 mL        2,000 mg 200 mL/hr over 120 Minutes Intravenous  Once 04/02/24 1922 04/02/24 2228        MEDICATIONS: Scheduled Meds:  amLODipine   10 mg Oral Daily   donepezil   10 mg Oral Daily   enoxaparin  (LOVENOX ) injection  40 mg Subcutaneous Q24H   levETIRAcetam   500 mg Oral BID   pantoprazole   40 mg Oral Daily   prednisoLONE  acetate  1 drop Right Eye QID   rosuvastatin   20 mg Oral Daily   Continuous Infusions:  ampicillin -sulbactam (UNASYN ) IV 3 g (04/06/24 0926)   PRN Meds:.acetaminophen , LORazepam , melatonin, polyethylene glycol, prochlorperazine    I have personally reviewed following labs and imaging studies  LABORATORY DATA: CBC: Recent Labs  Lab 04/02/24 1553 04/02/24 1623 04/03/24 0604 04/04/24 0341  WBC 9.9  --  10.0 9.0  NEUTROABS 8.0*  --  7.4  --   HGB 16.7* 17.7* 16.1* 15.3*  HCT 50.7* 52.0* 49.1* 47.4*  MCV 86.7  --  88.6 88.8  PLT 263  --  221 237    Basic Metabolic Panel: Recent Labs  Lab 04/02/24 1553 04/02/24 1623 04/03/24 0604 04/04/24 0341 04/06/24 0250  NA 132* 133* 133* 134* 133*  K 5.1 5.7* 3.9 4.2 3.4*  CL 98 100 96* 101 101  CO2 23  --  20* 15* 15*  GLUCOSE 112* 109* 84 73 75  BUN 20 29* 19 18 22   CREATININE 1.57* 1.60* 1.33* 1.33* 1.22*  CALCIUM  8.9  --  8.6* 8.4* 8.6*  MG  --   --  2.0 2.0  --   PHOS  --   --  5.7* 4.7*  --     GFR: Estimated Creatinine Clearance: 40.8 mL/min (A) (by C-G formula based on SCr of  1.22 mg/dL (H)).  Liver Function Tests: Recent Labs  Lab 04/02/24 1553 04/03/24 0604 04/04/24 0341  AST 50* 38 34  ALT 13 10 10   ALKPHOS 67 62 56  BILITOT 1.8* 1.4* 1.2  PROT 7.7 7.8 7.0  ALBUMIN 3.2* 3.1* 2.7*   No results for input(s): LIPASE, AMYLASE in the last 168 hours. No results for input(s): AMMONIA in the last 168 hours.  Coagulation Profile: Recent Labs  Lab 04/02/24 1553  INR 1.1    Cardiac Enzymes: No results for input(s): CKTOTAL, CKMB, CKMBINDEX, TROPONINI in the last 168 hours.  BNP (last 3 results) No results for input(s): PROBNP in the last 8760 hours.  Lipid Profile: No results for input(s): CHOL, HDL, LDLCALC, TRIG, CHOLHDL, LDLDIRECT in the last 72 hours.  Thyroid Function Tests: No results for input(s): TSH, T4TOTAL, FREET4, T3FREE, THYROIDAB in the last 72 hours.  Anemia Panel: No results for input(s): VITAMINB12, FOLATE, FERRITIN, TIBC, IRON, RETICCTPCT in the last 72 hours.  Urine analysis:    Component Value Date/Time   COLORURINE YELLOW 04/02/2024 2030   APPEARANCEUR CLEAR 04/02/2024 2030   LABSPEC 1.018 04/02/2024 2030   PHURINE 5.0 04/02/2024 2030   GLUCOSEU NEGATIVE 04/02/2024 2030   HGBUR SMALL (A) 04/02/2024 2030   BILIRUBINUR NEGATIVE 04/02/2024 2030   BILIRUBINUR negative 01/02/2021 1225   BILIRUBINUR negative 06/03/2015 1024   KETONESUR 20 (A) 04/02/2024 2030   PROTEINUR 100 (A) 04/02/2024 2030   UROBILINOGEN 2.0 (A) 01/02/2021 1225   NITRITE NEGATIVE 04/02/2024 2030   LEUKOCYTESUR NEGATIVE 04/02/2024 2030    Sepsis Labs: Lactic Acid, Venous    Component Value Date/Time   LATICACIDVEN 1.2 04/02/2024 2255    MICROBIOLOGY: Recent Results (from the past 240 hours)  Resp panel by RT-PCR (RSV, Flu A&B, Covid) Anterior Nasal Swab     Status: None   Collection Time: 04/02/24  4:42 PM   Specimen: Anterior Nasal Swab  Result Value Ref Range Status   SARS Coronavirus 2 by  RT PCR NEGATIVE NEGATIVE Final   Influenza A by PCR NEGATIVE NEGATIVE Final   Influenza B by PCR NEGATIVE NEGATIVE Final    Comment: (NOTE) The Xpert Xpress SARS-CoV-2/FLU/RSV plus assay is intended as an aid in the diagnosis of influenza from Nasopharyngeal swab specimens and should not be used as a sole basis for treatment. Nasal washings and aspirates are unacceptable for Xpert Xpress SARS-CoV-2/FLU/RSV testing.  Fact Sheet for Patients: bloggercourse.com  Fact Sheet for Healthcare Providers: seriousbroker.it  This test is not yet approved or cleared by the [place]  FDA and has been authorized for  detection and/or diagnosis of SARS-CoV-2 by FDA under an Emergency Use Authorization (EUA). This EUA will remain in effect (meaning this test can be used) for the duration of the COVID-19 declaration under Section 564(b)(1) of the Act, 21 U.S.C. section 360bbb-3(b)(1), unless the authorization is terminated or revoked.     Resp Syncytial Virus by PCR NEGATIVE NEGATIVE Final    Comment: (NOTE) Fact Sheet for Patients: bloggercourse.com  Fact Sheet for Healthcare Providers: seriousbroker.it  This test is not yet approved or cleared by the [place]  FDA and has been authorized for detection and/or diagnosis of SARS-CoV-2 by FDA under an Emergency Use Authorization (EUA). This EUA will remain in effect (meaning this test can be used) for the duration of the COVID-19 declaration under Section 564(b)(1) of the Act, 21 U.S.C. section 360bbb-3(b)(1), unless the authorization is terminated or revoked.  Performed at [facility], [address] [place], [place] [address]   Culture, blood (Routine X 2) w Reflex to ID Panel     Status: None (Preliminary result)   Collection Time: 04/02/24 10:54 PM   Specimen: BLOOD LEFT HAND  Result Value Ref Range Status   Specimen  Description BLOOD LEFT HAND  Final   Special Requests   Final    BOTTLES DRAWN AEROBIC AND ANAEROBIC Blood Culture adequate volume   Culture   Final    NO GROWTH 4 DAYS Performed at [facility], [address] [place], [place] [address]    Report Status PENDING  Incomplete  Culture, blood (Routine X 2) w Reflex to ID Panel     Status: None (Preliminary result)   Collection Time: 04/03/24  7:03 PM   Specimen: BLOOD  Result Value Ref Range Status   Specimen Description BLOOD SITE NOT SPECIFIED  Final   Special Requests   Final    BOTTLES DRAWN AEROBIC ONLY Blood Culture results may not be optimal due to an inadequate volume of blood received in culture bottles   Culture   Final    NO GROWTH 3 DAYS Performed at [facility], [address], [place], [place] [address]    Report Status PENDING  Incomplete    RADIOLOGY STUDIES/RESULTS: No results found.    LOS: 4 days   Donalda Applebaum, MD  [facility]    To contact the attending provider between 7A-7P or the covering provider during after hours 7P-7A, please log into the web site [email or web address] and access using universal [place] password for that web site. If you do not have the password, please call the hospital operator.  04/06/2024, 10:18 AM

## 2024-04-06 NOTE — Progress Notes (Signed)
                                                                                                                                                                                                          Palliative Medicine Progress Note   Patient Name: [name]       Date: 04/06/2024 DOB: 1945/09/05  Age: 78 y.o. MRN#: [id number] Attending Physician: Vanessa Donalda HERO, MD Primary Care Physician: Hillman Bare, MD Admit Date: 04/02/2024  Reason for Consultation/Follow-up: {Reason for Consult:23484}  HPI/Patient Profile: 78 y.o. 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 Neurosurgery reports that tumor is not appropriate for surgical resection and recommend repeat MRI in 6-8 weeks and follow up with neuro-oncology. Admitted on 04/02/2024 with altered mental status with pneumonia.   Subjective: ***  Objective:  Physical Exam Vitals reviewed.  Constitutional:      General: She is sleeping. She is not in acute distress.    Comments: Frail and chronically ill-appearing  Pulmonary:     Effort: Pulmonary effort is normal.  Neurological:     Mental Status: She is easily aroused.              LBM: Last BM Date : 04/04/24     Palliative Assessment/Data: ***     Palliative Medicine Assessment & Plan   Assessment: Principal Problem:   AMS (altered mental status)    Recommendations/Plan: ***  Goals of Care and Additional Recommendations: Limitations on Scope of Treatment: {Recommended Scope and Preferences:21019}  Code Status:   Prognosis:  {Palliative Care Prognosis:23504}  Discharge Planning: {Palliative dispostion:23505}  Care plan was discussed  with ***  Thank you for allowing the Palliative Medicine Team to assist in the care of this patient.   ***   Recardo KATHEE Loll, NP   Please contact Palliative Medicine Team phone at [phone] for questions and concerns.  For individual providers, please see AMION.

## 2024-04-06 NOTE — Progress Notes (Signed)
 Physical Therapy Treatment Patient Details Name: [name] MRN: [id number] DOB: [birth year] Today's Date: 04/06/2024   History of Present Illness 78 y.o. female presents to [facility] 04/02/24 with AMS and somnolence. Found to have sepsis due to PNA. 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 Recently d/c from hospital to SNF on 11/11. 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    PT Comments  Pt with poor tolerance to treatment today. Pt noted to be lethargic today not following commands very well. +2 total A to sit EOB for ~3 minutes via helicopter method. No change in DC/DME recs at this time. PT will continue to follow.     If plan is discharge home, recommend the following: Two people to help with walking and/or transfers;Two people to help with bathing/dressing/bathroom;Direct supervision/assist for medications management;Direct supervision/assist for financial management;Assist for transportation;Help with stairs or ramp for entrance;Supervision due to cognitive status   Can travel by private vehicle     No  Equipment Recommendations  Wheelchair (measurements PT);Wheelchair cushion (measurements PT);Hospital bed;Hoyer lift    Recommendations for Other Services       Precautions / Restrictions Precautions Precautions: Fall Recall of Precautions/Restrictions: Impaired Restrictions Weight Bearing Restrictions Per Provider Order: No     Mobility  Bed Mobility Overal bed mobility: Needs Assistance Bed Mobility: Supine to Sit, Sit to Supine     Supine to sit: +2 for physical assistance, Total assist Sit to supine: +2 for physical assistance, Total assist   General bed mobility comments: +2 total A via helicopter  method.    Transfers                   General transfer comment: Deferred due to level of arousal    Ambulation/Gait                   Stairs             Wheelchair Mobility     Tilt Bed    Modified Rankin (Stroke Patients Only)       Balance Overall balance assessment: Needs assistance Sitting-balance support: Bilateral upper extremity supported, Feet unsupported Sitting balance-Leahy Scale: Poor Sitting balance - Comments: posterior lean                                    Communication Communication Communication: Impaired Factors Affecting Communication: Reduced clarity of speech;Difficulty expressing self  Cognition Arousal: Lethargic Behavior During Therapy: Flat affect   PT - Cognitive impairments: History of cognitive impairments, Difficult to assess Difficult to assess due to: Level of arousal                     PT - Cognition Comments: Able to slightly open eyes, however, would not make eye contact or keep eyes open. Unable to follow other commands for movement. Following commands: Impaired Following commands impaired: Follows one step commands with increased time    Cueing Cueing Techniques: Verbal cues, Tactile cues  Exercises      General Comments General comments (skin integrity, edema, etc.): VSS      Pertinent Vitals/Pain Pain Assessment Pain Assessment: Faces Faces Pain Scale: Hurts even more Pain Location: LLE with movement Pain Descriptors /  Indicators: Grimacing, Guarding, Moaning, Discomfort Pain Intervention(s): Monitored during session, Limited activity within patient's tolerance, Repositioned    Home Living                          Prior Function            PT Goals (current goals can now be found in the care plan section) Progress towards PT goals: Progressing toward goals    Frequency    Min 2X/week      PT Plan      Co-evaluation               AM-PAC PT 6 Clicks Mobility   Outcome Measure  Help needed turning from your back to your side while in a flat bed without using bedrails?: Total Help needed moving from lying on your back to sitting on the side of a flat bed without using bedrails?: Total Help needed moving to and from a bed to a chair (including a wheelchair)?: Total Help needed standing up from a chair using your arms (e.g., wheelchair or bedside chair)?: Total Help needed to walk in hospital room?: Total Help needed climbing 3-5 steps with a railing? : Total 6 Click Score: 6    End of Session   Activity Tolerance: Patient limited by lethargy Patient left: in bed;with call bell/phone within reach;with bed alarm set;with family/visitor present Nurse Communication: Mobility status PT Visit Diagnosis: Repeated falls (R29.6);Muscle weakness (generalized) (M62.81);Difficulty in walking, not elsewhere classified (R26.2);Hemiplegia and hemiparesis Hemiplegia - Right/Left: Left Hemiplegia - dominant/non-dominant: Non-dominant Hemiplegia - caused by: Nontraumatic intracerebral hemorrhage     Time: 8491-8481 PT Time Calculation (min) (ACUTE ONLY): 10 min  Charges:    $Therapeutic Activity: 8-22 mins PT General Charges $$ ACUTE PT VISIT: 1 Visit                     [name], PT, DPT Acute Rehab Services [phone]    [name] 04/06/2024, 3:41 PM

## 2024-04-07 DIAGNOSIS — G939 Disorder of brain, unspecified: Secondary | ICD-10-CM

## 2024-04-07 DIAGNOSIS — Z7189 Other specified counseling: Secondary | ICD-10-CM | POA: Diagnosis not present

## 2024-04-07 DIAGNOSIS — Z515 Encounter for palliative care: Secondary | ICD-10-CM | POA: Diagnosis not present

## 2024-04-07 DIAGNOSIS — F0390 Unspecified dementia without behavioral disturbance: Secondary | ICD-10-CM

## 2024-04-07 LAB — BASIC METABOLIC PANEL WITH GFR
Anion gap: 14 (ref 5–15)
BUN: 18 mg/dL (ref 8–23)
CO2: 19 mmol/L — ABNORMAL LOW (ref 22–32)
Calcium: 8.3 mg/dL — ABNORMAL LOW (ref 8.9–10.3)
Chloride: 102 mmol/L (ref 98–111)
Creatinine, Ser: 1.12 mg/dL — ABNORMAL HIGH (ref 0.44–1.00)
GFR, Estimated: 50 mL/min — ABNORMAL LOW (ref >60)
Glucose, Bld: 85 mg/dL (ref 70–99)
Potassium: 3.5 mmol/L (ref 3.5–5.1)
Sodium: 135 mmol/L (ref 135–145)

## 2024-04-07 LAB — CULTURE, BLOOD (ROUTINE X 2)
Culture: NO GROWTH
Special Requests: ADEQUATE

## 2024-04-07 LAB — MAGNESIUM: Magnesium: 1.9 mg/dL (ref 1.7–2.4)

## 2024-04-07 LAB — PHOSPHORUS: Phosphorus: 3.5 mg/dL (ref 2.5–4.6)

## 2024-04-07 MED ORDER — POTASSIUM CHLORIDE CRYS ER 20 MEQ PO TBCR
40.0000 meq | EXTENDED_RELEASE_TABLET | Freq: Once | ORAL | Status: AC
  Administered 2024-04-07: 40 meq via ORAL
  Filled 2024-04-07: qty 2

## 2024-04-07 MED ORDER — AMOXICILLIN-POT CLAVULANATE 400-57 MG/5ML PO SUSR
800.0000 mg | Freq: Two times a day (BID) | ORAL | Status: AC
  Administered 2024-04-07 (×2): 800 mg via ORAL
  Filled 2024-04-07 (×2): qty 10

## 2024-04-07 MED ORDER — MAGNESIUM SULFATE 2 GM/50ML IV SOLN
2.0000 g | Freq: Once | INTRAVENOUS | Status: AC
  Administered 2024-04-07: 2 g via INTRAVENOUS
  Filled 2024-04-07: qty 50

## 2024-04-07 NOTE — Progress Notes (Signed)
 PROGRESS NOTE        PATIENT DETAILS Name: [name] Age: 78 y.o. Sex: female Date of Birth: 1946-04-08 Admit Date: 04/02/2024 Admitting Physician Terry LOISE Hurst, DO ERE:[email or web address], Zachary, MD  Brief Summary: Patient is a 78 y.o.  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  She was brought to the ED on 11/17-for altered mental status-was found to have acute metabolic encephalopathy in the setting of PNA.  Significant events: 11/17>> admit to [facility].  Significant studies: 11/17>> CT head: Persistent hypodensity/mild edema right frontoparietal operculum 11/17>> CXR: Left> right airspace disease-atelectasis versus infection versus aspiration. 11/18>> EEG: No seizures.  Significant microbiology data: 11/17>> COVID/influenza/RSV PCR: Negative 11/17>> blood culture: No growth 11/18>> blood culture: No growth  Procedures: None  Consults: Palliative Neuro Oncology  Subjective: Answers simple questions appropriately-per nursing staff-ate almost all of her meals yesterday.  Remains very pleasantly confused this morning.  Objective: Vitals: Blood pressure 114/79, pulse 72, temperature 97.6 F (36.4 C), temperature source Oral, resp. rate 14, SpO2 90%.   Exam: Pleasantly confused Not in any distress Abdomen: Soft nontender nondistended Unchanged left hemiparesis (LUE > LLE)   Pertinent Labs/Radiology:    Latest Ref Rng & Units 04/04/2024    3:41 AM 04/03/2024    6:04 AM 04/02/2024    4:23 PM  CBC  WBC 4.0 - 10.5 K/uL 9.0  10.0    Hemoglobin 12.0 - 15.0 g/dL 84.6  83.8  82.2   Hematocrit 36.0 -  46.0 % 47.4  49.1  52.0   Platelets 150 - 400 K/uL 237  221      Lab Results  Component Value Date   NA 135 04/07/2024   K 3.5 04/07/2024   CL 102 04/07/2024   CO2 19 (L) 04/07/2024      Assessment/Plan: Acute metabolic encephalopathy superimposed on dementia/delirium Secondary to sepsis physiology/aspiration pneumonia CT head with unchanged right frontal lobe lesion EEG negative for seizures Overall clinically improved-some waxing/waning in delirium Continue to treat underlying pneumonia Maintain delirium precautions.  Sepsis secondary to presumed aspiration pneumonia (likely related to recent dysphagia in the setting of CVA/right frontal lobe lesion) Sepsis physiology has resolved Overall improved-stop Unasyn -switch to Augmentin  times for 1 additional day.  Oropharyngeal dysphagia Likely related to recent CVA versus right frontal lobe tumor SLP following-remains on full liquids.   AKI Mild Likely hemodynamically mediated Follow electrolytes periodically Avoid nephrotoxic agents.  Hyponatremia Mild Likely of no clinical significance Follow electrolytes periodically  Seizure Likely related to underlying right frontal lobe lesion Continue Keppra  Encephalopathy/confusion felt to be related to PNA/sepsis physiology rather than breakthrough seizures Spot EEG negative. Continue to closely.  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   Thankfully with supportive care-she seems a bit more better for the past several days-palliative care following-seems that  poor for now-family wanting to give time for clinical outcomes before deciding on further.  Although clinically improved-she is frail/has dementia-and remains at risk for recurrent hospitalization/recurrent aspiration episodes. Will benefit from continued palliative care follow-up in the outpatient setting.  HTN BP stable Amlodipine   HLD Statin  Polycythemia Mild Unclear etiology Stable for periodic follow-up.  GERD PPI  Dementia/cognitive dysfunction  Aricept   Class 2 Obesity: Estimated body mass index is 35.34 kg/m as calculated from the following:   Height as of 02/26/24: 5' 4 (1.626 m).   Weight as of 03/23/24: 93.4 kg.   Code status:   Code Status: Limited: Do not attempt resuscitation (DNR) -DNR-LIMITED -Do Not Intubate/DNI    DVT Prophylaxis: enoxaparin  (LOVENOX ) injection 40 mg Start: 04/02/24 2000   Family Communication: Son-[name]-[phone] left VM-11/19   Disposition Plan: Status is: Inpatient Remains inpatient appropriate because: Severity of illness   Planned Discharge Destination:Skilled nursing facility   Diet: Diet Order             Diet full liquid Room service appropriate? Yes with Assist; Fluid consistency: Thin  Diet effective now                     Antimicrobial agents: Anti-infectives (From admission, onward)    Start     Dose/Rate Route Frequency Ordered Stop   04/04/24 0945  Ampicillin -Sulbactam (UNASYN ) 3 g in sodium chloride  0.9 % 100 mL IVPB        3 g 200 mL/hr over 30 Minutes Intravenous Every 6 hours 04/04/24 0846     04/03/24 0800  ceFEPIme  (MAXIPIME ) 2 g in sodium chloride  0.9 % 100 mL IVPB  Status:  Discontinued        2 g 200 mL/hr over 30 Minutes Intravenous Every 12 hours 04/02/24 2101 04/04/24 0820   04/03/24 0800  metroNIDAZOLE  (FLAGYL ) IVPB 500 mg  Status:  Discontinued        500 mg 100 mL/hr over 60 Minutes Intravenous 2 times daily 04/03/24 0747 04/04/24 0820   04/02/24 1930  vancomycin   (VANCOCIN ) IVPB 1000 mg/200 mL premix  Status:  Discontinued        1,000 mg 200 mL/hr over 60 Minutes Intravenous  Once 04/02/24 1921 04/02/24 1922   04/02/24 1930  ceFEPIme  (MAXIPIME ) 2 g in sodium chloride  0.9 % 100 mL IVPB        2 g 200 mL/hr over 30 Minutes Intravenous  Once 04/02/24 1921 04/02/24 2006   04/02/24 1930  vancomycin  (VANCOREADY) IVPB 2000 mg/400 mL        2,000 mg 200 mL/hr over 120 Minutes Intravenous  Once 04/02/24 1922 04/02/24 2228        MEDICATIONS: Scheduled Meds:  amLODipine   10 mg Oral Daily   donepezil   10 mg Oral Daily   enoxaparin  (LOVENOX ) injection  40 mg Subcutaneous Q24H   levETIRAcetam   500 mg Oral BID   pantoprazole   40 mg Oral Daily   prednisoLONE  acetate  1 drop Right Eye QID   rosuvastatin   20 mg Oral Daily   Continuous Infusions:  ampicillin -sulbactam (UNASYN ) IV 3 g (04/07/24 1005)   PRN Meds:.acetaminophen , LORazepam , melatonin, polyethylene glycol, prochlorperazine    I have personally reviewed following labs and imaging studies  LABORATORY DATA: CBC: Recent Labs  Lab 04/02/24 1553 04/02/24 1623 04/03/24 0604 04/04/24 0341  WBC 9.9  --  10.0 9.0  NEUTROABS 8.0*  --  7.4  --   HGB 16.7* 17.7* 16.1* 15.3*  HCT 50.7* 52.0* 49.1* 47.4*  MCV 86.7  --  88.6 88.8  PLT 263  --  221 237    Basic Metabolic Panel: Recent Labs  Lab 04/02/24 1553 04/02/24 1623 04/03/24 0604 04/04/24 0341 04/06/24 0250 04/07/24 0409  NA 132* 133* 133* 134* 133* 135  K 5.1 5.7* 3.9 4.2 3.4* 3.5  CL 98 100 96* 101 101 102  CO2 23  --  20* 15* 15* 19*  GLUCOSE 112* 109* 84 73 75 85  BUN 20 29* 19 18 22 18   CREATININE 1.57* 1.60* 1.33* 1.33* 1.22* 1.12*  CALCIUM  8.9  --  8.6* 8.4* 8.6* 8.3*  MG  --   --  2.0 2.0  --  1.9  PHOS  --   --  5.7* 4.7*  --  3.5    GFR: CrCl cannot be calculated (Unknown ideal weight.).  Liver Function Tests: Recent Labs  Lab 04/02/24 1553 04/03/24 0604 04/04/24 0341  AST 50* 38 34  ALT 13 10 10    ALKPHOS 67 62 56  BILITOT 1.8* 1.4* 1.2  PROT 7.7 7.8 7.0  ALBUMIN 3.2* 3.1* 2.7*   No results for input(s): LIPASE, AMYLASE in the last 168 hours. No results for input(s): AMMONIA in the last 168 hours.  Coagulation Profile: Recent Labs  Lab 04/02/24 1553  INR 1.1    Cardiac Enzymes: No results for input(s): CKTOTAL, CKMB, CKMBINDEX, TROPONINI in the last 168 hours.  BNP (last 3 results) No results for input(s): PROBNP in the last 8760 hours.  Lipid Profile: No results for input(s): CHOL, HDL, LDLCALC, TRIG, CHOLHDL, LDLDIRECT in the last 72 hours.  Thyroid Function Tests: No results for input(s): TSH, T4TOTAL, FREET4, T3FREE, THYROIDAB in the last 72 hours.  Anemia Panel: No results for input(s): VITAMINB12, FOLATE, FERRITIN, TIBC, IRON, RETICCTPCT in the last 72 hours.  Urine analysis:    Component Value Date/Time   COLORURINE YELLOW 04/02/2024 2030   APPEARANCEUR CLEAR 04/02/2024 2030   LABSPEC 1.018 04/02/2024 2030   PHURINE 5.0 04/02/2024 2030   GLUCOSEU NEGATIVE 04/02/2024 2030   HGBUR SMALL (A) 04/02/2024 2030   BILIRUBINUR NEGATIVE 04/02/2024 2030   BILIRUBINUR negative 01/02/2021 1225   BILIRUBINUR negative 06/03/2015 1024   KETONESUR 20 (A) 04/02/2024 2030   PROTEINUR 100 (A) 04/02/2024 2030   UROBILINOGEN 2.0 (A) 01/02/2021 1225   NITRITE NEGATIVE 04/02/2024 2030   LEUKOCYTESUR NEGATIVE 04/02/2024 2030    Sepsis Labs: Lactic Acid, Venous    Component Value Date/Time   LATICACIDVEN 1.2 04/02/2024 2255    MICROBIOLOGY: Recent Results (from the past 240 hours)  Resp panel by RT-PCR (RSV, Flu A&B, Covid) Anterior Nasal Swab     Status: None   Collection Time: 04/02/24  4:42 PM   Specimen: Anterior Nasal Swab  Result Value Ref Range Status   SARS Coronavirus 2 by RT PCR NEGATIVE NEGATIVE Final   Influenza A by PCR NEGATIVE NEGATIVE Final   Influenza B by PCR NEGATIVE NEGATIVE Final    Comment:  (NOTE) The Xpert Xpress SARS-CoV-2/FLU/RSV plus assay is intended as an aid in the diagnosis of influenza from Nasopharyngeal swab specimens and should not be used as a sole basis for treatment. Nasal washings and aspirates are unacceptable for Xpert Xpress SARS-CoV-2/FLU/RSV testing.  Fact Sheet for Patients: bloggercourse.com  Fact Sheet for Healthcare Providers: seriousbroker.it  This test is not yet approved or cleared by the [place]  FDA and has been authorized for detection and/or diagnosis of SARS-CoV-2 by FDA under an Emergency Use Authorization (EUA). This EUA will remain in effect (meaning this test can be used) for the  duration of the COVID-19 declaration under Section 564(b)(1) of the Act, 21 U.S.C. section 360bbb-3(b)(1), unless the authorization is terminated or revoked.     Resp Syncytial Virus by PCR NEGATIVE NEGATIVE Final    Comment: (NOTE) Fact Sheet for Patients: bloggercourse.com  Fact Sheet for Healthcare Providers: seriousbroker.it  This test is not yet approved or cleared by the [place]  FDA and has been authorized for detection and/or diagnosis of SARS-CoV-2 by FDA under an Emergency Use Authorization (EUA). This EUA will remain in effect (meaning this test can be used) for the duration of the COVID-19 declaration under Section 564(b)(1) of the Act, 21 U.S.C. section 360bbb-3(b)(1), unless the authorization is terminated or revoked.  Performed at [facility], [address] [place], [place] [address]   Culture, blood (Routine X 2) w Reflex to ID Panel     Status: None   Collection Time: 04/02/24 10:54 PM   Specimen: BLOOD LEFT HAND  Result Value Ref Range Status   Specimen Description BLOOD LEFT HAND  Final   Special Requests   Final    BOTTLES DRAWN AEROBIC AND ANAEROBIC Blood Culture adequate volume   Culture   Final    NO  GROWTH 5 DAYS Performed at [facility], [address] [place], [place] [address]    Report Status 04/07/2024 FINAL  Final  Culture, blood (Routine X 2) w Reflex to ID Panel     Status: None (Preliminary result)   Collection Time: 04/03/24  7:03 PM   Specimen: BLOOD  Result Value Ref Range Status   Specimen Description BLOOD SITE NOT SPECIFIED  Final   Special Requests   Final    BOTTLES DRAWN AEROBIC ONLY Blood Culture results may not be optimal due to an inadequate volume of blood received in culture bottles   Culture   Final    NO GROWTH 4 DAYS Performed at [facility], [address], [place], [place] [address]    Report Status PENDING  Incomplete    RADIOLOGY STUDIES/RESULTS: No results found.    LOS: 5 days   Donalda Applebaum, MD  [facility]    To contact the attending provider between 7A-7P or the covering provider during after hours 7P-7A, please log into the web site [email or web address] and access using universal [place] password for that web site. If you do not have the password, please call the hospital operator.  04/07/2024, 11:06 AM

## 2024-04-08 DIAGNOSIS — G939 Disorder of brain, unspecified: Secondary | ICD-10-CM | POA: Diagnosis not present

## 2024-04-08 DIAGNOSIS — Z515 Encounter for palliative care: Secondary | ICD-10-CM | POA: Diagnosis not present

## 2024-04-08 DIAGNOSIS — Z7189 Other specified counseling: Secondary | ICD-10-CM | POA: Diagnosis not present

## 2024-04-08 DIAGNOSIS — F0390 Unspecified dementia without behavioral disturbance: Secondary | ICD-10-CM | POA: Diagnosis not present

## 2024-04-08 NOTE — Progress Notes (Signed)
 PROGRESS NOTE        PATIENT DETAILS Name: [name] Age: 78 y.o. Sex: female Date of Birth: 1945/10/17 Admit Date: 04/02/2024 Admitting Physician Terry LOISE Hurst, DO ERE:[email or web address], Zachary, MD  Brief Summary: Patient is a 78 y.o.  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  She was brought to the ED on 11/17-for altered mental status-was found to have acute metabolic encephalopathy in the setting of PNA.  Significant events: 11/17>> admit to [facility].  Significant studies: 11/17>> CT head: Persistent hypodensity/mild edema right frontoparietal operculum 11/17>> CXR: Left> right airspace disease-atelectasis versus infection versus aspiration. 11/18>> EEG: No seizures.  Significant microbiology data: 11/17>> COVID/influenza/RSV PCR: Negative 11/17>> blood culture: No growth 11/18>> blood culture: No growth  Procedures: None  Consults: Palliative Neuro Oncology  Subjective: No major issues overnight-lying comfortably in bed.  Remains pleasantly confused  Objective: Vitals: Blood pressure 107/80, pulse 85, temperature 97.6 F (36.4 C), temperature source Axillary, resp. rate 14, SpO2 93%.   Exam: Awake-pleasantly confused Not in any distress Abdomen: Soft nontender and not distended Unchanged left hemiparesis (LUE > LLE) )  Pertinent Labs/Radiology:    Latest Ref Rng & Units 04/04/2024    3:41 AM 04/03/2024    6:04 AM 04/02/2024    4:23 PM  CBC  WBC 4.0 - 10.5 K/uL 9.0  10.0    Hemoglobin 12.0 - 15.0 g/dL 84.6  83.8  82.2   Hematocrit 36.0 - 46.0 % 47.4  49.1  52.0   Platelets 150 - 400  K/uL 237  221      Lab Results  Component Value Date   NA 135 04/07/2024   K 3.5 04/07/2024   CL 102 04/07/2024   CO2 19 (L) 04/07/2024      Assessment/Plan: Acute metabolic encephalopathy superimposed on dementia/delirium Secondary to sepsis physiology/aspiration pneumonia CT head with unchanged right frontal lobe lesion EEG negative for seizures Overall clinically improved-some waxing/waning in delirium Continue to treat underlying pneumonia Maintain delirium precautions.  Sepsis secondary to presumed aspiration pneumonia (likely related to recent dysphagia in the setting of CVA/right frontal lobe lesion) Sepsis physiology has resolved Overall improved-initially on Unasyn -switch to Augmentin -which she has completed. Now being monitored off antibiotics-appears stable.  Oropharyngeal dysphagia Likely related to recent CVA versus right frontal lobe tumor SLP following-remains on full liquids.   AKI Mild Likely hemodynamically mediated Follow electrolytes periodically Avoid nephrotoxic agents.  Hyponatremia Mild Likely of no clinical significance Follow electrolytes periodically  Seizure Likely related to underlying right frontal lobe lesion Continue Keppra  Encephalopathy/confusion felt to be related to PNA/sepsis physiology rather than breakthrough seizures Spot EEG negative. Continue to closely.  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   Thankfully with supportive care-she seems a bit more better for the past several days-palliative care  following-seems that poor for now-family wanting to give time for clinical outcomes before deciding on further.  Although clinically improved-she is frail/has dementia-and remains at risk for recurrent hospitalization/recurrent aspiration episodes. Will benefit from continued palliative care follow-up in the outpatient setting.  HTN BP stable Amlodipine   HLD Statin  Polycythemia Mild Unclear etiology Stable for periodic follow-up.  GERD PPI  Dementia/cognitive dysfunction  Aricept   Class 2 Obesity: Estimated body mass index is 35.34 kg/m as calculated from the following:   Height as of 02/26/24: 5' 4 (1.626 m).   Weight as of 03/23/24: 93.4 kg.   Code status:   Code Status: Limited: Do not attempt resuscitation (DNR) -DNR-LIMITED -Do Not Intubate/DNI    DVT Prophylaxis: enoxaparin  (LOVENOX ) injection 40 mg Start: 04/02/24 2000   Family Communication: Son-[name] Halsted-[phone] left VM-11/19   Disposition Plan: Status is: Inpatient Remains inpatient appropriate because: Severity of illness   Planned Discharge Destination:Skilled nursing facility   Diet: Diet Order             Diet full liquid Room service appropriate? Yes with Assist; Fluid consistency: Thin  Diet effective now                     Antimicrobial agents: Anti-infectives (From admission, onward)    Start     Dose/Rate Route Frequency Ordered Stop   04/07/24 1200  amoxicillin -clavulanate (AUGMENTIN ) 400-57 MG/5ML suspension 800 mg        800 mg Oral Every 12 hours 04/07/24 1108 04/07/24 2135   04/04/24 0945  Ampicillin -Sulbactam (UNASYN ) 3 g in sodium chloride  0.9 % 100 mL IVPB  Status:  Discontinued        3 g 200 mL/hr over 30 Minutes Intravenous Every 6 hours 04/04/24 0846 04/07/24 1107   04/03/24 0800  ceFEPIme  (MAXIPIME ) 2 g in sodium chloride  0.9 % 100 mL IVPB  Status:  Discontinued        2 g 200 mL/hr over 30 Minutes Intravenous Every 12 hours 04/02/24 2101 04/04/24 0820    04/03/24 0800  metroNIDAZOLE  (FLAGYL ) IVPB 500 mg  Status:  Discontinued        500 mg 100 mL/hr over 60 Minutes Intravenous 2 times daily 04/03/24 0747 04/04/24 0820   04/02/24 1930  vancomycin  (VANCOCIN ) IVPB 1000 mg/200 mL premix  Status:  Discontinued        1,000 mg 200 mL/hr over 60 Minutes Intravenous  Once 04/02/24 1921 04/02/24 1922   04/02/24 1930  ceFEPIme  (MAXIPIME ) 2 g in sodium chloride  0.9 % 100 mL IVPB        2 g 200 mL/hr over 30 Minutes Intravenous  Once 04/02/24 1921 04/02/24 2006   04/02/24 1930  vancomycin  (VANCOREADY) IVPB 2000 mg/400 mL        2,000 mg 200 mL/hr over 120 Minutes Intravenous  Once 04/02/24 1922 04/02/24 2228        MEDICATIONS: Scheduled Meds:  amLODipine   10 mg Oral Daily   donepezil   10 mg Oral Daily   enoxaparin  (LOVENOX ) injection  40 mg Subcutaneous Q24H   levETIRAcetam   500 mg Oral BID   pantoprazole   40 mg Oral Daily   prednisoLONE  acetate  1 drop Right Eye QID   rosuvastatin   20 mg Oral Daily   Continuous Infusions:   PRN Meds:.acetaminophen , LORazepam , melatonin, polyethylene glycol, prochlorperazine    I have personally reviewed following labs and imaging studies  LABORATORY DATA: CBC: Recent Labs  Lab 04/02/24 1553 04/02/24 1623 04/03/24 0604 04/04/24 0341  WBC 9.9  --  10.0 9.0  NEUTROABS 8.0*  --  7.4  --  HGB 16.7* 17.7* 16.1* 15.3*  HCT 50.7* 52.0* 49.1* 47.4*  MCV 86.7  --  88.6 88.8  PLT 263  --  221 237    Basic Metabolic Panel: Recent Labs  Lab 04/02/24 1553 04/02/24 1623 04/03/24 0604 04/04/24 0341 04/06/24 0250 04/07/24 0409  NA 132* 133* 133* 134* 133* 135  K 5.1 5.7* 3.9 4.2 3.4* 3.5  CL 98 100 96* 101 101 102  CO2 23  --  20* 15* 15* 19*  GLUCOSE 112* 109* 84 73 75 85  BUN 20 29* 19 18 22 18   CREATININE 1.57* 1.60* 1.33* 1.33* 1.22* 1.12*  CALCIUM  8.9  --  8.6* 8.4* 8.6* 8.3*  MG  --   --  2.0 2.0  --  1.9  PHOS  --   --  5.7* 4.7*  --  3.5    GFR: CrCl cannot be calculated (Unknown  ideal weight.).  Liver Function Tests: Recent Labs  Lab 04/02/24 1553 04/03/24 0604 04/04/24 0341  AST 50* 38 34  ALT 13 10 10   ALKPHOS 67 62 56  BILITOT 1.8* 1.4* 1.2  PROT 7.7 7.8 7.0  ALBUMIN 3.2* 3.1* 2.7*   No results for input(s): LIPASE, AMYLASE in the last 168 hours. No results for input(s): AMMONIA in the last 168 hours.  Coagulation Profile: Recent Labs  Lab 04/02/24 1553  INR 1.1    Cardiac Enzymes: No results for input(s): CKTOTAL, CKMB, CKMBINDEX, TROPONINI in the last 168 hours.  BNP (last 3 results) No results for input(s): PROBNP in the last 8760 hours.  Lipid Profile: No results for input(s): CHOL, HDL, LDLCALC, TRIG, CHOLHDL, LDLDIRECT in the last 72 hours.  Thyroid Function Tests: No results for input(s): TSH, T4TOTAL, FREET4, T3FREE, THYROIDAB in the last 72 hours.  Anemia Panel: No results for input(s): VITAMINB12, FOLATE, FERRITIN, TIBC, IRON, RETICCTPCT in the last 72 hours.  Urine analysis:    Component Value Date/Time   COLORURINE YELLOW 04/02/2024 2030   APPEARANCEUR CLEAR 04/02/2024 2030   LABSPEC 1.018 04/02/2024 2030   PHURINE 5.0 04/02/2024 2030   GLUCOSEU NEGATIVE 04/02/2024 2030   HGBUR SMALL (A) 04/02/2024 2030   BILIRUBINUR NEGATIVE 04/02/2024 2030   BILIRUBINUR negative 01/02/2021 1225   BILIRUBINUR negative 06/03/2015 1024   KETONESUR 20 (A) 04/02/2024 2030   PROTEINUR 100 (A) 04/02/2024 2030   UROBILINOGEN 2.0 (A) 01/02/2021 1225   NITRITE NEGATIVE 04/02/2024 2030   LEUKOCYTESUR NEGATIVE 04/02/2024 2030    Sepsis Labs: Lactic Acid, Venous    Component Value Date/Time   LATICACIDVEN 1.2 04/02/2024 2255    MICROBIOLOGY: Recent Results (from the past 240 hours)  Resp panel by RT-PCR (RSV, Flu A&B, Covid) Anterior Nasal Swab     Status: None   Collection Time: 04/02/24  4:42 PM   Specimen: Anterior Nasal Swab  Result Value Ref Range Status   SARS Coronavirus 2 by  RT PCR NEGATIVE NEGATIVE Final   Influenza A by PCR NEGATIVE NEGATIVE Final   Influenza B by PCR NEGATIVE NEGATIVE Final    Comment: (NOTE) The Xpert Xpress SARS-CoV-2/FLU/RSV plus assay is intended as an aid in the diagnosis of influenza from Nasopharyngeal swab specimens and should not be used as a sole basis for treatment. Nasal washings and aspirates are unacceptable for Xpert Xpress SARS-CoV-2/FLU/RSV testing.  Fact Sheet for Patients: bloggercourse.com  Fact Sheet for Healthcare Providers: seriousbroker.it  This test is not yet approved or cleared by the [place]  FDA and has been authorized for detection and/or  diagnosis of SARS-CoV-2 by FDA under an Emergency Use Authorization (EUA). This EUA will remain in effect (meaning this test can be used) for the duration of the COVID-19 declaration under Section 564(b)(1) of the Act, 21 U.S.C. section 360bbb-3(b)(1), unless the authorization is terminated or revoked.     Resp Syncytial Virus by PCR NEGATIVE NEGATIVE Final    Comment: (NOTE) Fact Sheet for Patients: bloggercourse.com  Fact Sheet for Healthcare Providers: seriousbroker.it  This test is not yet approved or cleared by the [place]  FDA and has been authorized for detection and/or diagnosis of SARS-CoV-2 by FDA under an Emergency Use Authorization (EUA). This EUA will remain in effect (meaning this test can be used) for the duration of the COVID-19 declaration under Section 564(b)(1) of the Act, 21 U.S.C. section 360bbb-3(b)(1), unless the authorization is terminated or revoked.  Performed at [facility], [address], [place], [place] [address]   Culture, blood (Routine X 2) w Reflex to ID Panel     Status: None   Collection Time: 04/02/24 10:54 PM   Specimen: BLOOD LEFT HAND  Result Value Ref Range Status   Specimen Description BLOOD LEFT HAND   Final   Special Requests   Final    BOTTLES DRAWN AEROBIC AND ANAEROBIC Blood Culture adequate volume   Culture   Final    NO GROWTH 5 DAYS Performed at [facility], [address], [place], [place] [address]    Report Status 04/07/2024 FINAL  Final  Culture, blood (Routine X 2) w Reflex to ID Panel     Status: None (Preliminary result)   Collection Time: 04/03/24  7:03 PM   Specimen: BLOOD  Result Value Ref Range Status   Specimen Description BLOOD SITE NOT SPECIFIED  Final   Special Requests   Final    BOTTLES DRAWN AEROBIC ONLY Blood Culture results may not be optimal due to an inadequate volume of blood received in culture bottles   Culture   Final    NO GROWTH 4 DAYS Performed at [facility], [address] [place], [place] [address]    Report Status PENDING  Incomplete    RADIOLOGY STUDIES/RESULTS: No results found.    LOS: 6 days   Donalda Applebaum, MD  [facility]    To contact the attending provider between 7A-7P or the covering provider during after hours 7P-7A, please log into the web site [email or web address] and access using universal [place] password for that web site. If you do not have the password, please call the hospital operator.  04/08/2024, 11:02 AM

## 2024-04-08 NOTE — Plan of Care (Signed)

## 2024-04-09 ENCOUNTER — Inpatient Hospital Stay (HOSPITAL_COMMUNITY)

## 2024-04-09 DIAGNOSIS — Z515 Encounter for palliative care: Secondary | ICD-10-CM | POA: Diagnosis not present

## 2024-04-09 DIAGNOSIS — F0390 Unspecified dementia without behavioral disturbance: Secondary | ICD-10-CM | POA: Diagnosis not present

## 2024-04-09 DIAGNOSIS — G939 Disorder of brain, unspecified: Secondary | ICD-10-CM | POA: Diagnosis not present

## 2024-04-09 DIAGNOSIS — Z7189 Other specified counseling: Secondary | ICD-10-CM | POA: Diagnosis not present

## 2024-04-09 LAB — CULTURE, BLOOD (ROUTINE X 2): Culture: NO GROWTH

## 2024-04-09 LAB — CREATININE, SERUM
Creatinine, Ser: 1.14 mg/dL — ABNORMAL HIGH (ref 0.44–1.00)
GFR, Estimated: 49 mL/min — ABNORMAL LOW (ref >60)

## 2024-04-09 LAB — GLUCOSE, CAPILLARY: Glucose-Capillary: 108 mg/dL — ABNORMAL HIGH (ref 70–99)

## 2024-04-09 NOTE — Progress Notes (Signed)
 SLP Cancellation Note  Patient Details Name: [name] MRN: [id number] DOB: 12/21/1945   Cancelled treatment:       Reason Eval/Treat Not Completed: Fatigue/lethargy limiting ability to participate. Brought pt down to radiology for MBS but she could not be roused for sustained periods to test POs. Continue current diet with full supervision for use of aspiration precautions. Will consider diet advancement and repeat MBS once pt is more consistently alert.    Damien Blumenthal, M.A., CCC-SLP Speech Language Pathology, Acute Rehabilitation Services  Secure Chat preferred [phone]  04/09/2024, 2:32 PM

## 2024-04-09 NOTE — Plan of Care (Signed)
   Problem: Education: Goal: Knowledge of General Education information will improve Description: Including pain rating scale, medication(s)/side effects and non-pharmacologic comfort measures Outcome: Progressing   Problem: Nutrition: Goal: Adequate nutrition will be maintained Outcome: Progressing   Problem: Skin Integrity: Goal: Risk for impaired skin integrity will decrease Outcome: Progressing

## 2024-04-09 NOTE — Progress Notes (Addendum)
 PROGRESS NOTE        PATIENT DETAILS Name: [name] Age: 78 y.o. Sex: female Date of Birth: August 01, 1945 Admit Date: 04/02/2024 Admitting Physician Terry LOISE Hurst, DO ERE:Xpoejumprx, Zachary, MD  Brief Summary: Patient is a 78 y.o.  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  She was brought to the ED on 11/17-for altered mental status-was found to have acute metabolic encephalopathy in the setting of PNA.  Significant events: 11/17>> admit to [facility].  Significant studies: 11/17>> CT head: Persistent hypodensity/mild edema right frontoparietal operculum 11/17>> CXR: Left> right airspace disease-atelectasis versus infection versus aspiration. 11/18>> EEG: No seizures.  Significant microbiology data: 11/17>> COVID/influenza/RSV PCR: Negative 11/17>> blood culture: No growth 11/18>> blood culture: No growth  Procedures: None  Consults: Palliative Neuro Oncology  Subjective: No major issues overnight-tolerating full liquid diet without any major issues.  Objective: Vitals: Blood pressure 118/73, pulse 80, temperature 98.4 F (36.9 C), temperature source Axillary, resp. rate 14, SpO2 90%.   Exam: Pleasantly confused-but able to answer simple questions Abdomen: Soft nontender nondistended Unchanged left hemiparesis (LUE > LLE)   Pertinent Labs/Radiology:    Latest Ref Rng & Units 04/04/2024    3:41 AM 04/03/2024    6:04 AM 04/02/2024    4:23 PM  CBC  WBC 4.0 - 10.5 K/uL 9.0  10.0    Hemoglobin 12.0 - 15.0 g/dL 84.6  83.8  82.2   Hematocrit 36.0 - 46.0 % 47.4  49.1  52.0   Platelets 150 - 400  K/uL 237  221      Lab Results  Component Value Date   NA 135 04/07/2024   K 3.5 04/07/2024   CL 102 04/07/2024   CO2 19 (L) 04/07/2024      Assessment/Plan: Acute metabolic encephalopathy superimposed on dementia/delirium Secondary to sepsis physiology/aspiration pneumonia CT head with unchanged right frontal lobe lesion EEG negative for seizures Overall much improved-after treatment of underlying pneumonia-some mild waxing/waning delirium but stable for the past several days. Maintain delirium precautions.  Sepsis secondary to presumed aspiration pneumonia (likely related to recent dysphagia in the setting of CVA/right frontal lobe lesion) Sepsis physiology has resolved-clinically improved Initially on Unasyn -and then switched to Augmentin -currently off all antimicrobial therapy SLP following-with plans for possible MBS later today.    Oropharyngeal dysphagia Likely related to recent CVA versus right frontal lobe tumor SLP following-seems to be tolerating full liquids without any issues per nursing staff-discussed with SLP service today-MBS being tentatively planned for later this afternoon.  AKI Mild Likely hemodynamically mediated Follow electrolytes periodically Avoid nephrotoxic agents.  Hyponatremia Mild Likely of no clinical significance Follow electrolytes periodically  Seizure Likely related to underlying right frontal lobe lesion Continue Keppra  Encephalopathy/confusion felt to be related to PNA/sepsis physiology rather than breakthrough seizures Spot EEG negative. Continue to closely.  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 Appreciate neuro-oncology input-per Dr.  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   Thankfully with  supportive care-she seems a bit more better for the past several days-palliative care following-seems that poor for now-family wanting to give time for clinical outcomes before deciding on further.  Although clinically improved-she is frail/has dementia-and remains at risk for recurrent hospitalization/recurrent aspiration episodes. Will benefit from continued palliative care follow-up in the outpatient setting.  HTN BP stable Amlodipine   HLD Statin  Polycythemia Mild Unclear etiology Stable for periodic follow-up.  GERD PPI  Dementia/cognitive dysfunction  Aricept   Class 2 Obesity: Estimated body mass index is 35.34 kg/m as calculated from the following:   Height as of 02/26/24: 5' 4 (1.626 m).   Weight as of 03/23/24: 93.4 kg.   Code status:   Code Status: Limited: Do not attempt resuscitation (DNR) -DNR-LIMITED -Do Not Intubate/DNI    DVT Prophylaxis: enoxaparin  (LOVENOX ) injection 40 mg Start: 04/02/24 2000   Family Communication: Son-[name] Lau-[phone] left VM-11/24   Disposition Plan: Status is: Inpatient Remains inpatient appropriate because: Severity of illness   Planned Discharge Destination:Skilled nursing facility likely on 11/25-has MBS planned for later this afternoon.  Diet: Diet Order             Diet full liquid Room service appropriate? Yes with Assist; Fluid consistency: Thin  Diet effective now                     Antimicrobial agents: Anti-infectives (From admission, onward)    Start     Dose/Rate Route Frequency Ordered Stop   04/07/24 1200  amoxicillin -clavulanate (AUGMENTIN ) 400-57 MG/5ML suspension 800 mg        800 mg Oral Every 12 hours 04/07/24 1108 04/07/24 2135   04/04/24 0945  Ampicillin -Sulbactam (UNASYN ) 3 g in sodium chloride  0.9 % 100 mL IVPB  Status:  Discontinued        3 g 200 mL/hr over 30 Minutes  Intravenous Every 6 hours 04/04/24 0846 04/07/24 1107   04/03/24 0800  ceFEPIme  (MAXIPIME ) 2 g in sodium chloride  0.9 % 100 mL IVPB  Status:  Discontinued        2 g 200 mL/hr over 30 Minutes Intravenous Every 12 hours 04/02/24 2101 04/04/24 0820   04/03/24 0800  metroNIDAZOLE  (FLAGYL ) IVPB 500 mg  Status:  Discontinued        500 mg 100 mL/hr over 60 Minutes Intravenous 2 times daily 04/03/24 0747 04/04/24 0820   04/02/24 1930  vancomycin  (VANCOCIN ) IVPB 1000 mg/200 mL premix  Status:  Discontinued        1,000 mg 200 mL/hr over 60 Minutes Intravenous  Once 04/02/24 1921 04/02/24 1922   04/02/24 1930  ceFEPIme  (MAXIPIME ) 2 g in sodium chloride  0.9 % 100 mL IVPB        2 g 200 mL/hr over 30 Minutes Intravenous  Once 04/02/24 1921 04/02/24 2006   04/02/24 1930  vancomycin  (VANCOREADY) IVPB 2000 mg/400 mL        2,000 mg 200 mL/hr over 120 Minutes Intravenous  Once 04/02/24 1922 04/02/24 2228        MEDICATIONS: Scheduled Meds:  amLODipine   10 mg Oral Daily   donepezil   10 mg Oral Daily   enoxaparin  (LOVENOX ) injection  40 mg Subcutaneous Q24H   levETIRAcetam   500 mg Oral BID   pantoprazole   40 mg Oral Daily   prednisoLONE  acetate  1 drop Right Eye QID   rosuvastatin   20 mg Oral Daily   Continuous Infusions:   PRN Meds:.acetaminophen , LORazepam , melatonin, polyethylene glycol, prochlorperazine    I have personally  reviewed following labs and imaging studies  LABORATORY DATA: CBC: Recent Labs  Lab 04/02/24 1553 04/02/24 1623 04/03/24 0604 04/04/24 0341  WBC 9.9  --  10.0 9.0  NEUTROABS 8.0*  --  7.4  --   HGB 16.7* 17.7* 16.1* 15.3*  HCT 50.7* 52.0* 49.1* 47.4*  MCV 86.7  --  88.6 88.8  PLT 263  --  221 237    Basic Metabolic Panel: Recent Labs  Lab 04/02/24 1553 04/02/24 1623 04/03/24 0604 04/04/24 0341 04/06/24 0250 04/07/24 0409 04/09/24 0602  NA 132* 133* 133* 134* 133* 135  --   K 5.1 5.7* 3.9 4.2 3.4* 3.5  --   CL 98 100 96* 101 101 102  --   CO2  23  --  20* 15* 15* 19*  --   GLUCOSE 112* 109* 84 73 75 85  --   BUN 20 29* 19 18 22 18   --   CREATININE 1.57* 1.60* 1.33* 1.33* 1.22* 1.12* 1.14*  CALCIUM  8.9  --  8.6* 8.4* 8.6* 8.3*  --   MG  --   --  2.0 2.0  --  1.9  --   PHOS  --   --  5.7* 4.7*  --  3.5  --     GFR: CrCl cannot be calculated (Unknown ideal weight.).  Liver Function Tests: Recent Labs  Lab 04/02/24 1553 04/03/24 0604 04/04/24 0341  AST 50* 38 34  ALT 13 10 10   ALKPHOS 67 62 56  BILITOT 1.8* 1.4* 1.2  PROT 7.7 7.8 7.0  ALBUMIN 3.2* 3.1* 2.7*   No results for input(s): LIPASE, AMYLASE in the last 168 hours. No results for input(s): AMMONIA in the last 168 hours.  Coagulation Profile: Recent Labs  Lab 04/02/24 1553  INR 1.1    Cardiac Enzymes: No results for input(s): CKTOTAL, CKMB, CKMBINDEX, TROPONINI in the last 168 hours.  BNP (last 3 results) No results for input(s): PROBNP in the last 8760 hours.  Lipid Profile: No results for input(s): CHOL, HDL, LDLCALC, TRIG, CHOLHDL, LDLDIRECT in the last 72 hours.  Thyroid Function Tests: No results for input(s): TSH, T4TOTAL, FREET4, T3FREE, THYROIDAB in the last 72 hours.  Anemia Panel: No results for input(s): VITAMINB12, FOLATE, FERRITIN, TIBC, IRON, RETICCTPCT in the last 72 hours.  Urine analysis:    Component Value Date/Time   COLORURINE YELLOW 04/02/2024 2030   APPEARANCEUR CLEAR 04/02/2024 2030   LABSPEC 1.018 04/02/2024 2030   PHURINE 5.0 04/02/2024 2030   GLUCOSEU NEGATIVE 04/02/2024 2030   HGBUR SMALL (A) 04/02/2024 2030   BILIRUBINUR NEGATIVE 04/02/2024 2030   BILIRUBINUR negative 01/02/2021 1225   BILIRUBINUR negative 06/03/2015 1024   KETONESUR 20 (A) 04/02/2024 2030   PROTEINUR 100 (A) 04/02/2024 2030   UROBILINOGEN 2.0 (A) 01/02/2021 1225   NITRITE NEGATIVE 04/02/2024 2030   LEUKOCYTESUR NEGATIVE 04/02/2024 2030    Sepsis Labs: Lactic Acid, Venous    Component  Value Date/Time   LATICACIDVEN 1.2 04/02/2024 2255    MICROBIOLOGY: Recent Results (from the past 240 hours)  Resp panel by RT-PCR (RSV, Flu A&B, Covid) Anterior Nasal Swab     Status: None   Collection Time: 04/02/24  4:42 PM   Specimen: Anterior Nasal Swab  Result Value Ref Range Status   SARS Coronavirus 2 by RT PCR NEGATIVE NEGATIVE Final   Influenza A by PCR NEGATIVE NEGATIVE Final   Influenza B by PCR NEGATIVE NEGATIVE Final    Comment: (NOTE) The Xpert Xpress SARS-CoV-2/FLU/RSV plus  assay is intended as an aid in the diagnosis of influenza from Nasopharyngeal swab specimens and should not be used as a sole basis for treatment. Nasal washings and aspirates are unacceptable for Xpert Xpress SARS-CoV-2/FLU/RSV testing.  Fact Sheet for Patients: bloggercourse.com  Fact Sheet for Healthcare Providers: seriousbroker.it  This test is not yet approved or cleared by the [place]  FDA and has been authorized for detection and/or diagnosis of SARS-CoV-2 by FDA under an Emergency Use Authorization (EUA). This EUA will remain in effect (meaning this test can be used) for the duration of the COVID-19 declaration under Section 564(b)(1) of the Act, 21 U.S.C. section 360bbb-3(b)(1), unless the authorization is terminated or revoked.     Resp Syncytial Virus by PCR NEGATIVE NEGATIVE Final    Comment: (NOTE) Fact Sheet for Patients: bloggercourse.com  Fact Sheet for Healthcare Providers: seriousbroker.it  This test is not yet approved or cleared by the [place]  FDA and has been authorized for detection and/or diagnosis of SARS-CoV-2 by FDA under an Emergency Use Authorization (EUA). This EUA will remain in effect (meaning this test can be used) for the duration of the COVID-19 declaration under Section 564(b)(1) of the Act, 21 U.S.C. section 360bbb-3(b)(1), unless the  authorization is terminated or revoked.  Performed at [facility], [address] [place], [place] [address]   Culture, blood (Routine X 2) w Reflex to ID Panel     Status: None   Collection Time: 04/02/24 10:54 PM   Specimen: BLOOD LEFT HAND  Result Value Ref Range Status   Specimen Description BLOOD LEFT HAND  Final   Special Requests   Final    BOTTLES DRAWN AEROBIC AND ANAEROBIC Blood Culture adequate volume   Culture   Final    NO GROWTH 5 DAYS Performed at [facility], [address], [place], [place] [address]    Report Status 04/07/2024 FINAL  Final  Culture, blood (Routine X 2) w Reflex to ID Panel     Status: None   Collection Time: 04/03/24  7:03 PM   Specimen: BLOOD  Result Value Ref Range Status   Specimen Description BLOOD SITE NOT SPECIFIED  Final   Special Requests   Final    BOTTLES DRAWN AEROBIC ONLY Blood Culture results may not be optimal due to an inadequate volume of blood received in culture bottles   Culture   Final    NO GROWTH 6 DAYS Performed at [facility], [address] [place], [place] [address]    Report Status 04/09/2024 FINAL  Final    RADIOLOGY STUDIES/RESULTS: No results found.    LOS: 7 days   Donalda Applebaum, MD  [facility]    To contact the attending provider between 7A-7P or the covering provider during after hours 7P-7A, please log into the web site [email or web address] and access using universal [place] password for that web site. If you do not have the password, please call the hospital operator.  04/09/2024, 9:48 AM

## 2024-04-09 NOTE — TOC Progression Note (Signed)
 Transition of Care [facility]) - Progression Note    Patient Details  Name: [name] MRN: [id number] Date of Birth: 1945/08/16  Transition of Care [facility]) CM/SW Contact  Inocente GORMAN Kindle, LCSW Phone Number: 04/09/2024, 9:40 AM  Clinical Narrative:    CSW continuing to follow for medical stability.    Expected Discharge Plan: Skilled Nursing Facility Barriers to Discharge: Continued Medical Work up               Expected Discharge Plan and Services In-house Referral: Clinical Social Work   Post Acute Care Choice: Skilled Nursing Facility Living arrangements for the past 2 months: Skilled Nursing Facility, Apartment                                       Social Drivers of Health (SDOH) Interventions SDOH Screenings   Food Insecurity: No Food Insecurity (04/05/2024)  Housing: Low Risk  (04/05/2024)  Transportation Needs: No Transportation Needs (04/05/2024)  Utilities: Not At Risk (04/05/2024)  Social Connections: Moderately Integrated (04/05/2024)  Tobacco Use: Low Risk  (08/11/2023)    Readmission Risk Interventions     No data to display

## 2024-04-09 NOTE — Plan of Care (Signed)
   Problem: Clinical Measurements: Goal: Ability to maintain clinical measurements within normal limits will improve Outcome: Progressing Goal: Will remain free from infection Outcome: Progressing   Problem: Nutrition: Goal: Adequate nutrition will be maintained Outcome: Progressing

## 2024-04-09 NOTE — Progress Notes (Signed)
 Occupational Therapy Treatment Patient Details Name: [name] MRN: [id number] DOB: 07-09-1945 Today's Date: 04/09/2024   History of present illness 78 y.o. female presents to [facility] 04/02/24 with AMS and somnolence. Found to have sepsis due to PNA. 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 Recently d/c from hospital to SNF on 11/11. 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   OT comments  Very lethargic, difficult to keep alert during supine grooming.  Total A for bedlevel grooming, no initiation to attempt self feeding despite stating she was hungry.  Ultimately a feeder this session.  OT can continue efforts in the acute setting, and Patient will benefit from continued inpatient follow up therapy, <3 hours/day.      If plan is discharge home, recommend the following:  A lot of help with bathing/dressing/bathroom;Two people to help with walking and/or transfers;Assistance with feeding;Direct supervision/assist for medications management;Help with stairs or ramp for entrance;Assist for transportation;Direct supervision/assist for financial management   Equipment Recommendations  Wheelchair (measurements OT);Wheelchair cushion (measurements OT);Hospital bed;Hoyer lift    Recommendations for Other Services      Precautions / Restrictions Precautions Precautions: Fall Recall of Precautions/Restrictions: Impaired Restrictions Weight Bearing Restrictions Per Provider Order: No       Mobility Bed Mobility Overal bed mobility: Needs Assistance Bed Mobility: Rolling Rolling: Total assist              Transfers                   General transfer comment: Deferred due to level of arousal     Balance                                            ADL either performed or assessed with clinical judgement   ADL   Eating/Feeding: Total assistance;Bed level   Grooming: Total assistance;Bed level                                      Extremity/Trunk Assessment Upper Extremity Assessment RUE Deficits / Details: Moves both UEs spontaneously to bring to face--but not always on command,  able to hold RUE up for ~5 seconds when placed for her LUE Deficits / Details: Moves both UEs spontaneously to bring to face--but not always on command, not able to hold RUE up when placed for her   Lower Extremity Assessment Lower Extremity Assessment: Defer to PT evaluation        Vision   Vision Assessment?: No apparent visual deficits   Perception Perception Perception: Not tested   Praxis Praxis Praxis: Not tested   Communication Communication Communication: Impaired Factors Affecting Communication: Reduced clarity of speech;Difficulty expressing self   Cognition Arousal: Lethargic Behavior During Therapy: Flat affect Cognition: History of cognitive impairments                               Following commands: Impaired Following commands impaired: Follows one step commands with increased time      Cueing   Cueing Techniques: Verbal cues, Tactile cues  Exercises      Shoulder Instructions       General Comments      Pertinent Vitals/ Pain       Pain Assessment Pain Assessment: Faces Faces Pain Scale: Hurts little more Pain Location: LLE with movement Pain Descriptors / Indicators: Grimacing, Guarding, Moaning, Discomfort Pain Intervention(s): Monitored during session                                                          Frequency  Min 2X/week        Progress Toward Goals  OT Goals(current goals can now be found in the care plan section)  Progress towards OT goals: Not progressing toward goals -  comment  Acute Rehab OT Goals OT Goal Formulation: With patient Time For Goal Achievement: 04/17/24 Potential to Achieve Goals: Fair  Plan      Co-evaluation                 AM-PAC OT 6 Clicks Daily Activity     Outcome Measure   Help from another person eating meals?: Total Help from another person taking care of personal grooming?: Total Help from another person toileting, which includes using toliet, bedpan, or urinal?: Total Help from another person bathing (including washing, rinsing, drying)?: Total Help from another person to put on and taking off regular upper body clothing?: Total Help from another person to put on and taking off regular lower body clothing?: Total 6 Click Score: 6    End of Session    OT Visit Diagnosis: Other abnormalities of gait and mobility (R26.89);Muscle weakness (generalized) (M62.81);Other symptoms and signs involving the nervous system (R29.898);Other symptoms and signs involving cognitive function Hemiplegia - Right/Left: Left Hemiplegia - dominant/non-dominant: Non-Dominant   Activity Tolerance Patient limited by lethargy   Patient Left in bed;with call bell/phone within reach;with family/visitor present   Nurse Communication Mobility status        Time: 8988-8973 OT Time Calculation (min): 15 min  Charges: OT General Charges $OT Visit: 1 Visit OT Treatments $Self Care/Home Management : 8-22 mins  04/09/2024  RP, OTR/L  Acute Rehabilitation Services  Office:  [phone]   [name] 04/09/2024, 10:29 AM

## 2024-04-10 ENCOUNTER — Inpatient Hospital Stay (HOSPITAL_COMMUNITY)

## 2024-04-10 DIAGNOSIS — R4182 Altered mental status, unspecified: Secondary | ICD-10-CM | POA: Diagnosis not present

## 2024-04-10 DIAGNOSIS — Z8673 Personal history of transient ischemic attack (TIA), and cerebral infarction without residual deficits: Secondary | ICD-10-CM | POA: Diagnosis not present

## 2024-04-10 DIAGNOSIS — R569 Unspecified convulsions: Secondary | ICD-10-CM | POA: Diagnosis not present

## 2024-04-10 DIAGNOSIS — J189 Pneumonia, unspecified organism: Secondary | ICD-10-CM | POA: Diagnosis not present

## 2024-04-10 LAB — BLOOD GAS, ARTERIAL
Acid-base deficit: 6 mmol/L — ABNORMAL HIGH (ref 0.0–2.0)
Bicarbonate: 16.9 mmol/L — ABNORMAL LOW (ref 20.0–28.0)
O2 Saturation: 95.3 %
Patient temperature: 37.1
pCO2 arterial: 26 mmHg — ABNORMAL LOW (ref 32–48)
pH, Arterial: 7.42 (ref 7.35–7.45)
pO2, Arterial: 72 mmHg — ABNORMAL LOW (ref 83–108)

## 2024-04-10 LAB — COMPREHENSIVE METABOLIC PANEL WITH GFR
ALT: 10 U/L (ref 0–44)
AST: 55 U/L — ABNORMAL HIGH (ref 15–41)
Albumin: 3 g/dL — ABNORMAL LOW (ref 3.5–5.0)
Alkaline Phosphatase: 53 U/L (ref 38–126)
Anion gap: 19 — ABNORMAL HIGH (ref 5–15)
BUN: 18 mg/dL (ref 8–23)
CO2: 15 mmol/L — ABNORMAL LOW (ref 22–32)
Calcium: 9 mg/dL (ref 8.9–10.3)
Chloride: 100 mmol/L (ref 98–111)
Creatinine, Ser: 1.36 mg/dL — ABNORMAL HIGH (ref 0.44–1.00)
GFR, Estimated: 39 mL/min — ABNORMAL LOW (ref >60)
Glucose, Bld: 88 mg/dL (ref 70–99)
Potassium: 5 mmol/L (ref 3.5–5.1)
Sodium: 134 mmol/L — ABNORMAL LOW (ref 135–145)
Total Bilirubin: 1.8 mg/dL — ABNORMAL HIGH (ref 0.0–1.2)
Total Protein: 7.8 g/dL (ref 6.5–8.1)

## 2024-04-10 LAB — CBC
HCT: 51 % — ABNORMAL HIGH (ref 36.0–46.0)
Hemoglobin: 16.3 g/dL — ABNORMAL HIGH (ref 12.0–15.0)
MCH: 28.1 pg (ref 26.0–34.0)
MCHC: 32 g/dL (ref 30.0–36.0)
MCV: 87.8 fL (ref 80.0–100.0)
Platelets: 320 K/uL (ref 150–400)
RBC: 5.81 MIL/uL — ABNORMAL HIGH (ref 3.87–5.11)
RDW: 15.2 % (ref 11.5–15.5)
WBC: 9.4 K/uL (ref 4.0–10.5)
nRBC: 0 % (ref 0.0–0.2)

## 2024-04-10 LAB — AMMONIA: Ammonia: 26 umol/L (ref 9–35)

## 2024-04-10 LAB — MRSA NEXT GEN BY PCR, NASAL: MRSA by PCR Next Gen: NOT DETECTED

## 2024-04-10 LAB — GLUCOSE, CAPILLARY: Glucose-Capillary: 107 mg/dL — ABNORMAL HIGH (ref 70–99)

## 2024-04-10 MED ORDER — LEVETIRACETAM (KEPPRA) 500 MG/5 ML ADULT IV PUSH: 4500.0000 mg | Freq: Once | INTRAVENOUS | Status: DC

## 2024-04-10 MED ORDER — SODIUM CHLORIDE 0.9 % IV SOLN: INTRAVENOUS | Status: AC

## 2024-04-10 MED ORDER — PIPERACILLIN-TAZOBACTAM 3.375 G IVPB
3.3750 g | Freq: Three times a day (TID) | INTRAVENOUS | Status: DC
  Administered 2024-04-10 – 2024-04-13 (×8): 3.375 g via INTRAVENOUS
  Filled 2024-04-10 (×8): qty 50

## 2024-04-10 MED ORDER — ACETAMINOPHEN 650 MG RE SUPP: 650.0000 mg | Freq: Four times a day (QID) | RECTAL | Status: DC | PRN

## 2024-04-10 MED ORDER — ACETAMINOPHEN 650 MG RE SUPP
RECTAL | Status: AC
  Administered 2024-04-10: 650 mg via RECTAL
  Filled 2024-04-10: qty 1

## 2024-04-10 MED ORDER — PANTOPRAZOLE SODIUM 40 MG IV SOLR
40.0000 mg | INTRAVENOUS | Status: DC
  Administered 2024-04-10 – 2024-04-11 (×2): 40 mg via INTRAVENOUS
  Filled 2024-04-10 (×3): qty 10

## 2024-04-10 MED ORDER — LEVETIRACETAM (KEPPRA) 500 MG/5 ML ADULT IV PUSH
500.0000 mg | Freq: Two times a day (BID) | INTRAVENOUS | Status: DC
  Administered 2024-04-10 – 2024-04-13 (×8): 500 mg via INTRAVENOUS
  Filled 2024-04-10 (×8): qty 5

## 2024-04-10 MED ORDER — LEVETIRACETAM (KEPPRA) 500 MG/5 ML ADULT IV PUSH
2000.0000 mg | Freq: Once | INTRAVENOUS | Status: AC
  Administered 2024-04-10: 2000 mg via INTRAVENOUS
  Filled 2024-04-10: qty 20

## 2024-04-10 MED ORDER — HYDRALAZINE HCL 20 MG/ML IJ SOLN: 10.0000 mg | Freq: Four times a day (QID) | INTRAMUSCULAR | Status: DC | PRN

## 2024-04-10 MED ORDER — VANCOMYCIN HCL 2000 MG/400ML IV SOLN
2000.0000 mg | Freq: Once | INTRAVENOUS | Status: AC
  Administered 2024-04-10: 2000 mg via INTRAVENOUS
  Filled 2024-04-10: qty 400

## 2024-04-10 MED ORDER — VANCOMYCIN HCL 1750 MG/350ML IV SOLN
1750.0000 mg | INTRAVENOUS | Status: DC
  Administered 2024-04-11: 1750 mg via INTRAVENOUS
  Filled 2024-04-10: qty 350

## 2024-04-10 MED ORDER — PIPERACILLIN-TAZOBACTAM 3.375 G IVPB
3.3750 g | Freq: Three times a day (TID) | INTRAVENOUS | Status: DC
  Administered 2024-04-10: 3.375 g via INTRAVENOUS
  Filled 2024-04-10: qty 50

## 2024-04-10 NOTE — Progress Notes (Cosign Needed Addendum)
 LTM EEG hooked up and running - no initial skin breakdown - push button tested - [facility] monitoring.CT leads used

## 2024-04-10 NOTE — Progress Notes (Addendum)
 PROGRESS NOTE        PATIENT DETAILS Name: [name] Age: 78 y.o. Sex: female Date of Birth: [birth year] Admit Date: 04/02/2024 Admitting Physician Terry LOISE Hurst, DO ERE:Xpoejumprx, Zachary, MD  Brief Summary: Patient is a 78 y.o.  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  She was brought to the ED on 11/17-for altered mental status-was found to have acute metabolic encephalopathy in the setting of PNA.  Significant events: 11/17>> admit to [facility]. 11/25>> barely responsive-urgent neurology consultation-stat CT head/ABG ordered.  Placed on LTM EEG by neurology.  Significant studies: 11/17>> CT head: Persistent hypodensity/mild edema right frontoparietal operculum 11/17>> CXR: Left> right airspace disease-atelectasis versus infection versus aspiration. 11/18>> EEG: No seizures.  Significant microbiology data: 11/17>> COVID/influenza/RSV PCR: Negative 11/17>> blood culture: No growth 11/18>> blood culture: No growth  Procedures: None  Consults: Palliative Neuro Oncology Neurology  Subjective: Has done remarkably well over the weekend-was awake/alert-although slightly lethargic yesterday was still relatively awake-however marked deterioration overnight-barely responsive-Will briefly open eyes to vigorous sternal rub.  Appears to be drooling from the left angle of the mouth.  Objective: Vitals: Blood pressure (!) 136/95, pulse 97, temperature (!) 100.4 F (38 C), temperature source Axillary, resp. rate 16, SpO2 94%.   Exam: Unresponsive but not in obvious distress Chest: Some transmitted upper  airway sounds but mostly clear CVS: S1-S2 regular Abdomen: Soft nontender nondistended Neurology: On hold to assess due to unresponsive status but seems to move all 4 extremities although very weak.  Pertinent Labs/Radiology:    Latest Ref Rng & Units 04/10/2024    9:11 AM 04/04/2024    3:41 AM 04/03/2024    6:04 AM  CBC  WBC 4.0 - 10.5 K/uL 9.4  9.0  10.0   Hemoglobin 12.0 - 15.0 g/dL 83.6  84.6  83.8   Hematocrit 36.0 - 46.0 % 51.0  47.4  49.1   Platelets 150 - 400 K/uL 320  237  221     Lab Results  Component Value Date   NA 134 (L) 04/10/2024   K 5.0 04/10/2024   CL 100 04/10/2024   CO2 15 (L) 04/10/2024      Assessment/Plan: Acute metabolic encephalopathy superimposed on dementia/delirium Secondary to sepsis physiology/aspiration pneumonia-had improved after treatment of underlying etiologies but markedly deteriorated on 11/25-remains unresponsive. Stat CT head with essentially unchanged right frontal lobe lesion-concern is that she may have had further breakthrough seizures resulting in encephalopathy or she could have aspirated  Neurology urgently consulted-LTM EEG started-so far no seizures Empirically started on vancomycin /Zosyn -cultures ordered. Continue to follow closely-see below regarding palliative care.  Sepsis secondary to presumed aspiration pneumonia (likely related to recent dysphagia in the setting of CVA/right frontal lobe lesion) Sepsis already had resolved-patient had completed a course of antibiotics on 11/22. Febrile this morning-unresponsive/encephalopathic-suspicion that she may have aspirated-although likely microaspiration-as chest x-ray does not show any significant infiltrates. Will empirically treat with vancomycin /Zosyn  Will not be able to tolerate MBS at this point.  Oropharyngeal dysphagia Likely related to recent CVA versus right frontal lobe tumor-and now likely due to encephalopathy On full liquids but currently n.p.o.-due to her mental  status. Will need to reengage SLP once her encephalopathy has improved.  AKI Mild Likely hemodynamically mediated Follow electrolytes periodically Avoid nephrotoxic agents.  Hyponatremia Mild Likely of no clinical significance Follow electrolytes periodically  Seizure Likely related to underlying right frontal lobe lesion Continue Keppra  Initially-encephalopathy was felt to be related to aspiration pneumonia-however due to significant deterioration in 11/25-concerned that she could be having breakthrough seizures causing encephalopathy-neurology consulted-LTM EEG started. Continue to follow closely.  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   Thankfully with supportive care-she seemed to improve-with improving diet-and resolution of encephalopathy.  Palliative care was consulted but due to clinical improvement-comfort measures were not considered. Unfortunately-has had a significant decline overnight-palliative care reconsulted.   HTN BP stable-amlodipine  held due to inability to take oral intake secondary to encephalopathy Added IV hydralazine  as needed.  HLD Statin  Polycythemia Mild Unclear etiology Stable for periodic follow-up.  GERD PPI  Dementia/cognitive dysfunction Hold Aricept  till encephalopathy improves.  Palliative care Has had some waxing/waning delirium-but markedly worse overnight.  See above discussion regarding low-grade glioma.  Due to significant encephalopathy-I reached out to patient's son [name] this morning-explained  tenuous/critical situation-risk for further deterioration-and limited means to treat/reverse underlying physiology (brain mass/dementia/oropharyngeal dysphagia).  While we try to elucidate etiology behind the sudden worsening-son understands that there is limited intervention like-adjustment of AED/antibiotics-but beyond these interventions-really no real means to intervene and reverse underlying causes.  DNR remains in place-palliative care consulted.   Addendum I was able to meet patient's son Celina at bedside and then subsequently spoke with his brother Martell over the phone when I was in the room-they understand that patient's underlying issues are just not from the brain tumor alone-it is probably a combination of dementia/debility/dysphagia all playing a role.  Although we could potentially treat seizures-and suspected aspiration pneumonia-this is essentially the third decompensation and has been a weeks, the suspicion is that this will likely be a recurring theme with frequent hospitalizations.  They understand-family discussion in progress regarding whether to continue with current course or transition to comfort measures.  DNR remains in place-family understands that there is really no further role in escalation of care at this point.  Palliative care continues to follow.  Class 2 Obesity: Estimated body mass index is 35.34 kg/m as calculated from the following:   Height as of 02/26/24: 5' 4 (1.626 m).   Weight as of 03/23/24: 93.4 kg.     The patient is critically ill with multiple organ system failure and requires high complexity decision making for assessment and support, frequent evaluation and titration of therapies, advanced monitoring, review of radiographic studies and interpretation of complex data.   Total Critical time spent equals 45 minutes  Code status:   Code Status: Limited: Do not attempt resuscitation (DNR) -DNR-LIMITED -Do Not Intubate/DNI    DVT  Prophylaxis: enoxaparin  (LOVENOX ) injection 40 mg Start: 04/02/24 2000   Family Communication: Son-[name] Rohrman-[phone] left VM-11/24   Disposition Plan: Status is: Inpatient Remains inpatient appropriate because: Severity of illness   Planned Discharge Destination:Skilled nursing facility likely on 11/25-has MBS planned for later this afternoon.  Diet: Diet Order             Diet full liquid Room service appropriate? Yes  with Assist; Fluid consistency: Thin  Diet effective now                     Antimicrobial agents: Anti-infectives (From admission, onward)    Start     Dose/Rate Route Frequency Ordered Stop   04/11/24 1045  vancomycin  (VANCOREADY) IVPB 1750 mg/350 mL        1,750 mg 175 mL/hr over 120 Minutes Intravenous Every 24 hours 04/10/24 0955     04/10/24 1045  piperacillin -tazobactam (ZOSYN ) IVPB 3.375 g        3.375 g 12.5 mL/hr over 240 Minutes Intravenous Every 8 hours 04/10/24 0949     04/10/24 1045  vancomycin  (VANCOREADY) IVPB 2000 mg/400 mL        2,000 mg 200 mL/hr over 120 Minutes Intravenous  Once 04/10/24 0949     04/07/24 1200  amoxicillin -clavulanate (AUGMENTIN ) 400-57 MG/5ML suspension 800 mg        800 mg Oral Every 12 hours 04/07/24 1108 04/07/24 2135   04/04/24 0945  Ampicillin -Sulbactam (UNASYN ) 3 g in sodium chloride  0.9 % 100 mL IVPB  Status:  Discontinued        3 g 200 mL/hr over 30 Minutes Intravenous Every 6 hours 04/04/24 0846 04/07/24 1107   04/03/24 0800  ceFEPIme  (MAXIPIME ) 2 g in sodium chloride  0.9 % 100 mL IVPB  Status:  Discontinued        2 g 200 mL/hr over 30 Minutes Intravenous Every 12 hours 04/02/24 2101 04/04/24 0820   04/03/24 0800  metroNIDAZOLE  (FLAGYL ) IVPB 500 mg  Status:  Discontinued        500 mg 100 mL/hr over 60 Minutes Intravenous 2 times daily 04/03/24 0747 04/04/24 0820   04/02/24 1930  vancomycin  (VANCOCIN ) IVPB 1000 mg/200 mL premix  Status:  Discontinued        1,000 mg 200 mL/hr over 60 Minutes  Intravenous  Once 04/02/24 1921 04/02/24 1922   04/02/24 1930  ceFEPIme  (MAXIPIME ) 2 g in sodium chloride  0.9 % 100 mL IVPB        2 g 200 mL/hr over 30 Minutes Intravenous  Once 04/02/24 1921 04/02/24 2006   04/02/24 1930  vancomycin  (VANCOREADY) IVPB 2000 mg/400 mL        2,000 mg 200 mL/hr over 120 Minutes Intravenous  Once 04/02/24 1922 04/02/24 2228        MEDICATIONS: Scheduled Meds:  amLODipine   10 mg Oral Daily   donepezil   10 mg Oral Daily   enoxaparin  (LOVENOX ) injection  40 mg Subcutaneous Q24H   levETIRAcetam   500 mg Intravenous Q12H   pantoprazole   40 mg Oral Daily   prednisoLONE  acetate  1 drop Right Eye QID   rosuvastatin   20 mg Oral Daily   Continuous Infusions:  piperacillin -tazobactam (ZOSYN )  IV     [START ON 04/11/2024] vancomycin      vancomycin  2,000 mg (04/10/24 1014)    PRN Meds:.acetaminophen , acetaminophen , LORazepam , melatonin, polyethylene glycol, prochlorperazine    I have personally reviewed following labs and imaging studies  LABORATORY DATA: CBC: Recent Labs  Lab 04/04/24 0341 04/10/24 0911  WBC 9.0 9.4  HGB 15.3* 16.3*  HCT 47.4* 51.0*  MCV 88.8 87.8  PLT 237 320    Basic Metabolic Panel: Recent Labs  Lab 04/04/24 0341 04/06/24 0250 04/07/24 0409 04/09/24 0602 04/10/24 0911  NA 134* 133* 135  --  134*  K 4.2 3.4* 3.5  --  5.0  CL 101 101 102  --  100  CO2 15* 15* 19*  --  15*  GLUCOSE 73 75 85  --  88  BUN 18 22 18   --  18  CREATININE 1.33* 1.22* 1.12* 1.14* 1.36*  CALCIUM  8.4* 8.6* 8.3*  --  9.0  MG 2.0  --  1.9  --   --   PHOS 4.7*  --  3.5  --   --     GFR: CrCl cannot be calculated (Unknown ideal weight.).  Liver Function Tests: Recent Labs  Lab 04/04/24 0341 04/10/24 0911  AST 34 55*  ALT 10 10  ALKPHOS 56 53  BILITOT 1.2 1.8*  PROT 7.0 7.8  ALBUMIN 2.7* 3.0*   No results for input(s): LIPASE, AMYLASE in the last 168 hours. No results for input(s): AMMONIA in the last 168 hours.  Coagulation  Profile: No results for input(s): INR, PROTIME in the last 168 hours.   Cardiac Enzymes: No results for input(s): CKTOTAL, CKMB, CKMBINDEX, TROPONINI in the last 168 hours.  BNP (last 3 results) No results for input(s): PROBNP in the last 8760 hours.  Lipid Profile: No results for input(s): CHOL, HDL, LDLCALC, TRIG, CHOLHDL, LDLDIRECT in the last 72 hours.  Thyroid Function Tests: No results for input(s): TSH, T4TOTAL, FREET4, T3FREE, THYROIDAB in the last 72 hours.  Anemia Panel: No results for input(s): VITAMINB12, FOLATE, FERRITIN, TIBC, IRON, RETICCTPCT in the last 72 hours.  Urine analysis:    Component Value Date/Time   COLORURINE YELLOW 04/02/2024 2030   APPEARANCEUR CLEAR 04/02/2024 2030   LABSPEC 1.018 04/02/2024 2030   PHURINE 5.0 04/02/2024 2030   GLUCOSEU NEGATIVE 04/02/2024 2030   HGBUR SMALL (A) 04/02/2024 2030   BILIRUBINUR NEGATIVE 04/02/2024 2030   BILIRUBINUR negative 01/02/2021 1225   BILIRUBINUR negative 06/03/2015 1024   KETONESUR 20 (A) 04/02/2024 2030   PROTEINUR 100 (A) 04/02/2024 2030   UROBILINOGEN 2.0 (A) 01/02/2021 1225   NITRITE NEGATIVE 04/02/2024 2030   LEUKOCYTESUR NEGATIVE 04/02/2024 2030    Sepsis Labs: Lactic Acid, Venous    Component Value Date/Time   LATICACIDVEN 1.2 04/02/2024 2255    MICROBIOLOGY: Recent Results (from the past 240 hours)  Resp panel by RT-PCR (RSV, Flu A&B, Covid) Anterior Nasal Swab     Status: None   Collection Time: 04/02/24  4:42 PM   Specimen: Anterior Nasal Swab  Result Value Ref Range Status   SARS Coronavirus 2 by RT PCR NEGATIVE NEGATIVE Final   Influenza A by PCR NEGATIVE NEGATIVE Final   Influenza B by PCR NEGATIVE NEGATIVE Final    Comment: (NOTE) The Xpert Xpress SARS-CoV-2/FLU/RSV plus assay is intended as an aid in the diagnosis of influenza from Nasopharyngeal swab specimens and should not be used as a sole basis for treatment. Nasal washings  and aspirates are unacceptable for Xpert Xpress SARS-CoV-2/FLU/RSV testing.  Fact Sheet for Patients: bloggercourse.com  Fact Sheet for Healthcare Providers: seriousbroker.it  This test is not yet approved or cleared by the [place]  FDA and has been authorized for detection and/or diagnosis of SARS-CoV-2 by FDA under an Emergency Use Authorization (EUA). This EUA will remain in effect (meaning this test can be used) for the duration of the COVID-19 declaration under Section 564(b)(1) of the Act, 21 U.S.C. section 360bbb-3(b)(1), unless the authorization is terminated or revoked.     Resp Syncytial Virus by PCR NEGATIVE NEGATIVE Final    Comment: (NOTE) Fact Sheet for Patients: bloggercourse.com  Fact Sheet for Healthcare Providers: seriousbroker.it  This test is not yet approved or  cleared by the [place]  FDA and has been authorized for detection and/or diagnosis of SARS-CoV-2 by FDA under an Emergency Use Authorization (EUA). This EUA will remain in effect (meaning this test can be used) for the duration of the COVID-19 declaration under Section 564(b)(1) of the Act, 21 U.S.C. section 360bbb-3(b)(1), unless the authorization is terminated or revoked.  Performed at [facility], [address], [place], [place] [address]   Culture, blood (Routine X 2) w Reflex to ID Panel     Status: None   Collection Time: 04/02/24 10:54 PM   Specimen: BLOOD LEFT HAND  Result Value Ref Range Status   Specimen Description BLOOD LEFT HAND  Final   Special Requests   Final    BOTTLES DRAWN AEROBIC AND ANAEROBIC Blood Culture adequate volume   Culture   Final    NO GROWTH 5 DAYS Performed at [facility], [address], [place], [place] [address]    Report Status 04/07/2024 FINAL  Final  Culture, blood (Routine X 2) w Reflex to ID Panel     Status: None   Collection  Time: 04/03/24  7:03 PM   Specimen: BLOOD  Result Value Ref Range Status   Specimen Description BLOOD SITE NOT SPECIFIED  Final   Special Requests   Final    BOTTLES DRAWN AEROBIC ONLY Blood Culture results may not be optimal due to an inadequate volume of blood received in culture bottles   Culture   Final    NO GROWTH 6 DAYS Performed at [facility], [address] [place], [place] [address]    Report Status 04/09/2024 FINAL  Final    RADIOLOGY STUDIES/RESULTS: DG CHEST PORT 1 VIEW Result Date: 04/10/2024 CLINICAL DATA:  [id number] Aspiration into airway [id number] EXAM: PORTABLE CHEST - 1 VIEW COMPARISON:  04/02/2024 FINDINGS: Low lung volumes. No focal airspace consolidation, pleural effusion, or pneumothorax. No cardiomegaly. Tortuous aorta with aortic atherosclerosis. No acute fracture or destructive lesions. Multilevel thoracic osteophytosis. IMPRESSION: Low lung volumes.  Otherwise, no acute cardiopulmonary abnormality. Electronically Signed   By: Rogelia Myers M.D.   On: 04/10/2024 10:34   CT HEAD WO CONTRAST ( ) Result Date: 04/10/2024 EXAM: CT HEAD WITHOUT 04/10/2024 08:49:16 AM TECHNIQUE: CT of the head was performed without the administration of intravenous contrast. Automated exposure control, iterative reconstruction, and/or weight based adjustment of the mA/kV was utilized to reduce the radiation dose to as low as reasonably achievable. COMPARISON: Head CT 04/02/2024 and MRI 03/23/2024. CLINICAL HISTORY: Mental status change, unknown cause. FINDINGS: BRAIN AND VENTRICLES: Hypodensity/mild edema in the right frontoparietal operculum on the prior CT has mildly decreased. A smaller region of hypodensity involving the right thalamus has also likely slightly decreased. No acute intracranial hemorrhage, acute large territory infarct, midline shift, hydrocephalus, or extra-axial fluid collection is identified. Confluent hypodensities elsewhere in the cerebral white matter  bilaterally are unchanged and nonspecific but compatible with severe chronic small vessel ischemic disease. Chronic heterogeneity is again noted throughout the deep gray nuclei. There is mild cerebral atrophy. Calcified atherosclerosis at the skull base. ORBITS: Bilateral cataract extraction. SINUSES AND MASTOIDS: New small fluid levels in the sphenoid sinuses. New mild mucosal thickening scattered throughout the paranasal sinuses. Persistent small right mastoid effusion. SOFT TISSUES AND SKULL: No acute skull fracture. No acute soft tissue abnormality. IMPRESSION: 1. Mildly decreased edema in the region of the right frontoparietal operculum and thalamus. 2. No acute intracranial abnormality. 3. Severe chronic small vessel ischemic disease.  4. New inflammatory disease in the paranasal sinuses including fluid in the sphenoid sinuses. Correlate for acute sinusitis. Electronically signed by: Dasie Hamburg MD 04/10/2024 09:04 AM EST RP Workstation: [id number]      LOS: 8 days   Donalda Applebaum, MD  [facility]    To contact the attending provider between 7A-7P or the covering provider during after hours 7P-7A, please log into the web site [email or web address] and access using universal [place] password for that web site. If you do not have the password, please call the hospital operator.  04/10/2024, 11:52 AM

## 2024-04-10 NOTE — Progress Notes (Signed)
 Chaplain responded to  consult for Advance Directives. Chaplain understands from chart documentation and pt bedside RN that pt does not have capacity to make these decisions at this time. Chaplain attempted pt visit to assess as well, but pt was sleeping and did not rouse. Chaplain contacted pt's son, [name], who is listed as emergency contact. He shared that he accidentally answered in the affirmative that they wanted someone to consult about ADs, but he understands his mother is unable to make these decisions and that the state would grant he and his brother decision making ability in the absence of other documentation. He reports that last week they obtained power of attorney and that their documents have a health care clause. Chaplain encouraged him to bring the documents for verification and scanning into the medical record as he is able.   [name]. Davee Lomax, M.Div. [facility] Chaplain Pager [phone] Office [phone]     04/10/24 1511  Spiritual Encounters  Type of Visit Attempt (pt unavailable)  Care provided to: [facility] partners present during encounter Nurse  Referral source Family  Reason for visit Advance directives  Advance Directives (For Healthcare)  Does Patient Have a Medical Advance Directive? Unable to assess, patient is non-responsive or altered mental status

## 2024-04-10 NOTE — Progress Notes (Signed)
 SLP Cancellation Note  Patient Details Name: JACKALYN HAITH MRN: [id number] DOB: Jan 07, 1946   Cancelled treatment:       Reason Eval/Treat Not Completed: Medical issues which prohibited therapy. Noted RR call due to AMS with subsequent diagnostic testing underway. Discussed with MD, who asks to hold MBS this date. Will f/u as able.    Damien Blumenthal, M.A., CCC-SLP Speech Language Pathology, Acute Rehabilitation Services  Secure Chat preferred [phone]  04/10/2024, 11:39 AM

## 2024-04-10 NOTE — Significant Event (Addendum)
 Rapid Response Event Note   Reason for Call :  AMS  Initial Focused Assessment:  Arrived to bedside, pt responds/localizes to painful stimuli and minimally opens eyes. Pupils 2-3, reactive. Skin warm on touch, cheeks flushed. Copious thick clear secretions suctioned from oral cavity; minimal gag. Concern for airway protection.  Per RN, told in shift report, overnight pt unable to be woken up for PO Keppra ; order changed to IV at that time. Pt previously alert and conversant.   119/86 (97) HR 110 RR 17 O2 92-95% RA 101.9 rectal  Interventions/Plan of Care:  Stat head CT Labs including ABG CXR Rectal tylenol ; Multiple blankets taken off, room temp turned down LTM  Event Summary:  MD Notified: CANDIE Applebaum MD, Neuro NP/MD Call Time:  Arrival Time: 9179 End Time: 0900  Tonna Chiquita POUR, RN

## 2024-04-10 NOTE — Consult Note (Signed)
 NEUROLOGY CONSULT NOTE   Date of service: April 10, 2024 Patient Name: [name] MRN:  [id number] DOB:  1945-11-03 Chief Complaint: Altered mental status Requesting Provider: Raenelle Donalda HERO, MD  History of Present Illness  [name] is a 78 y.o. 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  She was started on Keppra  500 mg twice daily with no further seizure activity during hospitalization.  She was set up with neuro-oncology follow-up however prior to this appointment she was readmitted with altered mental status on 11/17.  She was tentatively set to discharge yesterday, however she had a change in mental status and overnight she has become largely unresponsive.  Neurology was reengaged due to concern for breakthrough seizure.   She does appear to have some concern for aspiration as well as she is febrile with thick secretions.   ROS   Unable to ascertain due to unresponsive   Past History   Past Medical History:  Diagnosis Date   Hypertension     Past Surgical History:  Procedure Laterality Date   BREAST EXCISIONAL BIOPSY Bilateral    MEDIAL PARTIAL KNEE REPLACEMENT Bilateral 06/07/2012    Family History: Family History  Problem Relation Age of Onset   Stroke Mother    Stroke Father    Stroke Sister    Hypertension Brother     Social History  reports that she has never smoked. She has never used smokeless tobacco. She reports that she does not drink alcohol and does not use drugs.  Allergies  Allergen Reactions   Shellfish Protein-Containing Drug Products Other (See Comments)    Shrimp and lobster--severe swelling/itching   Carisoprodol-Aspirin -Codeine Other (See Comments)    Unknown    Codeine Nausea And Vomiting   Doxycycline Nausea Only   Soma [Carisoprodol] Other (See Comments)    Unknown and not listed on MAR.    Tetracycline Nausea Only    Medications   Current Facility-Administered Medications:    acetaminophen  (TYLENOL ) tablet 500 mg, 500 mg, Oral, Q6H PRN, Shona Terry SAILOR, DO, 500 mg at 04/03/24 2027   amLODipine  (NORVASC ) tablet 10 mg, 10 mg, Oral, Daily, Hall, Carole N, DO, 10 mg at 04/09/24 1058   donepezil  (ARICEPT ) tablet 10 mg, 10 mg, Oral, Daily, Ghimire, Donalda HERO, MD, 10 mg at 04/09/24 1057   enoxaparin  (LOVENOX ) injection 40 mg, 40 mg, Subcutaneous, Q24H, Hall, Carole N, DO, 40 mg at 04/09/24 2113   levETIRAcetam  (KEPPRA ) undiluted injection 500 mg, 500 mg, Intravenous, Q12H, Franky Redia SAILOR, MD, 500 mg at 04/10/24 0041   LORazepam  (ATIVAN ) injection 2 mg, 2 mg, Intravenous, Q6H PRN, Hall, Carole N, DO   melatonin tablet 5 mg, 5 mg, Oral, QHS PRN, Shona, Carole N, DO   pantoprazole  (PROTONIX ) EC tablet 40 mg, 40 mg, Oral, Daily, Ghimire, Donalda HERO, MD, 40 mg at 04/09/24 1058   polyethylene glycol (MIRALAX  / GLYCOLAX ) packet 17 g, 17 g, Oral, Daily PRN, Shona, Carole N, DO   prednisoLONE  acetate (PRED FORTE ) 1 % ophthalmic suspension 1 drop, 1 drop, Right Eye, QID, Ghimire, Donalda HERO, MD, 1 drop at 04/09/24 2115   prochlorperazine  (COMPAZINE ) injection 5 mg, 5 mg, Intravenous, Q6H PRN, Shona Terry SAILOR, DO   rosuvastatin  (CRESTOR ) tablet 20 mg, 20 mg, Oral, Daily, Shona Terry N, DO, 20 mg at 04/09/24 1058  Vitals   Vitals:   04/09/24 2004 04/09/24  2322 04/10/24 0346 04/10/24 0500  BP: 117/87 (!) 123/93 135/88   Pulse:  (!) 109  (!) 105  Resp:  15  16  Temp: 97.7 F (36.5 C) 99 F (37.2 C) 98.8 F (37.1 C)   TempSrc: Axillary Axillary Axillary   SpO2:  93%  94%    There is no height or weight on file to calculate BMI.   Physical Exam   Constitutional: Appears well-developed and well-nourished.  Psych: Unresponsive Eyes: No scleral injection.  HENT: No OP obstruction.  Head: Normocephalic.  Cardiovascular: Normal rate and regular rhythm.  Respiratory: Intermittent  snoring respirations, thick secretions GI: Soft.  No distension Skin: WDI.   Neurologic Examination    Neuro: Mental Status: Unresponsive, nonverbal, not following commands Cranial Nerves: II: Does not blink to threat III,IV, VI: Appears to have a left gaze preference V: Facial sensation is symmetric to temperature VII: Facial movement is symmetric resting and smiling VIII: No response to verbal stimuli X: Minimal cough, no gag XI: Shoulder shrug is symmetric. XII: Tongue protrudes midline without atrophy or fasciculations.  Motor: Tone is normal. Bulk is normal.  More purposeful movement noted with left upper and lower extremity Sensory: Withdraws to pain in all extremities, however more purposeful on the left Cerebellar: Unable to complete   Labs/Imaging/Neurodiagnostic studies   CBC:  Recent Labs  Lab 04-21-2024 0341  WBC 9.0  HGB 15.3*  HCT 47.4*  MCV 88.8  PLT 237   Basic Metabolic Panel:  Lab Results  Component Value Date   NA 135 04/07/2024   K 3.5 04/07/2024   CO2 19 (L) 04/07/2024   GLUCOSE 85 04/07/2024   BUN 18 04/07/2024   CREATININE 1.14 (H) 04/09/2024   CALCIUM  8.3 (L) 04/07/2024   GFRNONAA 49 (L) 04/09/2024   GFRAA >60 03/05/2019   Lipid Panel:  Lab Results  Component Value Date   LDLCALC 67 03/24/2024   HgbA1c:  Lab Results  Component Value Date   HGBA1C 4.9 03/24/2024   Urine Drug Screen: No results found for: LABOPIA, COCAINSCRNUR, LABBENZ, AMPHETMU, THCU, LABBARB  Alcohol Level     Component Value Date/Time   [facility] <15 03/23/2024 1110   INR  Lab Results  Component Value Date   INR 1.1 04/02/2024   APTT  Lab Results  Component Value Date   APTT 26 04/02/2024   AED levels:  Lab Results  Component Value Date   LEVETIRACETA 42.9 (H) 04/02/2024    MRI Brain(Personally reviewed): Demonstrating patchy gyriform enhancement within the abnormal right frontal operculum, but also confluent suspicious enhancement in  the right thalamus. Questionable also abnormal enhancement in the right superior frontal gyrus, although axial images suggest this might be a developmental venous anomaly instead (benign). Despite absence of regional mass effect, the constellation raises the possibility of infiltrative primary tumor.   CT Head without contrast(Personally reviewed): Mildly decreased edema in the region of the right frontoparietal operculum and thalamus. No acute intracranial abnormality. Severe chronic small vessel ischemic disease. New inflammatory disease in the paranasal sinuses including fluid in the sphenoid sinuses. Correlate for acute sinusitis.  Neurodiagnostics rEEG:   ASSESSMENT   [name] is a 78 y.o. 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 Neurology was reengaged due to concern for breakthrough seizure and unresponsiveness this morning. She does appear to have some concern for  aspiration as well as she is febrile with thick secretions. On exam she does localize to pain, weak cough, no gag. Temp 101.9 rectal.   RECOMMENDATIONS  - 2g Keppra  load - Tylenol  for fever - Continue keppra  500mg  BID- may increase depending on EEG results - LTM  - Consider repeat MRI if her neuro exam does not improve   Signed, Jorene Last, NP [facility]  ATTENDING ATTESTATION:  Urgent care this morning.  Rapid was called.  Unresponsive and confused distinct change from yesterday.  Had some twitching of the eyes concern for possible status.  Stat head CT was unremarkable.  No bleed.  Reviewed personally.  Does have a fever concern for possible pneumonia.  Chest x-ray ordered.  Check ammonia level.  Infectious workup recommended.  Loaded with Keppra  2 g.  Stat EEG negative for seizure.  Continue Keppra  500 twice daily.  If she does not  improve with infectious workup consider MRI.  She has DNR DNI.  Dr. Nichola evaluated pt independently, reviewed imaging, chart, labs. Discussed and formulated plan with the Resident/APP. Changes were made to the note where appropriate. Please see APP/resident note above for details.    FIF:Yphy. Pertinent labs, imaging results reviewed by me and considered in my decision making. Independently reviewed imaging. Medical records reviewed. Discussed the patient with another medical provider/personnel. Obtained history from someone other than the patient.   Jermaine Neuharth,MD

## 2024-04-10 NOTE — Progress Notes (Signed)
 Palliative Medicine Inpatient Follow Up Note HPI: 78 y.o. 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 Neurosurgery reports that tumor is not appropriate for surgical resection and recommend repeat MRI in 6-8 weeks and follow up with neuro-oncology. Admitted on 04/02/2024 with altered mental status with pneumonia.   Today's Discussion 04/10/2024  *Please note that this is a verbal dictation therefore any spelling or grammatical errors are due to the Dragon Medical One system interpretation.  I reviewed the chart notes including nursing notes from today, progress notes from today. I also reviewed vital signs, nursing flowsheets, medication administrations record, labs, and imaging.    I met with [name] at bedside this morning. She is awake though not conversant with me. She is able to open eyes. She is on RA and generally ill in appearance.   I called patients son's, [name] and [name] to discuss concerns associated with [name]'s health and her worsening neurological state. We reviewed he decline in responsiveness. I shared concerns associated with her ability to protect her airway. We reviewed the options of continuing present care or transitioning the focus to comfort measures.  Patients son's are able to share with me that they felt encouraged by her improvements at the end of last week. On Thursday they note she had an incredible day of communicating with family, eating, and she even requested a steak. I shared it is not uncommon for patients to experience a rally prior to a decline.   They felt that she was more herself. They also not having met with Dr. Buckley who felt there were things  that could be worked on in [employer] case.  Created space and opportunity for patients sons to explore thoughts feelings and fears regarding their mothers current medical situation.  Patients sons share that they understand Sireen's health and of course if we are at end of life they do not want to be intrusive. They would want her end to be peaceful and graceful. They feel conflicted however with the information that was provided a few days ago and where we are at today. They kindly request the insights of Dr. Buckley to help support them in additional decision moving forward.   Questions and concerns addressed/Palliative Support Provided.   Objective Assessment: Vital Signs Vitals:   04/10/24 0840 04/10/24 0900  BP: 119/86 (!) 120/91  Pulse: (!) 110 (!) 112  Resp: 17 15  Temp:    SpO2: 95% 96%    Intake/Output Summary (Last 24 hours) at 04/10/2024 0947 Last data filed at 04/10/2024 0435 Gross per 24 hour  Intake 5 ml  Output 300 ml  Net -295 ml   Gen:  Elderly Chronically ill appearing F  HEENT: moist mucous membranes CV: Regular rate and rhythm  PULM: On RA, breathing is even and nonlabored ABD: soft/nontender  EXT: No edema  Neuro: Opens eyes  SUMMARY OF RECOMMENDATIONS   DNAR/DNI  Discussed concerns related to patients current health with her two sons  Discussed options of continued aggressive care versus comfort care  Patients sons would like to gain the insights of Dr. Buckley prior to making additional decisions  Ongoing PMT support ______________________________________________________________________________________ [name] [facility] Palliative Medicine Team Team Cell Phone: [phone] Please utilize secure chat with additional questions, if  there is no response within 30 minutes please call the above phone number  Time Spent: 47 Billing based on MDM: High  Palliative Medicine Team providers are available by phone from 7am to 7pm daily and can be  reached through the team cell phone.  Should this patient require assistance outside of these hours, please call the patient's attending physician.

## 2024-04-10 NOTE — Progress Notes (Signed)
 PHARMACY ANTIBIOTIC CONSULT NOTE   [name] a 78 y.o. female with AMS. RR team called 11/25 AM d/t copius secretions and concern for airway, AMS.  Pharmacy has been consulted for Zosyn /vancomycin  dosing.  Scr 1.14 (04/09/2024), WBC 9.0 (04/04/2024)- Patient's BL Scr appears to be ~0.9, though her current level has been stable over the past week.   Vital Signs: Tm 101.9, HR elevated, BP WNL  CrCl cannot be calculated (Unknown ideal weight.).  Plan: START Zosyn  3.375 g IV Q8H (EI)  GIVE Vancomycin  2,000 mg IV x1 (Wt used: 93.4 kg)  THEN Vancomycin  1,750 mg IV Q24H (Scr used: 1.14, Vd used: 0.5, eAUC: 492)  F/U MRSA PCR  Monitor renal function, clinical status, de-escalation, C/S, levels as indicated   Allergies:  Allergies  Allergen Reactions   Shellfish Protein-Containing Drug Products Other (See Comments)    Shrimp and lobster--severe swelling/itching   Carisoprodol-Aspirin -Codeine Other (See Comments)    Unknown    Codeine Nausea And Vomiting   Doxycycline Nausea Only   Soma [Carisoprodol] Other (See Comments)    Unknown and not listed on MAR.   Tetracycline Nausea Only    There were no vitals filed for this visit.     Latest Ref Rng & Units 04/04/2024    3:41 AM 04/03/2024    6:04 AM 04/02/2024    4:23 PM  CBC  WBC 4.0 - 10.5 K/uL 9.0  10.0    Hemoglobin 12.0 - 15.0 g/dL 84.6  83.8  82.2   Hematocrit 36.0 - 46.0 % 47.4  49.1  52.0   Platelets 150 - 400 K/uL 237  221      Antibiotics Given (last 72 hours)     Date/Time Action Medication Dose Rate   04/07/24 1005 New Bag/Given   Ampicillin -Sulbactam (UNASYN ) 3 g in sodium chloride  0.9 % 100 mL IVPB 3 g 200 mL/hr   04/07/24 1314 Given   amoxicillin -clavulanate (AUGMENTIN ) 400-57 MG/5ML suspension 800 mg 800 mg    04/07/24 2135 Given   amoxicillin -clavulanate (AUGMENTIN ) 400-57 MG/5ML suspension 800 mg 800 mg        Antimicrobials this admission: Zosyn  11/25>>  Vanco 11/25>>   Microbiology  results: 11/17 Bcx: negF 11/25 Bcx: sent  11/25 MRSA PCR: sent  4-plex PCR not detected   Thank you for allowing pharmacy to be a part of this patient's care.  Massie Fila, PharmD Clinical Pharmacist  04/10/2024 9:49 AM

## 2024-04-10 NOTE — Procedures (Signed)
 LTM Prelim read first 20 minutes. Diffuse slowing  No seizures, no epileptiform discharges.    Thank you  Dr. Gregg

## 2024-04-11 ENCOUNTER — Inpatient Hospital Stay (HOSPITAL_COMMUNITY)

## 2024-04-11 DIAGNOSIS — F0390 Unspecified dementia without behavioral disturbance: Secondary | ICD-10-CM | POA: Diagnosis not present

## 2024-04-11 DIAGNOSIS — Z8673 Personal history of transient ischemic attack (TIA), and cerebral infarction without residual deficits: Secondary | ICD-10-CM | POA: Diagnosis not present

## 2024-04-11 DIAGNOSIS — Z7189 Other specified counseling: Secondary | ICD-10-CM | POA: Diagnosis not present

## 2024-04-11 DIAGNOSIS — J189 Pneumonia, unspecified organism: Secondary | ICD-10-CM | POA: Diagnosis not present

## 2024-04-11 DIAGNOSIS — G939 Disorder of brain, unspecified: Secondary | ICD-10-CM | POA: Diagnosis not present

## 2024-04-11 DIAGNOSIS — R569 Unspecified convulsions: Secondary | ICD-10-CM | POA: Diagnosis not present

## 2024-04-11 DIAGNOSIS — Z515 Encounter for palliative care: Secondary | ICD-10-CM | POA: Diagnosis not present

## 2024-04-11 LAB — BLOOD CULTURE ID PANEL (REFLEXED) - BCID2

## 2024-04-11 LAB — BASIC METABOLIC PANEL WITH GFR
Anion gap: 13 (ref 5–15)
BUN: 18 mg/dL (ref 8–23)
CO2: 18 mmol/L — ABNORMAL LOW (ref 22–32)
Calcium: 8.7 mg/dL — ABNORMAL LOW (ref 8.9–10.3)
Chloride: 101 mmol/L (ref 98–111)
Creatinine, Ser: 1.28 mg/dL — ABNORMAL HIGH (ref 0.44–1.00)
GFR, Estimated: 42 mL/min — ABNORMAL LOW (ref >60)
Glucose, Bld: 92 mg/dL (ref 70–99)
Potassium: 3.6 mmol/L (ref 3.5–5.1)
Sodium: 132 mmol/L — ABNORMAL LOW (ref 135–145)

## 2024-04-11 LAB — CBC
HCT: 44.6 % (ref 36.0–46.0)
Hemoglobin: 14.6 g/dL (ref 12.0–15.0)
MCH: 28.3 pg (ref 26.0–34.0)
MCHC: 32.7 g/dL (ref 30.0–36.0)
MCV: 86.4 fL (ref 80.0–100.0)
Platelets: 310 K/uL (ref 150–400)
RBC: 5.16 MIL/uL — ABNORMAL HIGH (ref 3.87–5.11)
RDW: 15 % (ref 11.5–15.5)
WBC: 8.5 K/uL (ref 4.0–10.5)
nRBC: 0 % (ref 0.0–0.2)

## 2024-04-11 NOTE — Plan of Care (Signed)
 VEEG overnight neg for seizures.  Ongoing discussion with pallative and family regarding comfort care.  Plan; D/C VEEG Con't keppra  500mg  BID  Neurology will sign off for now.

## 2024-04-11 NOTE — Procedures (Addendum)
 Patient Name: [name]  MRN: [id number]  Epilepsy Attending: Arlin MALVA Krebs  Referring Physician/Provider: Remi Pippin, NP  Duration: 04/10/2024 1039 to 04/11/2024 0936  Patient history: 78yo F with right frontal tumor and amd. EEG to evaluate for seizure  Level of alertness: Awake/ lethargic, asleep  AEDs during EEG study: LEV  Technical aspects: This EEG study was done with scalp electrodes positioned according to the 10-20 International system of electrode placement. Electrical activity was reviewed with band pass filter of 1-70Hz , sensitivity of 7 uV/mm, display speed of 17mm/sec with a 60Hz  notched filter applied as appropriate. EEG data were recorded continuously and digitally stored.  Video monitoring was available and reviewed as appropriate.  Description: No clear posterior dominant rhythm was seen. Sleep was characterized by sleep spindles (12 to 14 Hz), maximal frontocentral region.  There is continuous generalized 3 to 7 Hz theta-delta slowing. Hyperventilation and photic stimulation were not performed.     ABNORMALITY - Continuous slow, generalized  IMPRESSION: This study is suggestive of generalized cerebral dysfunction (encephalopathy). No seizures or epileptiform discharges were seen throughout the recording.   Hampton Cost O Ronon Ferger

## 2024-04-11 NOTE — Progress Notes (Signed)
 Speech Language Pathology Treatment: Dysphagia  Patient Details Name: [name] MRN: [id number] DOB: May 12, 1946 Today's Date: 04/11/2024 Time: 8646-8593 SLP Time Calculation (min) (ACUTE ONLY): 13 min  Assessment / Plan / Recommendation Clinical Impression  Pt is much more alert today, following commands and responding to questions given increased time. Straw sips of thin liquids were observed without signs clinically concerning for aspiration and she thoroughly masticated regular solids. Suspect intermittent aspiration is possible given fluctuating mentation and cognitive deficits, with which pt's sons have previously verbalized understanding. Will advance diet Dys 3 only to be offered with full supervision. Will continue following with consideration of repeating MBS now that alertness has improved (scheduling may be limited by holiday staffing).    HPI HPI: [name] is an 78 yo female who presented to ED from SNF with somnolence 11/17. Dx acute metabolic encephalopathy, sepsis secondary to HAP. Recent admission 11/ 7 -11/ 11 for seizures, cognitive changes, dysphagia and dysarthria. Dx R frontal infiltrative high-grade lesion, suspicious for tumor - nsgy recommended repeat MRI x six weeks. MBS 03/26/24 moderate pharyngeal  dysphagia with penetration thin liquids to the vocal folds; left oral residue.  Dysphagia 3/thin liquids recommended. Pt less responsive 11/25, RR called and CXR ordered, revealing mild L basilar atelectasis and possible minimal pneumonia or aspiration pneumonitis. PMHx HTN, osteoarthritis, and memory loss weakness.      SLP Plan  Continue with current plan of care          Recommendations  Diet recommendations: Dysphagia 3 (mechanical soft);Thin liquid Liquids provided via: Cup;Straw Medication Administration: Whole meds with puree Supervision: Full supervision/cueing for compensatory strategies;Staff to assist with self feeding Compensations: Minimize  environmental distractions;Slow rate;Small sips/bites Postural Changes and/or Swallow Maneuvers: Seated upright 90 degrees                  Oral care BID   Frequent or constant Supervision/Assistance Dysphagia, oropharyngeal phase (R13.12)     Continue with current plan of care     Damien Blumenthal, M.A., CCC-SLP Speech Language Pathology, Acute Rehabilitation Services  Secure Chat preferred [phone]   04/11/2024, 3:07 PM

## 2024-04-11 NOTE — Plan of Care (Signed)
  Problem: Education: Goal: Knowledge of General Education information will improve Description: Including pain rating scale, medication(s)/side effects and non-pharmacologic comfort measures Outcome: Progressing   Problem: Clinical Measurements: Goal: Will remain free from infection Outcome: Progressing Goal: Diagnostic test results will improve Outcome: Progressing   Problem: Activity: Goal: Risk for activity intolerance will decrease Outcome: Progressing   Problem: Nutrition: Goal: Adequate nutrition will be maintained Outcome: Progressing

## 2024-04-11 NOTE — Progress Notes (Signed)
 PROGRESS NOTE        PATIENT DETAILS Name: [name] Age: 78 y.o. Sex: female Date of Birth: 12/16/1945 Admit Date: 04/02/2024 Admitting Physician Terry LOISE Hurst, DO ERE:Xpoejumprx, Zachary, MD  Brief Summary: Patient is a 78 y.o.  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  She was brought to the ED on 11/17-for altered mental status-was found to have acute metabolic encephalopathy in the setting of PNA.  Significant events: 11/17>> admit to [facility]. 11/25>> barely responsive-urgent neurology consultation-stat CT head/ABG ordered.  Placed on LTM EEG by neurology. 11/26>>  Significant studies: 11/17>> CT head: Persistent hypodensity/mild edema right frontoparietal operculum 11/17>> CXR: Left> right airspace disease-atelectasis versus infection versus aspiration. 11/18>> EEG: No seizures. 11/25>> CT head: No acute intracranial abnormality.  Decreased edema in the region of the right frontoparietal operculum/thalamus. 11/25-11/26>> LTM EEG: No seizures. 11/26>> chest x-ray: Left sided pneumonia  Significant microbiology data: 11/17>> COVID/influenza/RSV PCR: Negative 11/17>> blood culture: No growth 11/18>> blood culture: No growth 11/25>> blood culture 1/2-gram-positive cocci-probably contaminant.  Procedures: None  Consults: Palliative Neuro Oncology Neurology  Subjective: Much more awake and alert today-although still a bit lethargic.  Objective: Vitals: Blood pressure 94/66, pulse 85, temperature 98.1 F (36.7 C), temperature source Axillary, resp. rate 15, weight 93.4 kg, SpO2 91%.    Exam: Much more awake and alert Able to speak in 1 and 2 words today Chest is clear to auscultation anteriorly CVS: S1-S2 regular Abdomen: Soft nontender Extremities: Trace edema Neurology: Difficult exam but seems to be moving all 4 extremities  Pertinent Labs/Radiology:    Latest Ref Rng & Units 04/11/2024    2:24 AM 04/10/2024    9:11 AM 04/04/2024    3:41 AM  CBC  WBC 4.0 - 10.5 K/uL 8.5  9.4  9.0   Hemoglobin 12.0 - 15.0 g/dL 85.3  83.6  84.6   Hematocrit 36.0 - 46.0 % 44.6  51.0  47.4   Platelets 150 - 400 K/uL 310  320  237     Lab Results  Component Value Date   NA 132 (L) 04/11/2024   K 3.6 04/11/2024   CL 101 04/11/2024   CO2 18 (L) 04/11/2024      Assessment/Plan: Acute metabolic encephalopathy superimposed on dementia/delirium Secondary to sepsis physiology/aspiration pneumonia-had improved after treatment of underlying etiologies but markedly deteriorated on 11/25-with unresponsiveness.  Suspect etiology for encephalopathy on 11/25 was probable aspiration pneumonia, no evidence of seizures on LTM EEG. Remarkable improvement overnight-continue Zosyn  Remains on AEDs Continue supportive care  Sepsis secondary to presumed aspiration pneumonia (likely related to recent dysphagia in the setting of CVA/right frontal lobe lesion) Sepsis already had resolved-patient had completed a course of antibiotics on 11/22 but redeveloped sepsis physiology/encephalopathy with fever on 11/25-started on broad-spectrum antibiotics-continue Zosyn .  Suspect does not need vancomycin -has blood culture (1/2 drawn on 11/25) probably represents a contamination-follow cultures until final.  Oropharyngeal dysphagia Likely related to recent CVA versus right frontal lobe tumor-and now likely due to encephalopathy On full liquids but currently n.p.o.-due to her mental status. Since mentation has improved-await SLP eval.  AKI Mild Likely hemodynamically mediated Follow electrolytes  periodically Avoid nephrotoxic agents.  Hyponatremia Mild Likely of no clinical significance Follow electrolytes periodically  Seizure Likely related to underlying right frontal lobe lesion Continue Keppra  Initially-encephalopathy was felt to be related to aspiration pneumonia-however due to significant deterioration in 11/25-LTM EEG was started-no seizures. Appreciate neurology input-continue Keppra  at current dose-plan is to DC LTM EEG today.    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    HTN BP stable-all antihypertensives currently on hold-resume when able.  HLD Statin on hold-once oral intake and mentation improves further-resume statin.   Polycythemia Mild Unclear etiology Stable for periodic follow-up.  GERD PPI  Dementia/cognitive dysfunction Hold Aricept  till encephalopathy improves.  Palliative care Has had some waxing/waning delirium-but became markedly worse on 11/25 with severe encephalopathy-essentially obtunded.  Suspect decompensation on 11/25 was likely due to sepsis related to aspiration pneumonia-no evidence of seizures.  Although has a low-grade glioma-suspect she remains at risk for aspiration issues/breakthrough seizures-which is probably more secondary to her underlying dementia/frailty and advanced age.  Long discussion with both sons by this MD on 11/25-family discussion in progress-they understand that patient will likely continue to have issues with breakthrough seizures/encephalopathy/aspiration pneumonia in the future.  Class 2  Obesity: Estimated body mass index is 35.34 kg/m as calculated from the following:   Height as of 02/26/24: 5' 4 (1.626 m).   Weight as of this encounter: 93.4 kg.    Code status:   Code Status: Limited: Do not attempt resuscitation (DNR) -DNR-LIMITED -Do Not Intubate/DNI    DVT Prophylaxis: enoxaparin  (LOVENOX ) injection 40 mg Start: 04/02/24 2000   Family Communication: Son-[name] Kaney-[phone] updated 11/27.   Disposition Plan: Status is: Inpatient Remains inpatient appropriate because: Severity of illness   Planned Discharge Destination:Skilled nursing facility likely on 11/25-has MBS planned for later this afternoon.  Diet: Diet Order             Diet full liquid Room service appropriate? Yes with Assist; Fluid consistency: Thin  Diet effective now                     Antimicrobial agents: Anti-infectives (From admission, onward)    Start     Dose/Rate Route Frequency Ordered Stop   04/11/24 1045  vancomycin  (VANCOREADY) IVPB 1750 mg/350 mL  Status:  Discontinued        1,750 mg 175 mL/hr over 120 Minutes Intravenous Every 24 hours 04/10/24 0955 04/11/24 1044   04/10/24 2200  piperacillin -tazobactam (ZOSYN ) IVPB 3.375 g        3.375 g 12.5 mL/hr over 240 Minutes Intravenous Every 8 hours 04/10/24 1418     04/10/24 1045  piperacillin -tazobactam (ZOSYN ) IVPB 3.375 g  Status:  Discontinued        3.375 g 12.5 mL/hr over 240 Minutes Intravenous Every 8 hours 04/10/24 0949 04/10/24 1418   04/10/24 1045  vancomycin  (VANCOREADY) IVPB 2000 mg/400 mL        2,000 mg 200 mL/hr over 120 Minutes Intravenous  Once 04/10/24 0949 04/11/24 0956   04/07/24 1200  amoxicillin -clavulanate (AUGMENTIN ) 400-57 MG/5ML suspension 800 mg        800 mg Oral Every 12 hours 04/07/24 1108 04/07/24 2135   04/04/24 0945  Ampicillin -Sulbactam (UNASYN ) 3 g in sodium  chloride 0.9 % 100 mL IVPB  Status:  Discontinued        3 g 200 mL/hr over 30 Minutes Intravenous Every 6 hours  04/04/24 0846 04/07/24 1107   04/03/24 0800  ceFEPIme  (MAXIPIME ) 2 g in sodium chloride  0.9 % 100 mL IVPB  Status:  Discontinued        2 g 200 mL/hr over 30 Minutes Intravenous Every 12 hours 04/02/24 2101 04/04/24 0820   04/03/24 0800  metroNIDAZOLE  (FLAGYL ) IVPB 500 mg  Status:  Discontinued        500 mg 100 mL/hr over 60 Minutes Intravenous 2 times daily 04/03/24 0747 04/04/24 0820   04/02/24 1930  vancomycin  (VANCOCIN ) IVPB 1000 mg/200 mL premix  Status:  Discontinued        1,000 mg 200 mL/hr over 60 Minutes Intravenous  Once 04/02/24 1921 04/02/24 1922   04/02/24 1930  ceFEPIme  (MAXIPIME ) 2 g in sodium chloride  0.9 % 100 mL IVPB        2 g 200 mL/hr over 30 Minutes Intravenous  Once 04/02/24 1921 04/02/24 2006   04/02/24 1930  vancomycin  (VANCOREADY) IVPB 2000 mg/400 mL        2,000 mg 200 mL/hr over 120 Minutes Intravenous  Once 04/02/24 1922 04/02/24 2228        MEDICATIONS: Scheduled Meds:  enoxaparin  (LOVENOX ) injection  40 mg Subcutaneous Q24H   levETIRAcetam   500 mg Intravenous Q12H   pantoprazole  (PROTONIX ) IV  40 mg Intravenous Q24H   prednisoLONE  acetate  1 drop Right Eye QID   Continuous Infusions:  sodium chloride  50 mL/hr at 04/10/24 1334   piperacillin -tazobactam (ZOSYN )  IV 3.375 g (04/11/24 0528)    PRN Meds:.acetaminophen , acetaminophen , hydrALAZINE , LORazepam , melatonin, polyethylene glycol, prochlorperazine    I have personally reviewed following labs and imaging studies  LABORATORY DATA: CBC: Recent Labs  Lab 04/10/24 0911 04/11/24 0224  WBC 9.4 8.5  HGB 16.3* 14.6  HCT 51.0* 44.6  MCV 87.8 86.4  PLT 320 310    Basic Metabolic Panel: Recent Labs  Lab 04/06/24 0250 04/07/24 0409 04/09/24 0602 04/10/24 0911 04/11/24 0224  NA 133* 135  --  134* 132*  K 3.4* 3.5  --  5.0 3.6  CL 101 102  --  100 101  CO2 15* 19*  --  15* 18*  GLUCOSE 75 85  --  88 92  BUN 22 18  --  18 18  CREATININE 1.22* 1.12* 1.14* 1.36* 1.28*  CALCIUM  8.6*  8.3*  --  9.0 8.7*  MG  --  1.9  --   --   --   PHOS  --  3.5  --   --   --     GFR: Estimated Creatinine Clearance: 38.8 mL/min (A) (by C-G formula based on SCr of 1.28 mg/dL (H)).  Liver Function Tests: Recent Labs  Lab 04/10/24 0911  AST 55*  ALT 10  ALKPHOS 53  BILITOT 1.8*  PROT 7.8  ALBUMIN 3.0*   No results for input(s): LIPASE, AMYLASE in the last 168 hours. Recent Labs  Lab 04/10/24 1132  AMMONIA 26    Coagulation Profile: No results for input(s): INR, PROTIME in the last 168 hours.   Cardiac Enzymes: No results for input(s): CKTOTAL, CKMB, CKMBINDEX, TROPONINI in the last 168 hours.  BNP (last 3 results) No results for input(s): PROBNP in the last 8760 hours.  Lipid Profile: No results for input(s): CHOL, HDL, LDLCALC, TRIG, CHOLHDL, LDLDIRECT in the last 72 hours.  Thyroid Function Tests: No results for input(s): TSH, T4TOTAL, FREET4, T3FREE, THYROIDAB in the last 72 hours.  Anemia Panel: No results for input(s): VITAMINB12, FOLATE, FERRITIN, TIBC, IRON, RETICCTPCT in the last 72 hours.  Urine analysis:    Component Value Date/Time   COLORURINE YELLOW 04/02/2024 2030   APPEARANCEUR CLEAR 04/02/2024 2030   LABSPEC 1.018 04/02/2024 2030   PHURINE 5.0 04/02/2024 2030   GLUCOSEU NEGATIVE 04/02/2024 2030   HGBUR SMALL (A) 04/02/2024 2030   BILIRUBINUR NEGATIVE 04/02/2024 2030   BILIRUBINUR negative 01/02/2021 1225   BILIRUBINUR negative 06/03/2015 1024   KETONESUR 20 (A) 04/02/2024 2030   PROTEINUR 100 (A) 04/02/2024 2030   UROBILINOGEN 2.0 (A) 01/02/2021 1225   NITRITE NEGATIVE 04/02/2024 2030   LEUKOCYTESUR NEGATIVE 04/02/2024 2030    Sepsis Labs: Lactic Acid, Venous    Component Value Date/Time   LATICACIDVEN 1.2 04/02/2024 2255    MICROBIOLOGY: Recent Results (from the past 240 hours)  Resp panel by RT-PCR (RSV, Flu A&B, Covid) Anterior Nasal Swab     Status: None   Collection Time:  04/02/24  4:42 PM   Specimen: Anterior Nasal Swab  Result Value Ref Range Status   SARS Coronavirus 2 by RT PCR NEGATIVE NEGATIVE Final   Influenza A by PCR NEGATIVE NEGATIVE Final   Influenza B by PCR NEGATIVE NEGATIVE Final    Comment: (NOTE) The Xpert Xpress SARS-CoV-2/FLU/RSV plus assay is intended as an aid in the diagnosis of influenza from Nasopharyngeal swab specimens and should not be used as a sole basis for treatment. Nasal washings and aspirates are unacceptable for Xpert Xpress SARS-CoV-2/FLU/RSV testing.  Fact Sheet for Patients: bloggercourse.com  Fact Sheet for Healthcare Providers: seriousbroker.it  This test is not yet approved or cleared by the [place]  FDA and has been authorized for detection and/or diagnosis of SARS-CoV-2 by FDA under an Emergency Use Authorization (EUA). This EUA will remain in effect (meaning this test can be used) for the duration of the COVID-19 declaration under Section 564(b)(1) of the Act, 21 U.S.C. section 360bbb-3(b)(1), unless the authorization is terminated or revoked.     Resp Syncytial Virus by PCR NEGATIVE NEGATIVE Final    Comment: (NOTE) Fact Sheet for Patients: bloggercourse.com  Fact Sheet for Healthcare Providers: seriousbroker.it  This test is not yet approved or cleared by the [place]  FDA and has been authorized for detection and/or diagnosis of SARS-CoV-2 by FDA under an Emergency Use Authorization (EUA). This EUA will remain in effect (meaning this test can be used) for the duration of the COVID-19 declaration under Section 564(b)(1) of the Act, 21 U.S.C. section 360bbb-3(b)(1), unless the authorization is terminated or revoked.  Performed at [facility], [address], [place], [place] [address]   Culture, blood (Routine X 2) w Reflex to ID Panel     Status: None   Collection Time: 04/02/24  10:54 PM   Specimen: BLOOD LEFT HAND  Result Value Ref Range Status   Specimen Description BLOOD LEFT HAND  Final   Special Requests   Final    BOTTLES DRAWN AEROBIC AND ANAEROBIC Blood Culture adequate volume   Culture   Final    NO GROWTH 5 DAYS Performed at [facility], [address], [place], [place] [address]    Report Status 04/07/2024 FINAL  Final  Culture, blood (Routine X 2) w Reflex to ID Panel     Status: None   Collection Time: 04/03/24  7:03 PM   Specimen: BLOOD  Result Value Ref Range Status   Specimen Description BLOOD SITE NOT SPECIFIED  Final   Special Requests   Final    BOTTLES DRAWN AEROBIC ONLY Blood Culture results may not be optimal due to an inadequate volume of blood received in culture bottles   Culture   Final    NO GROWTH 6 DAYS Performed at [facility], [address], [place], [place] [address]    Report Status 04/09/2024 FINAL  Final  Culture, blood (Routine X 2) w Reflex to ID Panel     Status: None (Preliminary result)   Collection Time: 04/10/24 10:12 AM   Specimen: BLOOD RIGHT HAND  Result Value Ref Range Status   Specimen Description BLOOD RIGHT HAND  Final   Special Requests   Final    BOTTLES DRAWN AEROBIC ONLY Blood Culture results may not be optimal due to an inadequate volume of blood received in culture bottles   Culture   Final    NO GROWTH < 24 HOURS Performed at [facility], [address] [place], [place] [address]    Report Status PENDING  Incomplete  Culture, blood (Routine X 2) w Reflex to ID Panel     Status: None (Preliminary result)   Collection Time: 04/10/24 10:14 AM   Specimen: BLOOD LEFT HAND  Result Value Ref Range Status   Specimen Description BLOOD LEFT HAND  Final   Special Requests   Final    BOTTLES DRAWN AEROBIC ONLY Blood Culture results may not be optimal due to an inadequate volume of blood received in culture bottles   Culture  Setup Time   Final    GRAM POSITIVE COCCI AEROBIC BOTTLE  ONLY RBV VBRYK MIRANDA PHARM D 04/11/2024 @ 0542 BY DD Performed at [facility], [address], [place], [place] [address]    Culture GRAM POSITIVE COCCI  Final   Report Status PENDING  Incomplete  Blood Culture ID Panel (Reflexed)     Status: Abnormal   Collection Time: 04/10/24 10:14 AM  Result Value Ref Range Status   Enterococcus faecalis NOT DETECTED NOT DETECTED Final   Enterococcus Faecium NOT DETECTED NOT DETECTED Final   Listeria monocytogenes NOT DETECTED NOT DETECTED Final   Staphylococcus species DETECTED (A) NOT DETECTED Final    Comment: CRITICAL RESULT CALLED TO, READ BACK BY AND VERIFIED WITH: RBV VBRYK MIRANDA PHARM D 04/11/2024 @ 0539 BY DD    Staphylococcus aureus (BCID) NOT DETECTED NOT DETECTED Final   Staphylococcus epidermidis NOT DETECTED NOT DETECTED Final   Staphylococcus lugdunensis NOT DETECTED NOT DETECTED Final   Streptococcus species NOT DETECTED NOT DETECTED Final   Streptococcus agalactiae NOT DETECTED NOT DETECTED Final   Streptococcus pneumoniae NOT DETECTED NOT DETECTED Final   Streptococcus pyogenes NOT DETECTED NOT DETECTED Final   A.calcoaceticus-baumannii NOT DETECTED NOT DETECTED Final   Bacteroides fragilis NOT DETECTED NOT DETECTED Final   Enterobacterales NOT DETECTED NOT DETECTED Final   Enterobacter cloacae complex NOT DETECTED NOT DETECTED Final   Escherichia coli NOT DETECTED NOT DETECTED Final   Klebsiella aerogenes NOT DETECTED NOT DETECTED Final   Klebsiella oxytoca NOT DETECTED NOT DETECTED Final   Klebsiella pneumoniae NOT DETECTED NOT DETECTED Final   Proteus species NOT DETECTED NOT DETECTED Final   Salmonella species NOT DETECTED NOT DETECTED Final   Serratia marcescens NOT DETECTED NOT DETECTED Final   Haemophilus influenzae NOT DETECTED NOT DETECTED Final   Neisseria meningitidis NOT DETECTED NOT DETECTED Final  Pseudomonas aeruginosa NOT DETECTED NOT DETECTED Final   Stenotrophomonas maltophilia NOT DETECTED NOT  DETECTED Final   Candida albicans NOT DETECTED NOT DETECTED Final   Candida auris NOT DETECTED NOT DETECTED Final   Candida glabrata NOT DETECTED NOT DETECTED Final   Candida krusei NOT DETECTED NOT DETECTED Final   Candida parapsilosis NOT DETECTED NOT DETECTED Final   Candida tropicalis NOT DETECTED NOT DETECTED Final   Cryptococcus neoformans/gattii NOT DETECTED NOT DETECTED Final    Comment: Performed at [facility], [address] [place], [place] [address]  MRSA Next Gen by PCR, Nasal     Status: None   Collection Time: 04/10/24  9:45 PM   Specimen: Nasal Mucosa; Nasal Swab  Result Value Ref Range Status   MRSA by PCR Next Gen NOT DETECTED NOT DETECTED Final    Comment: (NOTE) The GeneXpert MRSA Assay (FDA approved for NASAL specimens only), is one component of a comprehensive MRSA colonization surveillance program. It is not intended to diagnose MRSA infection nor to guide or monitor treatment for MRSA infections. Test performance is not FDA approved in patients less than 62 years old. Performed at [facility], [address] [place], [place] [address]     RADIOLOGY STUDIES/RESULTS: DG Chest Port 1 View Result Date: 04/11/2024 CLINICAL DATA:  Shortness of breath. Recent aspiration into the airway. EXAM: PORTABLE CHEST 1 VIEW COMPARISON:  04/10/2024 FINDINGS: Normal sized heart. Small amount of left basilar atelectasis and minimal patchy density. Otherwise, clear lungs with normal vascularity. Tortuous and partially calcified thoracic aorta. Thoracic spine degenerative changes. Diffuse osteopenia. IMPRESSION: Mild left basilar atelectasis and possible minimal pneumonia or aspiration pneumonitis. Electronically Signed   By: Elspeth Bathe M.D.   On: 04/11/2024 10:40   Overnight EEG with video Result Date: 04/11/2024 Shelton Arlin KIDD, MD     04/11/2024  6:52 AM Patient Name: [name] MRN: [id number] Epilepsy Attending: Arlin KIDD Shelton Referring  Physician/Provider: Remi Pippin, NP Duration: 04/10/2024 1039 to 04/11/2024 0620 Patient history: 78yo F with right frontal tumor and amd. EEG to evaluate for seizure Level of alertness: Awake/ lethargic, asleep AEDs during EEG study: LEV Technical aspects: This EEG study was done with scalp electrodes positioned according to the 10-20 International system of electrode placement. Electrical activity was reviewed with band pass filter of 1-70Hz , sensitivity of 7 uV/mm, display speed of 47mm/sec with a 60Hz  notched filter applied as appropriate. EEG data were recorded continuously and digitally stored.  Video monitoring was available and reviewed as appropriate. Description: No clear posterior dominant rhythm was seen. Sleep was characterized by sleep spindles (12 to 14 Hz), maximal frontocentral region.  There is continuous generalized 3 to 7 Hz theta-delta slowing. Hyperventilation and photic stimulation were not performed.   ABNORMALITY - Continuous slow, generalized IMPRESSION: This study is suggestive of generalized cerebral dysfunction (encephalopathy). No seizures or epileptiform discharges were seen throughout the recording. Arlin KIDD Shelton   DG CHEST PORT 1 VIEW Result Date: 04/10/2024 CLINICAL DATA:  [id number] Aspiration into airway [id number] EXAM: PORTABLE CHEST - 1 VIEW COMPARISON:  04/02/2024 FINDINGS: Low lung volumes. No focal airspace consolidation, pleural effusion, or pneumothorax. No cardiomegaly. Tortuous aorta with aortic atherosclerosis. No acute fracture or destructive lesions. Multilevel thoracic osteophytosis. IMPRESSION: Low lung volumes.  Otherwise, no acute cardiopulmonary abnormality. Electronically Signed   By: Rogelia Myers M.D.   On: 04/10/2024 10:34   CT HEAD WO CONTRAST ( ) Result Date: 04/10/2024 EXAM: CT HEAD WITHOUT 04/10/2024 08:49:16  AM TECHNIQUE: CT of the head was performed without the administration of intravenous contrast. Automated exposure control, iterative  reconstruction, and/or weight based adjustment of the mA/kV was utilized to reduce the radiation dose to as low as reasonably achievable. COMPARISON: Head CT 04/02/2024 and MRI 03/23/2024. CLINICAL HISTORY: Mental status change, unknown cause. FINDINGS: BRAIN AND VENTRICLES: Hypodensity/mild edema in the right frontoparietal operculum on the prior CT has mildly decreased. A smaller region of hypodensity involving the right thalamus has also likely slightly decreased. No acute intracranial hemorrhage, acute large territory infarct, midline shift, hydrocephalus, or extra-axial fluid collection is identified. Confluent hypodensities elsewhere in the cerebral white matter bilaterally are unchanged and nonspecific but compatible with severe chronic small vessel ischemic disease. Chronic heterogeneity is again noted throughout the deep gray nuclei. There is mild cerebral atrophy. Calcified atherosclerosis at the skull base. ORBITS: Bilateral cataract extraction. SINUSES AND MASTOIDS: New small fluid levels in the sphenoid sinuses. New mild mucosal thickening scattered throughout the paranasal sinuses. Persistent small right mastoid effusion. SOFT TISSUES AND SKULL: No acute skull fracture. No acute soft tissue abnormality. IMPRESSION: 1. Mildly decreased edema in the region of the right frontoparietal operculum and thalamus. 2. No acute intracranial abnormality. 3. Severe chronic small vessel ischemic disease. 4. New inflammatory disease in the paranasal sinuses including fluid in the sphenoid sinuses. Correlate for acute sinusitis. Electronically signed by: Dasie Hamburg MD 04/10/2024 09:04 AM EST RP Workstation: [id number]      LOS: 9 days   Donalda Applebaum, MD  [facility]    To contact the attending provider between 7A-7P or the covering provider during after hours 7P-7A, please log into the web site [email or web address] and access using universal [place] password for that web site. If you do not have the  password, please call the hospital operator.  04/11/2024, 11:42 AM

## 2024-04-11 NOTE — Progress Notes (Signed)
 LTM EEG discontinued - no skin breakdown at [facility].

## 2024-04-11 NOTE — TOC Progression Note (Addendum)
 Transition of Care [facility]) - Progression Note    Patient Details  Name: BALI LYN MRN: [id number] Date of Birth: 1946/05/01  Transition of Care [facility]) CM/SW Contact  Inocente GORMAN Kindle, LCSW Phone Number: 04/11/2024, 9:39 AM  Clinical Narrative:    CSW continuing to follow. Updated [place].   Expected Discharge Plan: Skilled Nursing Facility Barriers to Discharge: Continued Medical Work up               Expected Discharge Plan and Services In-house Referral: Clinical Social Work   Post Acute Care Choice: Skilled Nursing Facility Living arrangements for the past 2 months: Skilled Nursing Facility, Apartment                                       Social Drivers of Health (SDOH) Interventions SDOH Screenings   Food Insecurity: No Food Insecurity (04/05/2024)  Housing: Low Risk  (04/05/2024)  Transportation Needs: No Transportation Needs (04/05/2024)  Utilities: Not At Risk (04/05/2024)  Social Connections: Moderately Integrated (04/05/2024)  Tobacco Use: Low Risk  (08/11/2023)    Readmission Risk Interventions     No data to display

## 2024-04-11 NOTE — Progress Notes (Signed)
 Physical Therapy Treatment Patient Details Name: [name] MRN: [id number] DOB: 1945-12-30 Today's Date: 04/11/2024   History of Present Illness 78 y.o. female presents to [facility] 04/02/24 with AMS and somnolence. Found to have sepsis due to PNA. 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 Recently d/c from hospital to SNF on 11/11. 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    PT Comments  Pt with fair tolerance to treatment today. Pt with similar presentation to previous session. Pt more alert today compared to previous session however still requiring total A for all mobility. No change in DC/DME recs at this time. PT will continue to follow.     If plan is discharge home, recommend the following: Two people to help with walking and/or transfers;Two people to help with bathing/dressing/bathroom;Direct supervision/assist for medications management;Direct supervision/assist for financial management;Assist for transportation;Help with stairs or ramp for entrance;Supervision due to cognitive status   Can travel by private vehicle     No  Equipment Recommendations  Wheelchair (measurements PT);Wheelchair cushion (measurements PT);Hospital bed;Hoyer lift    Recommendations for Other Services       Precautions / Restrictions Precautions Precautions: Fall Recall of Precautions/Restrictions: Impaired Restrictions Weight Bearing Restrictions Per Provider Order: No     Mobility  Bed Mobility Overal bed mobility: Needs Assistance Bed Mobility: Supine to Sit, Sit to Supine     Supine to sit: Total assist Sit to supine: Total assist   General bed mobility comments: total A via helicopter method.    Transfers                    General transfer comment: NT    Ambulation/Gait                   Stairs             Wheelchair Mobility     Tilt Bed    Modified Rankin (Stroke Patients Only)       Balance Overall balance assessment: Needs assistance Sitting-balance support: Bilateral upper extremity supported, Feet unsupported Sitting balance-Leahy Scale: Poor Sitting balance - Comments: posterior lean Postural control: Posterior lean, Left lateral lean                                  Communication Communication Communication: Impaired Factors Affecting Communication: Reduced clarity of speech;Difficulty expressing self  Cognition Arousal: Alert Behavior During Therapy: Flat affect   PT - Cognitive impairments: History of cognitive impairments, Difficult to assess Difficult to assess due to: Impaired communication                     PT - Cognition Comments: Pt more alert today compared to previous session however would only respond yes or mhmm to all questions. Following commands: Impaired Following commands impaired: Follows one step commands with increased time    Cueing Cueing Techniques: Verbal cues, Tactile cues  Exercises General Exercises - Upper Extremity Shoulder Flexion: PROM, Both, 5 reps, Supine Elbow Flexion: PROM, Both, 5 reps, Supine General Exercises - Lower Extremity Hip Flexion/Marching: PROM, Both, 5 reps, Supine    General Comments General comments (skin integrity, edema, etc.): VSS      Pertinent Vitals/Pain Pain Assessment Pain  Assessment: Faces Faces Pain Scale: No hurt Pain Intervention(s): Monitored during session    Home Living                          Prior Function            PT Goals (current goals can now be found in the care plan section) Progress towards PT goals: Progressing toward goals    Frequency    Min 2X/week      PT Plan      Co-evaluation              AM-PAC PT  6 Clicks Mobility   Outcome Measure  Help needed turning from your back to your side while in a flat bed without using bedrails?: Total Help needed moving from lying on your back to sitting on the side of a flat bed without using bedrails?: Total Help needed moving to and from a bed to a chair (including a wheelchair)?: Total Help needed standing up from a chair using your arms (e.g., wheelchair or bedside chair)?: Total Help needed to walk in hospital room?: Total Help needed climbing 3-5 steps with a railing? : Total 6 Click Score: 6    End of Session Equipment Utilized During Treatment: Gait belt Activity Tolerance: Patient tolerated treatment well Patient left: in bed;with call bell/phone within reach;with bed alarm set;with family/visitor present Nurse Communication: Mobility status PT Visit Diagnosis: Repeated falls (R29.6);Muscle weakness (generalized) (M62.81);Difficulty in walking, not elsewhere classified (R26.2);Hemiplegia and hemiparesis Hemiplegia - Right/Left: Left Hemiplegia - dominant/non-dominant: Non-dominant Hemiplegia - caused by: Nontraumatic intracerebral hemorrhage     Time: 1423-1435 PT Time Calculation (min) (ACUTE ONLY): 12 min  Charges:    $Therapeutic Activity: 8-22 mins PT General Charges $$ ACUTE PT VISIT: 1 Visit                     [name], PT, DPT Acute Rehab Services [phone]    Colena Ketterman 04/11/2024, 4:17 PM

## 2024-04-11 NOTE — Progress Notes (Signed)
                                                                                                                                                                                                          Palliative Medicine Progress Note   Patient Name: [name]       Date: 04/11/2024 DOB: [birth year]  Age: 78 y.o. MRN#: [id number] Attending Physician: Raenelle Donalda HERO, MD Primary Care Physician: Hillman Bare, MD Admit Date: 04/02/2024  Reason for Consultation/Follow-up: {Reason for Consult:23484}  HPI/Patient Profile: 78 y.o. 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 Neurosurgery reports that tumor is not appropriate for surgical resection and recommend repeat MRI in 6-8 weeks and follow up with neuro-oncology. Admitted on 04/02/2024 with altered mental status with pneumonia.   Subjective: ***  Objective:  Physical Exam          Vital Signs: BP 98/64 (BP Location: Right Arm)   Pulse 85   Temp 97.6 F (36.4 C) (Axillary)   Resp 15   Wt 93.4 kg   SpO2 91%   BMI 35.34 kg/m  SpO2: SpO2: 91 % O2 Device: O2 Device: Room Air O2 Flow Rate: O2 Flow Rate (L/min): 2 L/min  Intake/output summary:  Intake/Output Summary (Last 24 hours) at 04/11/2024 1550 Last data filed at 04/11/2024 1507 Gross per 24 hour  Intake 1115.96 ml  Output 500 ml  Net 615.96 ml    LBM: Last BM Date : 04/04/24     Palliative Assessment/Data: ***     Palliative Medicine Assessment & Plan   Assessment: Principal Problem:   AMS (altered mental status)    Recommendations/Plan: ***  Goals of Care and Additional Recommendations: Limitations on Scope of Treatment: {Recommended  Scope and Preferences:21019}  Code Status:   Prognosis:  {Palliative Care Prognosis:23504}  Discharge Planning: {Palliative dispostion:23505}  Care plan was discussed with ***  Thank you for allowing the Palliative Medicine Team to assist in the care of this patient.   ***   Recardo KATHEE Loll, NP   Please contact Palliative Medicine Team phone at [phone] for questions and concerns.  For individual providers, please see AMION.

## 2024-04-12 DIAGNOSIS — F0390 Unspecified dementia without behavioral disturbance: Secondary | ICD-10-CM | POA: Diagnosis not present

## 2024-04-12 DIAGNOSIS — Z7189 Other specified counseling: Secondary | ICD-10-CM | POA: Diagnosis not present

## 2024-04-12 DIAGNOSIS — Z515 Encounter for palliative care: Secondary | ICD-10-CM | POA: Diagnosis not present

## 2024-04-12 DIAGNOSIS — G939 Disorder of brain, unspecified: Secondary | ICD-10-CM | POA: Diagnosis not present

## 2024-04-12 LAB — BASIC METABOLIC PANEL WITH GFR
Anion gap: 12 (ref 5–15)
BUN: 21 mg/dL (ref 8–23)
CO2: 20 mmol/L — ABNORMAL LOW (ref 22–32)
Calcium: 8.6 mg/dL — ABNORMAL LOW (ref 8.9–10.3)
Chloride: 102 mmol/L (ref 98–111)
Creatinine, Ser: 1.34 mg/dL — ABNORMAL HIGH (ref 0.44–1.00)
GFR, Estimated: 40 mL/min — ABNORMAL LOW (ref >60)
Glucose, Bld: 92 mg/dL (ref 70–99)
Potassium: 3.3 mmol/L — ABNORMAL LOW (ref 3.5–5.1)
Sodium: 134 mmol/L — ABNORMAL LOW (ref 135–145)

## 2024-04-12 LAB — CBC
HCT: 42.8 % (ref 36.0–46.0)
Hemoglobin: 13.7 g/dL (ref 12.0–15.0)
MCH: 28.3 pg (ref 26.0–34.0)
MCHC: 32 g/dL (ref 30.0–36.0)
MCV: 88.4 fL (ref 80.0–100.0)
Platelets: 271 K/uL (ref 150–400)
RBC: 4.84 MIL/uL (ref 3.87–5.11)
RDW: 14.9 % (ref 11.5–15.5)
WBC: 7.7 K/uL (ref 4.0–10.5)
nRBC: 0 % (ref 0.0–0.2)

## 2024-04-12 MED ORDER — POTASSIUM CHLORIDE 20 MEQ PO PACK
40.0000 meq | PACK | Freq: Once | ORAL | Status: AC
  Administered 2024-04-12: 40 meq via ORAL
  Filled 2024-04-12: qty 2

## 2024-04-12 MED ORDER — PANTOPRAZOLE SODIUM 40 MG IV SOLR
40.0000 mg | INTRAVENOUS | Status: DC
  Administered 2024-04-12: 40 mg via INTRAVENOUS

## 2024-04-12 NOTE — Plan of Care (Signed)
  Problem: Education: Goal: Knowledge of General Education information will improve Description: Including pain rating scale, medication(s)/side effects and non-pharmacologic comfort measures 04/12/2024 0355 by Jori Roderic CROME, RN Outcome: Progressing 04/12/2024 0355 by Jori Roderic CROME, RN Outcome: Progressing   Problem: Health Behavior/Discharge Planning: Goal: Ability to manage health-related needs will improve 04/12/2024 0355 by Jori Roderic CROME, RN Outcome: Progressing 04/12/2024 0355 by Jori Roderic CROME, RN Outcome: Progressing   Problem: Clinical Measurements: Goal: Ability to maintain clinical measurements within normal limits will improve 04/12/2024 0355 by Jori Roderic CROME, RN Outcome: Progressing 04/12/2024 0355 by Jori Roderic CROME, RN Outcome: Progressing Goal: Will remain free from infection 04/12/2024 0355 by Jori Roderic CROME, RN Outcome: Progressing 04/12/2024 0355 by Jori Roderic CROME, RN Outcome: Progressing Goal: Diagnostic test results will improve 04/12/2024 0355 by Jori Roderic CROME, RN Outcome: Progressing 04/12/2024 0355 by Jori Roderic CROME, RN Outcome: Progressing Goal: Respiratory complications will improve 04/12/2024 0355 by Jori Roderic CROME, RN Outcome: Progressing 04/12/2024 0355 by Jori Roderic CROME, RN Outcome: Progressing Goal: Cardiovascular complication will be avoided 04/12/2024 0355 by Jori Roderic CROME, RN Outcome: Progressing 04/12/2024 0355 by Jori Roderic CROME, RN Outcome: Progressing   Problem: Activity: Goal: Risk for activity intolerance will decrease 04/12/2024 0355 by Jori Roderic CROME, RN Outcome: Progressing 04/12/2024 0355 by Jori Roderic CROME, RN Outcome: Progressing   Problem: Nutrition: Goal: Adequate nutrition will be maintained 04/12/2024 0355 by Jori Roderic CROME, RN Outcome: Progressing 04/12/2024 0355 by Jori Roderic CROME, RN Outcome:  Progressing   Problem: Coping: Goal: Level of anxiety will decrease 04/12/2024 0355 by Jori Roderic CROME, RN Outcome: Progressing 04/12/2024 0355 by Jori Roderic CROME, RN Outcome: Progressing   Problem: Elimination: Goal: Will not experience complications related to bowel motility 04/12/2024 0355 by Jori Roderic CROME, RN Outcome: Progressing 04/12/2024 0355 by Jori Roderic CROME, RN Outcome: Progressing Goal: Will not experience complications related to urinary retention 04/12/2024 0355 by Jori Roderic CROME, RN Outcome: Progressing 04/12/2024 0355 by Jori Roderic CROME, RN Outcome: Progressing   Problem: Pain Managment: Goal: General experience of comfort will improve and/or be controlled 04/12/2024 0355 by Jori Roderic CROME, RN Outcome: Progressing 04/12/2024 0355 by Jori Roderic CROME, RN Outcome: Progressing   Problem: Safety: Goal: Ability to remain free from injury will improve 04/12/2024 0355 by Jori Roderic CROME, RN Outcome: Progressing 04/12/2024 0355 by Jori Roderic CROME, RN Outcome: Progressing   Problem: Skin Integrity: Goal: Risk for impaired skin integrity will decrease 04/12/2024 0355 by Jori Roderic CROME, RN Outcome: Progressing 04/12/2024 0355 by Jori Roderic CROME, RN Outcome: Progressing

## 2024-04-12 NOTE — Plan of Care (Signed)
  Problem: Education: Goal: Knowledge of General Education information will improve Description: Including pain rating scale, medication(s)/side effects and non-pharmacologic comfort measures 04/12/2024 0356 by Jori Roderic CROME, RN Outcome: Progressing 04/12/2024 0355 by Jori Roderic CROME, RN Outcome: Progressing 04/12/2024 0355 by Jori Roderic CROME, RN Outcome: Progressing   Problem: Health Behavior/Discharge Planning: Goal: Ability to manage health-related needs will improve 04/12/2024 0356 by Jori Roderic CROME, RN Outcome: Progressing 04/12/2024 0355 by Jori Roderic CROME, RN Outcome: Progressing 04/12/2024 0355 by Jori Roderic CROME, RN Outcome: Progressing   Problem: Clinical Measurements: Goal: Ability to maintain clinical measurements within normal limits will improve 04/12/2024 0356 by Jori Roderic CROME, RN Outcome: Progressing 04/12/2024 0355 by Jori Roderic CROME, RN Outcome: Progressing 04/12/2024 0355 by Jori Roderic CROME, RN Outcome: Progressing Goal: Will remain free from infection 04/12/2024 0356 by Jori Roderic CROME, RN Outcome: Progressing 04/12/2024 0355 by Jori Roderic CROME, RN Outcome: Progressing 04/12/2024 0355 by Jori Roderic CROME, RN Outcome: Progressing Goal: Diagnostic test results will improve 04/12/2024 0356 by Jori Roderic CROME, RN Outcome: Progressing 04/12/2024 0355 by Jori Roderic CROME, RN Outcome: Progressing 04/12/2024 0355 by Jori Roderic CROME, RN Outcome: Progressing Goal: Respiratory complications will improve 04/12/2024 0356 by Jori Roderic CROME, RN Outcome: Progressing 04/12/2024 0355 by Jori Roderic CROME, RN Outcome: Progressing 04/12/2024 0355 by Jori Roderic CROME, RN Outcome: Progressing Goal: Cardiovascular complication will be avoided 04/12/2024 0356 by Jori Roderic CROME, RN Outcome: Progressing 04/12/2024 0355 by Jori Roderic CROME, RN Outcome:  Progressing 04/12/2024 0355 by Jori Roderic CROME, RN Outcome: Progressing   Problem: Activity: Goal: Risk for activity intolerance will decrease 04/12/2024 0356 by Jori Roderic CROME, RN Outcome: Progressing 04/12/2024 0355 by Jori Roderic CROME, RN Outcome: Progressing 04/12/2024 0355 by Jori Roderic CROME, RN Outcome: Progressing   Problem: Nutrition: Goal: Adequate nutrition will be maintained 04/12/2024 0356 by Jori Roderic CROME, RN Outcome: Progressing 04/12/2024 0355 by Jori Roderic CROME, RN Outcome: Progressing 04/12/2024 0355 by Jori Roderic CROME, RN Outcome: Progressing   Problem: Coping: Goal: Level of anxiety will decrease 04/12/2024 0356 by Jori Roderic CROME, RN Outcome: Progressing 04/12/2024 0355 by Jori Roderic CROME, RN Outcome: Progressing 04/12/2024 0355 by Jori Roderic CROME, RN Outcome: Progressing   Problem: Elimination: Goal: Will not experience complications related to bowel motility 04/12/2024 0356 by Jori Roderic CROME, RN Outcome: Progressing 04/12/2024 0355 by Jori Roderic CROME, RN Outcome: Progressing 04/12/2024 0355 by Jori Roderic CROME, RN Outcome: Progressing Goal: Will not experience complications related to urinary retention 04/12/2024 0356 by Jori Roderic CROME, RN Outcome: Progressing 04/12/2024 0355 by Jori Roderic CROME, RN Outcome: Progressing 04/12/2024 0355 by Jori Roderic CROME, RN Outcome: Progressing   Problem: Pain Managment: Goal: General experience of comfort will improve and/or be controlled 04/12/2024 0356 by Jori Roderic CROME, RN Outcome: Progressing 04/12/2024 0355 by Jori Roderic CROME, RN Outcome: Progressing 04/12/2024 0355 by Jori Roderic CROME, RN Outcome: Progressing   Problem: Safety: Goal: Ability to remain free from injury will improve 04/12/2024 0356 by Jori Roderic CROME, RN Outcome: Progressing 04/12/2024 0355 by Jori Roderic CROME, RN Outcome:  Progressing 04/12/2024 0355 by Jori Roderic CROME, RN Outcome: Progressing   Problem: Skin Integrity: Goal: Risk for impaired skin integrity will decrease 04/12/2024 0356 by Jori Roderic CROME, RN Outcome: Progressing 04/12/2024 0355 by Jori Roderic CROME, RN Outcome: Progressing 04/12/2024 0355 by Jori Roderic CROME, RN Outcome: Progressing

## 2024-04-12 NOTE — Progress Notes (Signed)
 PROGRESS NOTE        PATIENT DETAILS Name: [name] Age: 78 y.o. Sex: female Date of Birth: [birth year] Admit Date: 04/02/2024 Admitting Physician Terry LOISE Hurst, DO ERE:Xpoejumprx, Zachary, MD  Brief Summary: Patient is a 78 y.o.  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  She was brought to the ED on 11/17-for altered mental status-was found to have acute metabolic encephalopathy in the setting of PNA.  Significant events: 11/17>> admit to [facility]. 11/25>> barely responsive-urgent neurology consultation-stat CT head/ABG ordered.  Placed on LTM EEG by neurology. 11/26>>  Significant studies: 11/17>> CT head: Persistent hypodensity/mild edema right frontoparietal operculum 11/17>> CXR: Left> right airspace disease-atelectasis versus infection versus aspiration. 11/18>> EEG: No seizures. 11/25>> CT head: No acute intracranial abnormality.  Decreased edema in the region of the right frontoparietal operculum/thalamus. 11/25-11/26>> LTM EEG: No seizures. 11/26>> chest x-ray: Left sided pneumonia  Significant microbiology data: 11/17>> COVID/influenza/RSV PCR: Negative 11/17>> blood culture: No growth 11/18>> blood culture: No growth 11/25>> blood culture 1/2-gram-positive cocci-probably contaminant.  Procedures: None  Consults: Palliative Neuro Oncology Neurology  Subjective: Easily arouses to just a verbal stimuli-answers simple questions occasionally-pleasantly confused.  No major issues overnight.  Objective: Vitals: Blood pressure 108/75, pulse 85, temperature 98 F (36.7 C), temperature source  Axillary, resp. rate 15, weight 93.4 kg, SpO2 91%.   Exam: Much more awake/alert Speaks clearly in 1-2 word sentences Pleasantly confused Moving all 4 extremities but continues to have mild left-sided hemiparesis.  Pertinent Labs/Radiology:    Latest Ref Rng & Units 04/12/2024    3:28 AM 04/11/2024    2:24 AM 04/10/2024    9:11 AM  CBC  WBC 4.0 - 10.5 K/uL 7.7  8.5  9.4   Hemoglobin 12.0 - 15.0 g/dL 86.2  85.3  83.6   Hematocrit 36.0 - 46.0 % 42.8  44.6  51.0   Platelets 150 - 400 K/uL 271  310  320     Lab Results  Component Value Date   NA 134 (L) 04/12/2024   K 3.3 (L) 04/12/2024   CL 102 04/12/2024   CO2 20 (L) 04/12/2024      Assessment/Plan: Acute metabolic encephalopathy superimposed on dementia/delirium Secondary to sepsis physiology/aspiration pneumonia-had improved after treatment of underlying etiologies but markedly deteriorated on 11/25-with unresponsiveness.  Suspect etiology for encephalopathy on 11/25 was probable aspiration pneumonia, no evidence of seizures on LTM EEG.  She was started on empiric antibiotics and has remarkably improved Continue Zosyn  Remains on AEDs. Supportive care.  Sepsis secondary to presumed aspiration pneumonia (likely related to recent dysphagia in the setting of CVA/right frontal lobe lesion) Sepsis already had resolved-patient had completed a course of antibiotics on 11/22 but redeveloped sepsis physiology/encephalopathy with fever on 11/25-started on broad-spectrum antibiotics-continue Zosyn -and clinically improved.  Gram-positive bacteremia (probably coag negative species) Likely a contamination-await final speciation.  Oropharyngeal dysphagia Likely related to recent CVA versus right frontal lobe tumor-and now likely due to encephalopathy Clinically improved-SLP following-has been n.p.o. related to dysphagia 3 diet-watch closely.  AKI Mild Likely hemodynamically mediated Follow electrolytes periodically Avoid nephrotoxic  agents.  Hyponatremia Mild Likely of no clinical significance  Follow electrolytes periodically  Hypokalemia Replete/recheck.  Seizure Likely related to underlying right frontal lobe lesion Continue Keppra  Initially-encephalopathy was felt to be related to aspiration pneumonia-however due to significant deterioration in 11/25-LTM EEG was started-no seizures. Appreciate neurology input-continue Keppra  at current dose-plan is to DC LTM EEG today.    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    HTN BP stable-all antihypertensives currently on hold-resume when able.  HLD Statin on hold-once oral intake and mentation improves further-resume statin.   Polycythemia Mild Unclear etiology Stable for periodic follow-up.  GERD PPI  Dementia/cognitive dysfunction Hold Aricept  till encephalopathy improves.  Palliative care Has had some waxing/waning delirium-but became markedly worse on 11/25 with severe encephalopathy-essentially obtunded.  Suspect decompensation on 11/25 was likely due to sepsis related to aspiration pneumonia-no evidence of seizures.  Although has a low-grade glioma-suspect she remains at risk for aspiration issues/breakthrough seizures-which is probably more secondary to her underlying dementia/frailty and advanced age.  Long discussion with both sons by this MD on 11/25-family discussion in progress-they understand that patient will likely continue to have issues with breakthrough seizures/encephalopathy/aspiration pneumonia in the future.  Thankfully  with antibiotics-patient's encephalopathy has improved-we will continue supportive care-continue to engage family.  Will need close outpatient follow-up with palliative care at SNF.  Class 2 Obesity: Estimated body mass index is 35.34 kg/m as calculated from the following:   Height as of 02/26/24: 5' 4 (1.626 m).   Weight as of this encounter: 93.4 kg.    Code status:   Code Status: Limited: Do not attempt resuscitation (DNR) -DNR-LIMITED -Do Not Intubate/DNI    DVT Prophylaxis: enoxaparin  (LOVENOX ) injection 40 mg Start: 04/02/24 2000   Family Communication: Son-[name] Hermance-[phone] updated 11/27.   Disposition Plan: Status is: Inpatient Remains inpatient appropriate because: Severity of illness   Planned Discharge Destination:Skilled nursing facility likely on 11/25-has MBS planned for later this afternoon.  Diet: Diet Order             DIET DYS 3 Room service appropriate? No; Fluid consistency: Thin  Diet effective now                     Antimicrobial agents: Anti-infectives (From admission, onward)    Start     Dose/Rate Route Frequency Ordered Stop   04/11/24 1045  vancomycin  (VANCOREADY) IVPB 1750 mg/350 mL  Status:  Discontinued        1,750 mg 175 mL/hr over 120 Minutes Intravenous Every 24 hours 04/10/24 0955 04/11/24 1044   04/10/24 2200  piperacillin -tazobactam (ZOSYN ) IVPB 3.375 g        3.375 g 12.5 mL/hr over 240 Minutes Intravenous Every 8 hours 04/10/24 1418     04/10/24 1045  piperacillin -tazobactam (ZOSYN ) IVPB 3.375 g  Status:  Discontinued        3.375 g 12.5 mL/hr over 240 Minutes Intravenous Every 8 hours 04/10/24 0949 04/10/24 1418   04/10/24 1045  vancomycin  (VANCOREADY) IVPB 2000 mg/400 mL        2,000 mg 200 mL/hr over 120 Minutes Intravenous  Once 04/10/24 0949 04/11/24 0956   04/07/24 1200  amoxicillin -clavulanate (AUGMENTIN ) 400-57 MG/5ML suspension 800 mg        800 mg Oral Every 12 hours 04/07/24  1108 04/07/24 2135    04/04/24 0945  Ampicillin -Sulbactam (UNASYN ) 3 g in sodium chloride  0.9 % 100 mL IVPB  Status:  Discontinued        3 g 200 mL/hr over 30 Minutes Intravenous Every 6 hours 04/04/24 0846 04/07/24 1107   04/03/24 0800  ceFEPIme  (MAXIPIME ) 2 g in sodium chloride  0.9 % 100 mL IVPB  Status:  Discontinued        2 g 200 mL/hr over 30 Minutes Intravenous Every 12 hours 04/02/24 2101 04/04/24 0820   04/03/24 0800  metroNIDAZOLE  (FLAGYL ) IVPB 500 mg  Status:  Discontinued        500 mg 100 mL/hr over 60 Minutes Intravenous 2 times daily 04/03/24 0747 04/04/24 0820   04/02/24 1930  vancomycin  (VANCOCIN ) IVPB 1000 mg/200 mL premix  Status:  Discontinued        1,000 mg 200 mL/hr over 60 Minutes Intravenous  Once 04/02/24 1921 04/02/24 1922   04/02/24 1930  ceFEPIme  (MAXIPIME ) 2 g in sodium chloride  0.9 % 100 mL IVPB        2 g 200 mL/hr over 30 Minutes Intravenous  Once 04/02/24 1921 04/02/24 2006   04/02/24 1930  vancomycin  (VANCOREADY) IVPB 2000 mg/400 mL        2,000 mg 200 mL/hr over 120 Minutes Intravenous  Once 04/02/24 1922 04/02/24 2228        MEDICATIONS: Scheduled Meds:  enoxaparin  (LOVENOX ) injection  40 mg Subcutaneous Q24H   levETIRAcetam   500 mg Intravenous Q12H   pantoprazole  (PROTONIX ) IV  40 mg Intravenous Q24H   prednisoLONE  acetate  1 drop Right Eye QID   Continuous Infusions:  piperacillin -tazobactam (ZOSYN )  IV 3.375 g (04/12/24 0458)    PRN Meds:.acetaminophen , acetaminophen , hydrALAZINE , LORazepam , melatonin, polyethylene glycol, prochlorperazine    I have personally reviewed following labs and imaging studies  LABORATORY DATA: CBC: Recent Labs  Lab 04/10/24 0911 04/11/24 0224 04/12/24 0328  WBC 9.4 8.5 7.7  HGB 16.3* 14.6 13.7  HCT 51.0* 44.6 42.8  MCV 87.8 86.4 88.4  PLT 320 310 271    Basic Metabolic Panel: Recent Labs  Lab 04/06/24 0250 04/07/24 0409 04/09/24 0602 04/10/24 0911 04/11/24 0224 04/12/24 0533  NA 133* 135  --  134* 132* 134*   K 3.4* 3.5  --  5.0 3.6 3.3*  CL 101 102  --  100 101 102  CO2 15* 19*  --  15* 18* 20*  GLUCOSE 75 85  --  88 92 92  BUN 22 18  --  18 18 21   CREATININE 1.22* 1.12* 1.14* 1.36* 1.28* 1.34*  CALCIUM  8.6* 8.3*  --  9.0 8.7* 8.6*  MG  --  1.9  --   --   --   --   PHOS  --  3.5  --   --   --   --     GFR: Estimated Creatinine Clearance: 37.1 mL/min (A) (by C-G formula based on SCr of 1.34 mg/dL (H)).  Liver Function Tests: Recent Labs  Lab 04/10/24 0911  AST 55*  ALT 10  ALKPHOS 53  BILITOT 1.8*  PROT 7.8  ALBUMIN 3.0*   No results for input(s): LIPASE, AMYLASE in the last 168 hours. Recent Labs  Lab 04/10/24 1132  AMMONIA 26    Coagulation Profile: No results for input(s): INR, PROTIME in the last 168 hours.   Cardiac Enzymes: No results for input(s): CKTOTAL, CKMB, CKMBINDEX, TROPONINI in the last 168 hours.  BNP (last 3 results)  No results for input(s): PROBNP in the last 8760 hours.  Lipid Profile: No results for input(s): CHOL, HDL, LDLCALC, TRIG, CHOLHDL, LDLDIRECT in the last 72 hours.  Thyroid Function Tests: No results for input(s): TSH, T4TOTAL, FREET4, T3FREE, THYROIDAB in the last 72 hours.  Anemia Panel: No results for input(s): VITAMINB12, FOLATE, FERRITIN, TIBC, IRON, RETICCTPCT in the last 72 hours.  Urine analysis:    Component Value Date/Time   COLORURINE YELLOW 04/02/2024 2030   APPEARANCEUR CLEAR 04/02/2024 2030   LABSPEC 1.018 04/02/2024 2030   PHURINE 5.0 04/02/2024 2030   GLUCOSEU NEGATIVE 04/02/2024 2030   HGBUR SMALL (A) 04/02/2024 2030   BILIRUBINUR NEGATIVE 04/02/2024 2030   BILIRUBINUR negative 01/02/2021 1225   BILIRUBINUR negative 06/03/2015 1024   KETONESUR 20 (A) 04/02/2024 2030   PROTEINUR 100 (A) 04/02/2024 2030   UROBILINOGEN 2.0 (A) 01/02/2021 1225   NITRITE NEGATIVE 04/02/2024 2030   LEUKOCYTESUR NEGATIVE 04/02/2024 2030    Sepsis Labs: Lactic Acid, Venous     Component Value Date/Time   LATICACIDVEN 1.2 04/02/2024 2255    MICROBIOLOGY: Recent Results (from the past 240 hours)  Resp panel by RT-PCR (RSV, Flu A&B, Covid) Anterior Nasal Swab     Status: None   Collection Time: 04/02/24  4:42 PM   Specimen: Anterior Nasal Swab  Result Value Ref Range Status   SARS Coronavirus 2 by RT PCR NEGATIVE NEGATIVE Final   Influenza A by PCR NEGATIVE NEGATIVE Final   Influenza B by PCR NEGATIVE NEGATIVE Final    Comment: (NOTE) The Xpert Xpress SARS-CoV-2/FLU/RSV plus assay is intended as an aid in the diagnosis of influenza from Nasopharyngeal swab specimens and should not be used as a sole basis for treatment. Nasal washings and aspirates are unacceptable for Xpert Xpress SARS-CoV-2/FLU/RSV testing.  Fact Sheet for Patients: bloggercourse.com  Fact Sheet for Healthcare Providers: seriousbroker.it  This test is not yet approved or cleared by the [place]  FDA and has been authorized for detection and/or diagnosis of SARS-CoV-2 by FDA under an Emergency Use Authorization (EUA). This EUA will remain in effect (meaning this test can be used) for the duration of the COVID-19 declaration under Section 564(b)(1) of the Act, 21 U.S.C. section 360bbb-3(b)(1), unless the authorization is terminated or revoked.     Resp Syncytial Virus by PCR NEGATIVE NEGATIVE Final    Comment: (NOTE) Fact Sheet for Patients: bloggercourse.com  Fact Sheet for Healthcare Providers: seriousbroker.it  This test is not yet approved or cleared by the [place]  FDA and has been authorized for detection and/or diagnosis of SARS-CoV-2 by FDA under an Emergency Use Authorization (EUA). This EUA will remain in effect (meaning this test can be used) for the duration of the COVID-19 declaration under Section 564(b)(1) of the Act, 21 U.S.C. section 360bbb-3(b)(1),  unless the authorization is terminated or revoked.  Performed at [facility], [address] [place], [place] [address]   Culture, blood (Routine X 2) w Reflex to ID Panel     Status: None   Collection Time: 04/02/24 10:54 PM   Specimen: BLOOD LEFT HAND  Result Value Ref Range Status   Specimen Description BLOOD LEFT HAND  Final   Special Requests   Final    BOTTLES DRAWN AEROBIC AND ANAEROBIC Blood Culture adequate volume   Culture   Final    NO GROWTH 5 DAYS Performed at [facility], [address], [place], [place] [address]    Report Status 04/07/2024 FINAL  Final  Culture, blood (Routine X 2) w Reflex to ID Panel     Status: None   Collection Time: 04/03/24  7:03 PM   Specimen: BLOOD  Result Value Ref Range Status   Specimen Description BLOOD SITE NOT SPECIFIED  Final   Special Requests   Final    BOTTLES DRAWN AEROBIC ONLY Blood Culture results may not be optimal due to an inadequate volume of blood received in culture bottles   Culture   Final    NO GROWTH 6 DAYS Performed at [facility], [address] [place], [place] [address]    Report Status 04/09/2024 FINAL  Final  Culture, blood (Routine X 2) w Reflex to ID Panel     Status: None (Preliminary result)   Collection Time: 04/10/24 10:12 AM   Specimen: BLOOD RIGHT HAND  Result Value Ref Range Status   Specimen Description BLOOD RIGHT HAND  Final   Special Requests   Final    BOTTLES DRAWN AEROBIC ONLY Blood Culture results may not be optimal due to an inadequate volume of blood received in culture bottles   Culture   Final    NO GROWTH 2 DAYS Performed at [facility], [address] [place], [place] [address]    Report Status PENDING  Incomplete  Culture, blood (Routine X 2) w Reflex to ID Panel     Status: None (Preliminary result)   Collection Time: 04/10/24 10:14 AM   Specimen: BLOOD LEFT HAND  Result Value Ref Range Status   Specimen Description BLOOD LEFT HAND  Final    Special Requests   Final    BOTTLES DRAWN AEROBIC ONLY Blood Culture results may not be optimal due to an inadequate volume of blood received in culture bottles   Culture  Setup Time   Final    GRAM POSITIVE COCCI AEROBIC BOTTLE ONLY RBV VBRYK MIRANDA PHARM D 04/11/2024 @ 0542 BY DD    Culture   Final    GRAM POSITIVE COCCI IDENTIFICATION TO FOLLOW Performed at [facility], [address], [place], [place] [address]    Report Status PENDING  Incomplete  Blood Culture ID Panel (Reflexed)     Status: Abnormal   Collection Time: 04/10/24 10:14 AM  Result Value Ref Range Status   Enterococcus faecalis NOT DETECTED NOT DETECTED Final   Enterococcus Faecium NOT DETECTED NOT DETECTED Final   Listeria monocytogenes NOT DETECTED NOT DETECTED Final   Staphylococcus species DETECTED (A) NOT DETECTED Final    Comment: CRITICAL RESULT CALLED TO, READ BACK BY AND VERIFIED WITH: RBV VBRYK MIRANDA PHARM D 04/11/2024 @ 0539 BY DD    Staphylococcus aureus (BCID) NOT DETECTED NOT DETECTED Final   Staphylococcus epidermidis NOT DETECTED NOT DETECTED Final   Staphylococcus lugdunensis NOT DETECTED NOT DETECTED Final   Streptococcus species NOT DETECTED NOT DETECTED Final   Streptococcus agalactiae NOT DETECTED NOT DETECTED Final   Streptococcus pneumoniae NOT DETECTED NOT DETECTED Final   Streptococcus pyogenes NOT DETECTED NOT DETECTED Final   A.calcoaceticus-baumannii NOT DETECTED NOT DETECTED Final   Bacteroides fragilis NOT DETECTED NOT DETECTED Final   Enterobacterales NOT DETECTED NOT DETECTED Final   Enterobacter cloacae complex NOT DETECTED NOT DETECTED Final   Escherichia coli NOT DETECTED NOT DETECTED Final   Klebsiella aerogenes NOT DETECTED NOT DETECTED Final   Klebsiella oxytoca NOT DETECTED NOT DETECTED Final   Klebsiella pneumoniae NOT DETECTED NOT DETECTED Final   Proteus species NOT DETECTED NOT DETECTED Final  Salmonella species NOT DETECTED NOT DETECTED Final   Serratia  marcescens NOT DETECTED NOT DETECTED Final   Haemophilus influenzae NOT DETECTED NOT DETECTED Final   Neisseria meningitidis NOT DETECTED NOT DETECTED Final   Pseudomonas aeruginosa NOT DETECTED NOT DETECTED Final   Stenotrophomonas maltophilia NOT DETECTED NOT DETECTED Final   Candida albicans NOT DETECTED NOT DETECTED Final   Candida auris NOT DETECTED NOT DETECTED Final   Candida glabrata NOT DETECTED NOT DETECTED Final   Candida krusei NOT DETECTED NOT DETECTED Final   Candida parapsilosis NOT DETECTED NOT DETECTED Final   Candida tropicalis NOT DETECTED NOT DETECTED Final   Cryptococcus neoformans/gattii NOT DETECTED NOT DETECTED Final    Comment: Performed at [facility], [address] [place], [place] [address]  MRSA Next Gen by PCR, Nasal     Status: None   Collection Time: 04/10/24  9:45 PM   Specimen: Nasal Mucosa; Nasal Swab  Result Value Ref Range Status   MRSA by PCR Next Gen NOT DETECTED NOT DETECTED Final    Comment: (NOTE) The GeneXpert MRSA Assay (FDA approved for NASAL specimens only), is one component of a comprehensive MRSA colonization surveillance program. It is not intended to diagnose MRSA infection nor to guide or monitor treatment for MRSA infections. Test performance is not FDA approved in patients less than 63 years old. Performed at [facility], [address] [place], [place] [address]     RADIOLOGY STUDIES/RESULTS: DG Chest Port 1 View Result Date: 04/11/2024 CLINICAL DATA:  Shortness of breath. Recent aspiration into the airway. EXAM: PORTABLE CHEST 1 VIEW COMPARISON:  04/10/2024 FINDINGS: Normal sized heart. Small amount of left basilar atelectasis and minimal patchy density. Otherwise, clear lungs with normal vascularity. Tortuous and partially calcified thoracic aorta. Thoracic spine degenerative changes. Diffuse osteopenia. IMPRESSION: Mild left basilar atelectasis and possible minimal pneumonia or aspiration pneumonitis.  Electronically Signed   By: Elspeth Bathe M.D.   On: 04/11/2024 10:40   Overnight EEG with video Result Date: 04/11/2024 Shelton Arlin KIDD, MD     04/12/2024  8:45 AM Patient Name: BRIANNY SOULLIERE MRN: [id number] Epilepsy Attending: Arlin KIDD Shelton Referring Physician/Provider: Remi Pippin, NP Duration: 04/10/2024 1039 to 04/11/2024 0936 Patient history: 78yo F with right frontal tumor and amd. EEG to evaluate for seizure Level of alertness: Awake/ lethargic, asleep AEDs during EEG study: LEV Technical aspects: This EEG study was done with scalp electrodes positioned according to the 10-20 International system of electrode placement. Electrical activity was reviewed with band pass filter of 1-70Hz , sensitivity of 7 uV/mm, display speed of 61mm/sec with a 60Hz  notched filter applied as appropriate. EEG data were recorded continuously and digitally stored.  Video monitoring was available and reviewed as appropriate. Description: No clear posterior dominant rhythm was seen. Sleep was characterized by sleep spindles (12 to 14 Hz), maximal frontocentral region.  There is continuous generalized 3 to 7 Hz theta-delta slowing. Hyperventilation and photic stimulation were not performed.   ABNORMALITY - Continuous slow, generalized IMPRESSION: This study is suggestive of generalized cerebral dysfunction (encephalopathy). No seizures or epileptiform discharges were seen throughout the recording. Priyanka KIDD Shelton      LOS: 10 days   Donalda Applebaum, MD  [facility]    To contact the attending provider between 7A-7P or the covering provider during after hours 7P-7A, please log into the web site [email or web address] and access using universal [place] password for that web site. If you do not have the password,  please call the hospital operator.  04/12/2024, 10:35 AM

## 2024-04-12 NOTE — Plan of Care (Signed)

## 2024-04-13 ENCOUNTER — Inpatient Hospital Stay (HOSPITAL_COMMUNITY)

## 2024-04-13 DIAGNOSIS — Z8673 Personal history of transient ischemic attack (TIA), and cerebral infarction without residual deficits: Secondary | ICD-10-CM | POA: Diagnosis not present

## 2024-04-13 DIAGNOSIS — J189 Pneumonia, unspecified organism: Secondary | ICD-10-CM | POA: Diagnosis not present

## 2024-04-13 DIAGNOSIS — R4182 Altered mental status, unspecified: Secondary | ICD-10-CM | POA: Diagnosis not present

## 2024-04-13 LAB — CULTURE, BLOOD (ROUTINE X 2)

## 2024-04-13 LAB — MAGNESIUM: Magnesium: 2.1 mg/dL (ref 1.7–2.4)

## 2024-04-13 LAB — BASIC METABOLIC PANEL WITH GFR
Anion gap: 13 (ref 5–15)
BUN: 20 mg/dL (ref 8–23)
CO2: 18 mmol/L — ABNORMAL LOW (ref 22–32)
Calcium: 8.3 mg/dL — ABNORMAL LOW (ref 8.9–10.3)
Chloride: 102 mmol/L (ref 98–111)
Creatinine, Ser: 1.32 mg/dL — ABNORMAL HIGH (ref 0.44–1.00)
GFR, Estimated: 41 mL/min — ABNORMAL LOW (ref >60)
Glucose, Bld: 93 mg/dL (ref 70–99)
Potassium: 4 mmol/L (ref 3.5–5.1)
Sodium: 133 mmol/L — ABNORMAL LOW (ref 135–145)

## 2024-04-13 MED ORDER — PANTOPRAZOLE SODIUM 40 MG PO TBEC
40.0000 mg | DELAYED_RELEASE_TABLET | Freq: Every day | ORAL | Status: DC
  Administered 2024-04-13: 40 mg via ORAL
  Filled 2024-04-13: qty 1

## 2024-04-13 MED ORDER — MELATONIN 5 MG PO TABS: 5.0000 mg | ORAL_TABLET | Freq: Every evening | ORAL | Status: DC | PRN

## 2024-04-13 MED ORDER — ROSUVASTATIN CALCIUM 20 MG PO TABS
20.0000 mg | ORAL_TABLET | Freq: Every day | ORAL | Status: DC
  Administered 2024-04-13: 20 mg via ORAL
  Filled 2024-04-13: qty 1

## 2024-04-13 MED ORDER — AMOXICILLIN-POT CLAVULANATE 400-57 MG/5ML PO SUSR: 800.0000 mg | Freq: Two times a day (BID) | ORAL | Status: AC

## 2024-04-13 MED ORDER — ACETAMINOPHEN 500 MG PO TABS: 500.0000 mg | ORAL_TABLET | Freq: Four times a day (QID) | ORAL | Status: DC | PRN

## 2024-04-13 MED ORDER — DONEPEZIL HCL 10 MG PO TABS
10.0000 mg | ORAL_TABLET | Freq: Every day | ORAL | Status: DC
  Administered 2024-04-13: 10 mg via ORAL
  Filled 2024-04-13: qty 1

## 2024-04-13 MED ORDER — AMOXICILLIN-POT CLAVULANATE 400-57 MG/5ML PO SUSR
800.0000 mg | Freq: Two times a day (BID) | ORAL | Status: DC
  Administered 2024-04-13: 800 mg via ORAL
  Filled 2024-04-13 (×2): qty 10

## 2024-04-13 NOTE — Discharge Summary (Signed)
 PATIENT DETAILS Name: [name] Age: 78 y.o. Sex: female Date of Birth: [birth year] MRN: [id number]. Admitting Physician: Terry LOISE Hurst, DO ERE:Xpoejumprx, Zachary, MD  Admit Date: 04/02/2024 Discharge date: 04/13/2024  Recommendations for Outpatient Follow-up:  Follow up with PCP in 1-2 weeks Please obtain CMP/CBC in one week Please ensure follow up with Palliative care at SNF Please ensure follow-up with neuro-oncology-Dr. Buckley  Admitted From:  SNF  Disposition: Skilled nursing facility   Discharge Condition: fair  CODE STATUS:   Code Status: Limited: Do not attempt resuscitation (DNR) -DNR-LIMITED -Do Not Intubate/DNI    Diet recommendation:  Diet Order             Diet - low sodium heart healthy           DIET DYS 3 Room service appropriate? No; Fluid consistency: Thin  Diet effective now                    Brief Summary: Patient is a 78 y.o.  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  She was brought to the ED on 11/17-for altered mental status-was found to have acute metabolic encephalopathy in the setting of PNA.  She was admitted and started on broad-spectrum IV antibiotics-she clinically improved-but on 11/25 she had another episode of unresponsiveness/encephalopathy-she was febrile-unfortunately-appears that she had another episode of aspiration pneumonia-she was restarted on antibiotics with clinical improvement.   Significant events: 11/17>> admit to [facility]. 11/25>> barely responsive-febrile-urgent neurology consultation-stat CT head/ABG ordered.  Placed on LTM EEG by neurology.  Empirically on  Vanco/Zosyn  11/26>> remarkable improvement-much more awake/alert.  No seizures on LTM EEG.    Significant studies: 11/17>> CT head: Persistent hypodensity/mild edema right frontoparietal operculum 11/17>> CXR: Left> right airspace disease-atelectasis versus infection versus aspiration. 11/18>> EEG: No seizures. 11/25>> CT head: No acute intracranial abnormality.  Decreased edema in the region of the right frontoparietal operculum/thalamus. 11/25-11/26>> LTM EEG: No seizures. 11/26>> chest x-ray: Left sided pneumonia   Significant microbiology data: 11/17>> COVID/influenza/RSV PCR: Negative 11/17>> blood culture: No growth 11/18>> blood culture: No growth 11/25>> blood culture 1/2-Staph warneri   Procedures: None   Consults: Palliative Neuro Oncology Neurology  Brief Hospital Course: Acute metabolic encephalopathy superimposed on dementia/delirium Secondary to sepsis physiology/aspiration pneumonia-had improved after treatment of underlying etiologies but markedly deteriorated on 11/25-with unresponsiveness.  Suspect etiology for encephalopathy on 11/25 was probable aspiration pneumonia, no evidence of seizures on LTM EEG.  She was started on empiric antibiotics and has remarkably improved Remains on AEDs.   Sepsis secondary to presumed aspiration pneumonia (likely related to recent dysphagia in the setting of CVA/right frontal lobe lesion) Sepsis already had resolved-patient had completed a course of antibiotics on 11/22 but redeveloped sepsis physiology/encephalopathy with fever on 11/25-started on Zosyn -clinically improved-Will switch to Augmentin  today-and continue on discharge to complete a 7-day course of antibiotics.   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 related to dysphagia 3 diet-watch  closely. Please ensure follow-up with SLP at SNF.   AKI Mild Likely hemodynamically mediated Follow electrolytes periodically Avoid nephrotoxic agents.   Hyponatremia Mild Likely of no clinical significance Follow electrolytes periodically  Hypokalemia Repleted-recheck electrolytes in 1 week   Seizure Likely related to underlying right frontal lobe lesion Continue Keppra  Initially-encephalopathy was felt to be related to aspiration pneumonia-however due to significant deterioration in 11/25-LTM EEG was started-no seizures. 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     HTN BP stable-all antihypertensives currently on hold-resume when able.   HLD resume statin.    Polycythemia Mild Unclear etiology Stable for periodic follow-up.   GERD PPI   Dementia/cognitive dysfunction Resume Aricept    Palliative care Has had some waxing/waning delirium-but became markedly worse on 11/25 with severe encephalopathy-essentially obtunded.  Suspect decompensation on 11/25 was likely due to sepsis related to aspiration pneumonia-no evidence of seizures.  Although has a low-grade glioma-suspect she remains at risk for aspiration issues/breakthrough seizures-which is probably more secondary to her underlying dementia/frailty and advanced age.  Long discussion with both sons by this MD on 11/25-family discussion in progress-they understand that patient will likely continue to have issues with breakthrough  seizures/encephalopathy/aspiration pneumonia in the future.  Thankfully with antibiotics-patient's encephalopathy has improved-we will continue supportive care-continue to engage family.  Will need close outpatient follow-up with palliative care at SNF.   Class 2 Obesity: Estimated body mass index is 35.34 kg/m as calculated from the following:   Height as of 02/26/24: 5' 4 (1.626 m).   Weight as of this encounter: 93.4 kg.    Discharge Diagnoses:  Principal Problem:   AMS (altered mental status)   Discharge Instructions:  Activity:  As tolerated with Full fall precautions use walker/cane & assistance as needed  Discharge Instructions     Call MD for:  difficulty breathing, headache or visual disturbances   Complete by: As directed    Call MD for:  extreme fatigue   Complete by: As directed    Diet - low sodium heart healthy   Complete by: As directed    Discharge instructions   Complete by: As directed    Follow with Primary MD  Hillman Bare, MD in 1-2 weeks  Please get a complete blood count and chemistry panel checked by your Primary MD at your next visit, and again as instructed by your Primary MD.  Get Medicines reviewed and adjusted: Please take all your medications with you for your next visit with your Primary MD  Laboratory/radiological data: Please request your Primary MD to go over all hospital tests and procedure/radiological results at the follow up, please ask your Primary MD to get all Hospital records sent to his/her office.  In some cases, they will be blood work, cultures and biopsy results pending at the time of your discharge. Please request that your primary care M.D. follows up on these results.  Also Note the following: If you experience worsening of your admission symptoms, develop shortness of breath, life threatening emergency, suicidal or homicidal thoughts you must seek medical attention immediately by calling 911 or calling your MD  immediately  if symptoms less severe.  You must read complete instructions/literature along with all the possible adverse reactions/side effects for all the Medicines you take and that have been prescribed to you. Take any new Medicines after you have completely understood and accpet all the possible adverse  reactions/side effects.   Do not drive when taking Pain medications or sleeping medications (Benzodaizepines)  Do not take more than prescribed Pain, Sleep and Anxiety Medications. It is not advisable to combine anxiety,sleep and pain medications without talking with your primary care practitioner  Special Instructions: If you have smoked or chewed Tobacco  in the last 2 yrs please stop smoking, stop any regular Alcohol  and or any Recreational drug use.  Wear Seat belts while driving.  Please note: You were cared for by a hospitalist during your hospital stay. Once you are discharged, your primary care physician will handle any further medical issues. Please note that NO REFILLS for any discharge medications will be authorized once you are discharged, as it is imperative that you return to your primary care physician (or establish a relationship with a primary care physician if you do not have one) for your post hospital discharge needs so that they can reassess your need for medications and monitor your lab values.   Increase activity slowly   Complete by: As directed    No wound care   Complete by: As directed       Allergies as of 04/13/2024       Reactions   Shellfish Protein-containing Drug Products Other (See Comments)   Shrimp and lobster--severe swelling/itching   Carisoprodol-aspirin -codeine Other (See Comments)   Unknown    Codeine Nausea And Vomiting   Doxycycline Nausea Only   Soma [carisoprodol] Other (See Comments)   Unknown and not listed on MAR.   Tetracycline Nausea Only        Medication List     STOP taking these medications    amLODipine  10 MG  tablet Commonly known as: NORVASC    losartan -hydrochlorothiazide  50-12.5 MG tablet Commonly known as: HYZAAR   traMADol  HCl 25 MG Tabs       TAKE these medications    acetaminophen  500 MG tablet Commonly known as: TYLENOL  Take 1 tablet (500 mg total) by mouth every 6 (six) hours as needed for mild pain (pain score 1-3), fever or headache. What changed:  medication strength how much to take when to take this reasons to take this   albuterol  108 (90 Base) MCG/ACT inhaler Commonly known as: VENTOLIN  HFA Inhale 2 puffs into the lungs every 6 (six) hours as needed for wheezing or shortness of breath.   amoxicillin -clavulanate 400-57 MG/5ML suspension Commonly known as: AUGMENTIN  Take 10 mLs (800 mg total) by mouth every 12 (twelve) hours for 4 days.   diclofenac Sodium 1 % Gel Commonly known as: VOLTAREN Apply 1 g topically 2 (two) times daily.   donepezil  10 MG tablet Commonly known as: ARICEPT  Take 10 mg by mouth daily.   levETIRAcetam  500 MG tablet Commonly known as: KEPPRA  Take 1 tablet (500 mg total) by mouth 2 (two) times daily.   lidocaine  4 % Place 1 patch onto the skin daily. Apply to affected area topically in the morning for pain.   melatonin 5 MG Tabs Take 1 tablet (5 mg total) by mouth at bedtime as needed.   omeprazole 40 MG capsule Commonly known as: PRILOSEC Take 40 mg by mouth daily.   prednisoLONE  acetate 1 % ophthalmic suspension Commonly known as: PRED FORTE  Place 1 drop into the right eye 4 (four) times daily.   rosuvastatin  20 MG tablet Commonly known as: CRESTOR  Take 20 mg by mouth daily.        Follow-up Information     Hillman Bare, MD. Schedule an  appointment as soon as possible for a visit in 1 week(s).   Specialty: Pulmonary Disease Contact information: [address] [place] [address] [phone]         Vaslow, Zachary K, MD Follow up.   Specialties: Psychiatry, Neurology, Oncology Why: Office will  call with date/time, If you dont hear from them,please give them a call Contact information: [address] [place] [address] [phone]                Allergies  Allergen Reactions   Shellfish Protein-Containing Drug Products Other (See Comments)    Shrimp and lobster--severe swelling/itching   Carisoprodol-Aspirin -Codeine Other (See Comments)    Unknown    Codeine Nausea And Vomiting   Doxycycline Nausea Only   Soma [Carisoprodol] Other (See Comments)    Unknown and not listed on MAR.   Tetracycline Nausea Only     Other Procedures/Studies: DG Chest Port 1 View Result Date: 04/11/2024 CLINICAL DATA:  Shortness of breath. Recent aspiration into the airway. EXAM: PORTABLE CHEST 1 VIEW COMPARISON:  04/10/2024 FINDINGS: Normal sized heart. Small amount of left basilar atelectasis and minimal patchy density. Otherwise, clear lungs with normal vascularity. Tortuous and partially calcified thoracic aorta. Thoracic spine degenerative changes. Diffuse osteopenia. IMPRESSION: Mild left basilar atelectasis and possible minimal pneumonia or aspiration pneumonitis. Electronically Signed   By: Elspeth Bathe M.D.   On: 04/11/2024 10:40   Overnight EEG with video Result Date: 04/11/2024 Shelton Arlin KIDD, MD     04/12/2024  8:45 AM Patient Name: [name] MRN: [id number] Epilepsy Attending: Arlin KIDD Shelton Referring Physician/Provider: Remi Pippin, NP Duration: 04/10/2024 1039 to 04/11/2024 0936 Patient history: 78yo F with right frontal tumor and amd. EEG to evaluate for seizure Level of alertness: Awake/ lethargic, asleep AEDs during EEG study: LEV Technical aspects: This EEG study was done with scalp electrodes positioned according to the 10-20 International system of electrode placement. Electrical activity was reviewed with band pass filter of 1-70Hz , sensitivity of 7 uV/mm, display speed of 67mm/sec with a 60Hz  notched filter applied as appropriate. EEG data were recorded  continuously and digitally stored.  Video monitoring was available and reviewed as appropriate. Description: No clear posterior dominant rhythm was seen. Sleep was characterized by sleep spindles (12 to 14 Hz), maximal frontocentral region.  There is continuous generalized 3 to 7 Hz theta-delta slowing. Hyperventilation and photic stimulation were not performed.   ABNORMALITY - Continuous slow, generalized IMPRESSION: This study is suggestive of generalized cerebral dysfunction (encephalopathy). No seizures or epileptiform discharges were seen throughout the recording. Arlin KIDD Shelton   DG CHEST PORT 1 VIEW Result Date: 04/10/2024 CLINICAL DATA:  [id number] Aspiration into airway [id number] EXAM: PORTABLE CHEST - 1 VIEW COMPARISON:  04/02/2024 FINDINGS: Low lung volumes. No focal airspace consolidation, pleural effusion, or pneumothorax. No cardiomegaly. Tortuous aorta with aortic atherosclerosis. No acute fracture or destructive lesions. Multilevel thoracic osteophytosis. IMPRESSION: Low lung volumes.  Otherwise, no acute cardiopulmonary abnormality. Electronically Signed   By: Rogelia Myers M.D.   On: 04/10/2024 10:34   CT HEAD WO CONTRAST ( ) Result Date: 04/10/2024 EXAM: CT HEAD WITHOUT 04/10/2024 08:49:16 AM TECHNIQUE: CT of the head was performed without the administration of intravenous contrast. Automated exposure control, iterative reconstruction, and/or weight based adjustment of the mA/kV was utilized to reduce the radiation dose to as low as reasonably achievable. COMPARISON: Head CT 04/02/2024 and MRI 03/23/2024. CLINICAL HISTORY: Mental status change, unknown cause. FINDINGS: BRAIN AND VENTRICLES: Hypodensity/mild edema in  the right frontoparietal operculum on the prior CT has mildly decreased. A smaller region of hypodensity involving the right thalamus has also likely slightly decreased. No acute intracranial hemorrhage, acute large territory infarct, midline shift, hydrocephalus, or  extra-axial fluid collection is identified. Confluent hypodensities elsewhere in the cerebral white matter bilaterally are unchanged and nonspecific but compatible with severe chronic small vessel ischemic disease. Chronic heterogeneity is again noted throughout the deep gray nuclei. There is mild cerebral atrophy. Calcified atherosclerosis at the skull base. ORBITS: Bilateral cataract extraction. SINUSES AND MASTOIDS: New small fluid levels in the sphenoid sinuses. New mild mucosal thickening scattered throughout the paranasal sinuses. Persistent small right mastoid effusion. SOFT TISSUES AND SKULL: No acute skull fracture. No acute soft tissue abnormality. IMPRESSION: 1. Mildly decreased edema in the region of the right frontoparietal operculum and thalamus. 2. No acute intracranial abnormality. 3. Severe chronic small vessel ischemic disease. 4. New inflammatory disease in the paranasal sinuses including fluid in the sphenoid sinuses. Correlate for acute sinusitis. Electronically signed by: Dasie Hamburg MD 04/10/2024 09:04 AM EST RP Workstation: [id number]   EEG adult Result Date: 04/03/2024 Shelton Arlin KIDD, MD     04/03/2024  2:43 PM Patient Name: [name] MRN: [id number] Epilepsy Attending: Arlin KIDD Shelton Referring Physician/Provider: Shona Terry SAILOR, DO Date: 04/03/2024 Duration: 22.39 mins Patient history: 78 year old female with altered mental status.  EEG to evaluate for seizure. Level of alertness: Awake AEDs during EEG study: Keppra  Technical aspects: This EEG study was done with scalp electrodes positioned according to the 10-20 International system of electrode placement. Electrical activity was reviewed with band pass filter of 1-70Hz , sensitivity of 7 uV/mm, display speed of 81mm/sec with a 60Hz  notched filter applied as appropriate. EEG data were recorded continuously and digitally stored.  Video monitoring was available and reviewed as appropriate. Description: EEG showed continuous  generalized 3 to 7 Hz theta-delta slowing. Hyperventilation and photic stimulation were not performed.   ABNORMALITY - Continuous slow, generalized IMPRESSION: This study is suggestive of generalized cerebral dysfunction (encephalopathy). No seizures or epileptiform discharges were seen throughout the recording. Arlin KIDD Shelton   DG Chest Portable 1 View Result Date: 04/02/2024 EXAM: 1 AP VIEW(S) XRAY OF THE CHEST 04/02/2024 04:10:00 PM COMPARISON: 02/25/2019 CLINICAL HISTORY: ?pna pna FINDINGS: LUNGS AND PLEURA: Lower lung predominant mild interstitial thickening is nonspecific. Left greater than right basilar airspace disease. No pleural effusion. No pneumothorax. HEART AND MEDIASTINUM: Normal heart size for AP portable technique. Patient rotated minimally right. BONES AND SOFT TISSUES: No acute osseous abnormality. Exam limited by AP portable technique and patient body habitus. IMPRESSION: 1. Limited AP portable radiographs due to patient body habitus 2. Left greater than right base airspace disease. This could represent atelectasis. especially at the left lung base, infection or aspiration cannot be excluded. Consider PA and lateral radiographs if possible, Electronically signed by: Rockey Kilts MD 04/02/2024 04:42 PM EST RP Workstation: [id number]   CT HEAD WO CONTRAST Result Date: 04/02/2024 EXAM: CT HEAD WITHOUT CONTRAST 04/02/2024 03:56:34 PM TECHNIQUE: CT of the head was performed without the administration of intravenous contrast. Automated exposure control, iterative reconstruction, and/or weight based adjustment of the mA/kV was utilized to reduce the radiation dose to as low as reasonably achievable. COMPARISON: Head CT and MRI 03/23/2024. CLINICAL HISTORY: Neuro deficit, acute, stroke suspected. Altered mental status. History of seizures. FINDINGS: BRAIN AND VENTRICLES: There is persistent hypodensity/mild edema in the right frontoparietal operculum corresponding to the abnormal T2  hyperintensity and enhancement on the  recent prior MRI. Small mildly hyperdense hemorrhage in this region on the prior CT is no longer clearly evident. No new intracranial hemorrhage, acute large territory infarct, midline shift, hydrocephalus, or extra-axial fluid collection is identified. Confluent hypodensities elsewhere in the cerebral white matter bilaterally are similar to the prior CT and nonspecific but compatible with extensive chronic small vessel ischemic disease. There is heterogeneous hypodensity in the deep gray nuclei bilaterally, including in the right thalamus where enhancement was shown on the recent MRI. There is mild cerebral atrophy. Calcified atherosclerosis at the skull base. ORBITS: Bilateral cataract extraction. SINUSES: Small right mastoid effusion. The included paranasal sinuses are clear. SOFT TISSUES AND SKULL: No acute soft tissue abnormality. No skull fracture. IMPRESSION: 1. Persistent hypodensity/mild edema in the right frontoparietal operculum corresponding to signal abnormality on the recent MRI where concern was raised for possible infiltrative primary tumor. 2. No evidence of a new intracranial abnormality 3. Extensive chronic small vessel ischemic disease. Electronically signed by: Dasie Hamburg MD 04/02/2024 04:32 PM EST RP Workstation: [id number]   DG Swallowing Func-Speech Pathology Result Date: 03/26/2024 Table formatting from the original result was not included. Modified Barium Swallow Study Patient Details Name: LILLYONA POLASEK MRN: [id number] Date of Birth: February 25, 1946 Today's Date: 03/26/2024 HPI/PMH: HPI: 78 yo female with history of HTN, osteoarthritis, and memory loss who presents after sudden seizure activity with sudden onset L sided weakness. CTH shows R opercular acute parenchymal hemorrhage with adjacent cytotoxic edema. MRI suspicious for infiltrative high-grade tumor as opposed to subacute ischemia. Clinical Impression: Clinical Impression: Pt exhibits moderate  pharyngeal dysphagia characterized by mistiming resulting in trace penetration that reaches the vocal folds with thin and nectar thick liquids (PAS 5). A volitional cough clears penetrates completely. Boluses pool in the open laryngeal vestibule and pyriform sinuses during the swallow and while complete laryngeal closure expels the majority of penetrates, trace liquids remain on the vocal folds. A chin tuck did not benefit laryngeal closure before the swallow. No penetration/aspiration occurred with honey thick liquids, purees, or solids but did increase the volume of pharyngeal residue. There is overall mild oral residue and pt has previously shown the ability to clear with a lingual sweep given Min cueing. Carryover of recommendations is limited so pt will require full supervision with initiation of Dys 3 diet and thin liquids. Cue pt to cough intermittently after drinking and use a lingual sweep as needed. Will continue following. Factors that may increase risk of adverse event in presence of aspiration Noe & Lianne 2021): Factors that may increase risk of adverse event in presence of aspiration Noe & Lianne 2021): Reduced cognitive function; Limited mobility Recommendations/Plan: Swallowing Evaluation Recommendations Swallowing Evaluation Recommendations Recommendations: PO diet PO Diet Recommendation: Dysphagia 3 (Mechanical soft); Thin liquids (Level 0) Liquid Administration via: Cup; Straw Medication Administration: Whole meds with puree Supervision: Full assist for feeding; Full supervision/cueing for swallowing strategies Swallowing strategies  : Slow rate; Small bites/sips; Hard cough after swallowing Postural changes: Position pt fully upright for meals; Stay upright 30-60 min after meals Oral care recommendations: Oral care BID (2x/day); Oral care before PO; Staff/trained caregiver to provide oral care Treatment Plan Treatment Plan Treatment recommendations: Therapy as outlined in treatment  plan below Follow-up recommendations: Skilled nursing-short term rehab (<3 hours/day) Functional status assessment: Patient has had a recent decline in their functional status and demonstrates the ability to make significant improvements in function in a reasonable and predictable amount of time. Treatment frequency: Min 2x/week Treatment duration: 2 weeks Interventions:  Aspiration precaution training; Patient/family education; Trials of upgraded texture/liquids; Diet toleration management by SLP Recommendations Recommendations for follow up therapy are one component of a multi-disciplinary discharge planning process, led by the attending physician.  Recommendations may be updated based on patient status, additional functional criteria and insurance authorization. 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 Boluses Administered: Boluses Administered Boluses Administered: Thin liquids (Level 0); Mildly thick liquids (Level 2, nectar thick); Moderately thick liquids (Level 3, honey thick); Puree; Solid  Oral Impairment Domain: Oral Impairment Domain Lip Closure: Escape beyond mid-chin Tongue control during bolus hold: Cohesive bolus between tongue to palatal seal Bolus preparation/mastication: Timely and efficient chewing and mashing Bolus transport/lingual motion: Delayed initiation of tongue motion (oral holding) Oral residue: Trace residue lining oral structures Location of oral residue : Tongue Initiation of pharyngeal swallow : Pyriform sinuses  Pharyngeal Impairment Domain: Pharyngeal Impairment Domain Soft palate elevation: No bolus between soft  palate (SP)/pharyngeal wall (PW) Laryngeal elevation: Complete superior movement of thyroid cartilage with complete approximation of arytenoids to epiglottic petiole Anterior hyoid excursion: Complete anterior movement Epiglottic movement: Complete inversion Laryngeal vestibule closure: Complete, no air/contrast in laryngeal vestibule Pharyngeal stripping wave : Present - complete Pharyngeal contraction (A/P view only): N/A Pharyngoesophageal segment opening: Complete distension and complete duration, no obstruction of flow Tongue base retraction: No contrast between tongue base and posterior pharyngeal wall (PPW) Pharyngeal residue: Trace residue within or on pharyngeal structures Location of pharyngeal residue: Tongue base; Valleculae  Esophageal Impairment Domain: No data recorded Pill: No data recorded Penetration/Aspiration Scale Score: Penetration/Aspiration Scale Score 1.  Material does not enter airway: Moderately thick liquids (Level 3, honey thick); Puree; Solid 5.  Material enters airway, CONTACTS cords and not ejected out: Thin liquids (Level 0); Mildly thick liquids (Level 2, nectar thick) Compensatory Strategies: Compensatory Strategies Compensatory strategies: Yes Chin tuck: Ineffective Ineffective Chin Tuck: Thin liquid (Level 0)   General Information: Caregiver present: Yes  Diet Prior to this Study: NPO   Temperature : Normal   Respiratory Status: WFL   Supplemental O2: None (Room air)   History of Recent Intubation: No  Behavior/Cognition: Alert; Cooperative Self-Feeding Abilities: Able to self-feed Baseline vocal quality/speech: Normal Volitional Cough: Able to elicit Volitional Swallow: Able to elicit Exam Limitations: No limitations Goal Planning: Prognosis for improved oropharyngeal function: Good Barriers to Reach Goals: Cognitive deficits No data recorded Patient/Family 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 Consulted and agree with results and recommendations: Patient; Nurse Pain: Pain  Assessment Pain Assessment: No/denies pain Faces Pain Scale: 0 Breathing: 0 Negative Vocalization: 1 Facial Expression: 1 Body Language: 1 Consolability: 0 PAINAD Score: 3 Pain Intervention(s): Monitored during session End of Session: Start Time:SLP Start Time (ACUTE ONLY): 1218 Stop Time: SLP Stop Time (ACUTE ONLY): 1245 Time Calculation:SLP Time Calculation (min) (ACUTE ONLY): 27 min Charges: SLP Evaluations $ SLP Speech Visit: 1 Visit SLP Evaluations $MBS Swallow: 1 Procedure $Swallowing Treatment: 1 Procedure SLP visit diagnosis: SLP Visit Diagnosis: Dysphagia, oropharyngeal phase (R13.12) Past Medical History: Past Medical History: Diagnosis Date  Hypertension  Past Surgical History: Past Surgical History: Procedure Laterality Date  BREAST EXCISIONAL BIOPSY Bilateral   MEDIAL PARTIAL KNEE REPLACEMENT Bilateral 06/07/2012 Damien  Dannielle DEAN., CCC-SLP Speech Language Pathology, Acute Rehabilitation Services Secure Chat preferred [phone] 03/26/2024, 1:50 PM  MR BRAIN W WO CONTRAST Addendum Date: 03/24/2024 Study discussed by telephone with Dr. Rosemarie by Dr. Shona at 08:55 hours on 03/23/2024. Study reviewed by Dr. Shona and Dr. Joshua. Postcontrast images also included on this exam, demonstrating patchy gyriform enhancement within the abnormal right frontal operculum, but also confluent suspicious enhancement in the right thalamus, 2 cm area series 17 image 16. Questionable also abnormal enhancement in the right superior frontal gyrus such as on sagittal postcontrast series 18 image 9, although axial images suggest this might be a developmental venous anomaly instead (benign). Despite absence of regional mass effect, the constellation of findings raises the possibility of infiltrative primary tumor. There are extensive bilateral deep gray nuclei small vessel disease changes. But Furthermore, Dr. Rosemarie advises that the patient presentation is seizure, and is not stroke-like symptoms. Therefore, constellation of  clinical and MRI findings is more suspicious for infiltrative high grade tumor than subacute ischemia. If biopsy is not done then recommend a short interval repeat brain MRI in 4 to 6 weeks to reevaluate. ---------------------------------------------------- Electronically signed by: Gilmore Joshua MD 03/24/2024 04:55 PM EST RP Workstation: [id number]     CT ANGIO HEAD NECK W WO CM Result Date: 03/23/2024 EXAM: CTA HEAD AND NECK WITH AND WITHOUT 03/23/2024 05:33:00 PM TECHNIQUE: CTA of the head and neck was performed with and without the administration of intravenous contrast. Multiplanar 2D and/or 3D reformatted images are provided for review. Automated exposure control, iterative reconstruction, and/or weight based adjustment of the mA/kV was utilized to reduce the radiation dose to as low as reasonably achievable. Stenosis of the internal carotid arteries measured using NASCET criteria. COMPARISON: Same day CT head. CLINICAL HISTORY: Stroke, hemorrhagic FINDINGS: AORTIC ARCH AND ARCH VESSELS: No dissection or arterial injury. No significant stenosis of the brachiocephalic or subclavian arteries. CERVICAL CAROTID ARTERIES: No dissection, arterial injury, or hemodynamically significant stenosis by NASCET criteria. CERVICAL VERTEBRAL ARTERIES: No dissection, arterial injury, or significant stenosis. LUNGS AND MEDIASTINUM: Unremarkable. SOFT TISSUES: No acute abnormality. BONES: No acute abnormality. ANTERIOR CIRCULATION: No significant stenosis of the internal carotid arteries. No significant stenosis of the anterior cerebral arteries. No significant stenosis of the middle cerebral arteries. No aneurysm. POSTERIOR CIRCULATION: No significant stenosis of the posterior cerebral arteries. No significant stenosis of the basilar artery. No significant stenosis of the vertebral arteries. No aneurysm. OTHER: No dural venous sinus thrombosis on this non-dedicated study. IMPRESSION: 1. No large vessel occlusion,  hemodynamically significant stenosis, or aneurysm in the head or neck. Electronically signed by: Gilmore Joshua MD 03/23/2024 08:22 PM EST RP Workstation: [id number]   CT HEAD POST STROKE FOLLOWUP/TIMED/STAT READ Result Date: 03/23/2024 EXAM: CT HEAD WITHOUT 03/23/2024 05:33:00 PM TECHNIQUE: CT of the head was performed without the administration of intravenous contrast. Automated exposure control, iterative reconstruction, and/or weight based adjustment of the mA/kV was utilized to reduce the radiation dose to as low as reasonably achievable. COMPARISON: Earlier same day CT head. CLINICAL HISTORY: Neuro deficit, acute, stroke suspected. FINDINGS: BRAIN AND VENTRICLES: Redemonstrated small focus of parenchymal hemorrhage within the right frontal operculum which appears slightly more indistinct in margin but otherwise similar in size. There is additional sulcal hyperattenuation along the right sylvian fissure favored to reflect slow flow versus thrombus within a branch of the right MCA without evidence of additional parenchymal hemorrhage in this region. Similar chronic microvascular ischemic changes and parenchymal volume loss. No midline shift. The basilar  cisterns are patent. Similar appearance of adjacent cytotoxic edema within the right frontal operculum. No extra-axial fluid collection. No hydrocephalus. ORBITS: Bilateral lens replacements. SINUSES AND MASTOIDS: No acute abnormality. SOFT TISSUES AND SKULL: No acute skull fracture. No acute soft tissue abnormality. IMPRESSION: 1. Small focus of parenchymal hemorrhage within the right frontal operculum, margins slightly more indistinct but similar in size compared to prior study. 2. Additional sulcal hyperattenuation along the right sylvian fissure, favored slow flow versus thrombus within a right MCA branch. 3. No new parenchymal hemorrhage. 4. Similar adjacent cytotoxic edema within the right frontal operculum. Electronically signed by: Donnice Mania MD  03/23/2024 05:51 PM EST RP Workstation: [id number]   ECHOCARDIOGRAM COMPLETE Result Date: 03/23/2024    ECHOCARDIOGRAM REPORT   Patient Name:   MYALYNN LINGLE Date of Exam: 03/23/2024 Medical Rec #:  [id number]     Height:       64.0 in Accession #:    [id number]    Weight:       205.9 lb Date of Birth:  June 19, 1945    BSA:          1.981 m Patient Age:    78 years      BP:           130/72 mmHg Patient Gender: F             HR:           84 bpm. Exam Location:  Inpatient Procedure: 2D Echo, Cardiac Doppler and Color Doppler (Both Spectral and Color            Flow Doppler were utilized during procedure). Indications:    Stroke I63.9  History:        Patient has no prior history of Echocardiogram examinations.                 Risk Factors:Hypertension and Dyslipidemia.  Sonographer:    Merlynn Argyle Referring Phys: [id number] CORTNEY E DE LA TORRE IMPRESSIONS  1. Left ventricular ejection fraction, by estimation, is 60 to 65%. The left ventricle has normal function. The left ventricle has no regional wall motion abnormalities. Left ventricular diastolic parameters were normal.  2. Right ventricular systolic function is normal. The right ventricular size is mildly enlarged. There is normal pulmonary artery systolic pressure. The estimated right ventricular systolic pressure is 24.2 mmHg.  3. The mitral valve is grossly normal. Mild mitral valve regurgitation. No evidence of mitral stenosis.  4. The aortic valve is tricuspid. Aortic valve regurgitation is not visualized. Aortic valve sclerosis is present, with no evidence of aortic valve stenosis.  5. The inferior vena cava is normal in size with greater than 50% respiratory variability, suggesting right atrial pressure of 3 mmHg. Comparison(s): No prior Echocardiogram. Conclusion(s)/Recommendation(s): No intracardiac source of embolism detected on this transthoracic study. Consider a transesophageal echocardiogram to exclude cardiac source of embolism if clinically  indicated. FINDINGS  Left Ventricle: Left ventricular ejection fraction, by estimation, is 60 to 65%. The left ventricle has normal function. The left ventricle has no regional wall motion abnormalities. The left ventricular internal cavity size was normal in size. There is  no left ventricular hypertrophy. Left ventricular diastolic parameters were normal. Right Ventricle: The right ventricular size is mildly enlarged. Right vetricular wall thickness was not well visualized. Right ventricular systolic function is normal. There is normal pulmonary artery systolic pressure. The tricuspid regurgitant velocity  is 2.30 m/s, and with an assumed right atrial pressure of 3 mmHg, the  estimated right ventricular systolic pressure is 24.2 mmHg. Left Atrium: Left atrial size was normal in size. Right Atrium: Right atrial size was normal in size. Pericardium: There is no evidence of pericardial effusion. Mitral Valve: The mitral valve is grossly normal. Mild mitral valve regurgitation. No evidence of mitral valve stenosis. MV peak gradient, 5.1 mmHg. The mean mitral valve gradient is 3.0 mmHg. Tricuspid Valve: The tricuspid valve is normal in structure. Tricuspid valve regurgitation is mild . No evidence of tricuspid stenosis. Aortic Valve: The aortic valve is tricuspid. Aortic valve regurgitation is not visualized. Aortic valve sclerosis is present, with no evidence of aortic valve stenosis. Pulmonic Valve: The pulmonic valve was not well visualized. Pulmonic valve regurgitation is trivial. No evidence of pulmonic stenosis. Aorta: The aortic root and ascending aorta are structurally normal, with no evidence of dilitation. Venous: The inferior vena cava is normal in size with greater than 50% respiratory variability, suggesting right atrial pressure of 3 mmHg. IAS/Shunts: The atrial septum is grossly normal.  LEFT VENTRICLE PLAX 2D LVIDd:         3.60 cm   Diastology LVIDs:         2.60 cm   LV e' medial:  11.00 cm/s LV PW:          1.00 cm   LV e' lateral: 14.00 cm/s LV IVS:        0.90 cm LVOT diam:     1.70 cm LV SV:         28 LV SV Index:   14 LVOT Area:     2.27 cm LV IVRT:       109 msec  RIGHT VENTRICLE             IVC RV Basal diam:  4.90 cm     IVC diam: 1.80 cm RV Mid diam:    4.00 cm RV S prime:     12.00 cm/s TAPSE (M-mode): 2.5 cm LEFT ATRIUM             Index        RIGHT ATRIUM           Index LA diam:        2.40 cm 1.21 cm/m   RA Area:     14.70 cm LA Vol (A2C):   51.9 ml 26.20 ml/m  RA Volume:   42.30 ml  21.36 ml/m LA Vol (A4C):   17.8 ml 8.99 ml/m LA Biplane Vol: 31.0 ml 15.65 ml/m  AORTIC VALVE             PULMONIC VALVE LVOT Vmax:   64.30 cm/s  PR End Diast Vel: 6.76 msec LVOT Vmean:  41.800 cm/s LVOT VTI:    0.124 m  AORTA Ao Root diam: 3.10 cm Ao Asc diam:  3.40 cm MITRAL VALVE             TRICUSPID VALVE MV Area VTI:  1.33 cm   TR Peak grad:   21.2 mmHg MV Peak grad: 5.1 mmHg   TR Vmax:        230.00 cm/s MV Mean grad: 3.0 mmHg MV Vmax:      1.13 m/s   SHUNTS MV Vmean:     84.8 cm/s  Systemic VTI:  0.12 m                          Systemic Diam: 1.70 cm [employer] Product Manager signed by Madonna Large  Signature Date/Time: 03/23/2024/4:25:15 PM    Final    EEG adult Result Date: 03/23/2024 Shelton Arlin KIDD, MD     03/23/2024  4:10 PM Patient Name: [name] MRN: [id number] Epilepsy Attending: Arlin KIDD Shelton Referring Physician/Provider: Merrianne Locus, MD Date: 03/23/2024 Duration: 36.55 mins Patient history: 78 y.o. female with hx of hypertension, osteoarthritis and memory loss who presents after sudden seizure activity and sudden onset left-sided weakness. EEG to evaluate for seizure Level of alertness: Awake AEDs during EEG study: LEV, Ativan  Technical aspects: This EEG study was done with scalp electrodes positioned according to the 10-20 International system of electrode placement. Electrical activity was reviewed with band pass filter of 1-70Hz , sensitivity of 7 uV/mm, display speed of 4mm/sec  with a 60Hz  notched filter applied as appropriate. EEG data were recorded continuously and digitally stored.  Video monitoring was available and reviewed as appropriate. Description: The posterior dominant rhythm consists of 8-9Hz  activity of moderate voltage (25-35 uV) seen predominantly in posterior head regions, asymmetric( right<left) and reactive to eye opening and eye closing. Drowsiness was characterized by attenuation of the posterior background rhythm. EEG showed continuous 3 to 6 Hz theta-delta slowing in right hemisphere. Hyperventilation and photic stimulation were not performed.   ABNORMALITY - Continuous slow, right hemisphere. IMPRESSION: This study is suggestive of cortical dysfunction arising from right hemisphere likely secondary to underlying structural abnormality, post-ictal state. No seizures or epileptiform discharges were seen throughout the recording. Arlin KIDD Shelton   CT HEAD CODE STROKE WO CONTRAST Result Date: 03/23/2024 EXAM: CT HEAD WITHOUT CONTRAST 03/23/2024 11:18:09 AM TECHNIQUE: CT of the head was performed without the administration of intravenous contrast. Automated exposure control, iterative reconstruction, and/or weight based adjustment of the mA/kV was utilized to reduce the radiation dose to as low as reasonably achievable. COMPARISON: Brain MRI 08/08/2007. Head CT 02/26/2023. CLINICAL HISTORY: 78 year old female with acute neuro deficit, stroke suspected. FINDINGS: BRAIN AND VENTRICLES: Small new round 5 to 6 mm subcortical white matter hypodensity at the right operculum most resembles a small parenchymal hemorrhage (sagittal image 13). There does appear to be nearby cytotoxic edema in the frontal operculum including gray matter involvement on series 9 image 35. No other acute intracranial hemorrhage. No other cytotoxic edema identified. No hydrocephalus. No extra-axial collection. No intracranial mass effect or midline shift. Calcified atherosclerosis at the skull base.  Advanced chronic bilateral cerebral white matter hypodensity, confluent in both cerebral hemispheres and with bilateral deep white matter capsule involvement. Less pronounced chronic heterogeneity in the deep gray nuclei. No convincing asymmetric vessel hyperdensity. No gaze deviation. ORBITS: No acute abnormality. SINUSES: No acute abnormality. SOFT TISSUES AND SKULL: No acute soft tissue abnormality. No skull fracture. [place] stroke program early CT score (aspects) ----- Ganglionic (caudate, ic, lentiform nucleus, insula, M1-m3): 7 Supraganglionic (m4-m6): 2 Total: 9 IMPRESSION: 1. Right opercular Small acute parenchymal hemorrhage (56 mm) with adjacent cytotoxic edema involving the frontal operculum gray matter. ASPECTS 9. 2. No intracranial mass effect or additional acute intracranial hemorrhage. Chronic small vessel disease. 3. These results were communicated to Dr. Lindzen at 1125 hours on 03/23/2024 by text page via the [facility] messaging system. Electronically signed by: Helayne Hurst MD 03/23/2024 11:27 AM EST RP Workstation: [id number]     TODAY-DAY OF DISCHARGE:  Subjective:   Alisa Harold today has no headache,no chest abdominal pain,no new weakness tingling or numbness, feels much better wants to go home today.   Objective:   Blood pressure 106/63, pulse 78, temperature 97.6 F (36.4  C), temperature source Axillary, resp. rate 16, weight 93.4 kg, SpO2 95%.  Intake/Output Summary (Last 24 hours) at 04/13/2024 1052 Last data filed at 04/13/2024 9360 Gross per 24 hour  Intake 398 ml  Output 600 ml  Net -202 ml   Filed Weights   04/10/24 1100  Weight: 93.4 kg    Exam: Awake Alert, Oriented *3, No new F.N deficits, Normal affect [place].AT,PERRAL Supple Neck,No JVD, No cervical lymphadenopathy appriciated.  Symmetrical Chest wall movement, Good air movement bilaterally, CTAB RRR,No Gallops,Rubs or new Murmurs, No Parasternal Heave +ve B.Sounds, Abd Soft, Non tender, No organomegaly  appriciated, No rebound -guarding or rigidity. No Cyanosis, Clubbing or edema, No new Rash or bruise   PERTINENT RADIOLOGIC STUDIES: No results found.   PERTINENT LAB RESULTS: CBC: Recent Labs    04/11/24 0224 04/12/24 0328  WBC 8.5 7.7  HGB 14.6 13.7  HCT 44.6 42.8  PLT 310 271   CMET CMP     Component Value Date/Time   NA 133 (L) 04/13/2024 0456   K 4.0 04/13/2024 0456   CL 102 04/13/2024 0456   CO2 18 (L) 04/13/2024 0456   GLUCOSE 93 04/13/2024 0456   BUN 20 04/13/2024 0456   CREATININE 1.32 (H) 04/13/2024 0456   CALCIUM  8.3 (L) 04/13/2024 0456   PROT 7.8 04/10/2024 0911   ALBUMIN 3.0 (L) 04/10/2024 0911   AST 55 (H) 04/10/2024 0911   ALT 10 04/10/2024 0911   ALKPHOS 53 04/10/2024 0911   BILITOT 1.8 (H) 04/10/2024 0911   GFRNONAA 41 (L) 04/13/2024 0456    GFR Estimated Creatinine Clearance: 37.7 mL/min (A) (by C-G formula based on SCr of 1.32 mg/dL (H)). No results for input(s): LIPASE, AMYLASE in the last 72 hours. No results for input(s): CKTOTAL, CKMB, CKMBINDEX, TROPONINI in the last 72 hours. Invalid input(s): POCBNP No results for input(s): DDIMER in the last 72 hours. No results for input(s): HGBA1C in the last 72 hours. No results for input(s): CHOL, HDL, LDLCALC, TRIG, CHOLHDL, LDLDIRECT in the last 72 hours. No results for input(s): TSH, T4TOTAL, T3FREE, THYROIDAB in the last 72 hours.  Invalid input(s): FREET3 No results for input(s): VITAMINB12, FOLATE, FERRITIN, TIBC, IRON, RETICCTPCT in the last 72 hours. Coags: No results for input(s): INR in the last 72 hours.  Invalid input(s): PT Microbiology: Recent Results (from the past 240 hours)  Culture, blood (Routine X 2) w Reflex to ID Panel     Status: None   Collection Time: 04/03/24  7:03 PM   Specimen: BLOOD  Result Value Ref Range Status   Specimen Description BLOOD SITE NOT SPECIFIED  Final   Special Requests   Final    BOTTLES  DRAWN AEROBIC ONLY Blood Culture results may not be optimal due to an inadequate volume of blood received in culture bottles   Culture   Final    NO GROWTH 6 DAYS Performed at [facility], [address] [place], [place] [address]    Report Status 04/09/2024 FINAL  Final  Culture, blood (Routine X 2) w Reflex to ID Panel     Status: None (Preliminary result)   Collection Time: 04/10/24 10:12 AM   Specimen: BLOOD RIGHT HAND  Result Value Ref Range Status   Specimen Description BLOOD RIGHT HAND  Final   Special Requests   Final    BOTTLES DRAWN AEROBIC ONLY Blood Culture results may not be optimal due to an inadequate volume of blood received in culture bottles   Culture  Final    NO GROWTH 2 DAYS Performed at [facility], [address], [place], [place] [address]    Report Status PENDING  Incomplete  Culture, blood (Routine X 2) w Reflex to ID Panel     Status: Abnormal   Collection Time: 04/10/24 10:14 AM   Specimen: BLOOD LEFT HAND  Result Value Ref Range Status   Specimen Description BLOOD LEFT HAND  Final   Special Requests   Final    BOTTLES DRAWN AEROBIC ONLY Blood Culture results may not be optimal due to an inadequate volume of blood received in culture bottles   Culture  Setup Time   Final    GRAM POSITIVE COCCI AEROBIC BOTTLE ONLY RBV VBRYK MIRANDA PHARM D 04/11/2024 @ 0542 BY DD    Culture (A)  Final    STAPHYLOCOCCUS WARNERI Standardized susceptibility testing for this organism is not available. Performed at [facility], [address], [place], [place] [address]    Report Status 04/13/2024 FINAL  Final  Blood Culture ID Panel (Reflexed)     Status: Abnormal   Collection Time: 04/10/24 10:14 AM  Result Value Ref Range Status   Enterococcus faecalis NOT DETECTED NOT DETECTED Final   Enterococcus Faecium NOT DETECTED NOT DETECTED Final   Listeria monocytogenes NOT DETECTED NOT DETECTED Final   Staphylococcus species DETECTED (A) NOT DETECTED  Final    Comment: CRITICAL RESULT CALLED TO, READ BACK BY AND VERIFIED WITH: RBV VBRYK MIRANDA PHARM D 04/11/2024 @ 0539 BY DD    Staphylococcus aureus (BCID) NOT DETECTED NOT DETECTED Final   Staphylococcus epidermidis NOT DETECTED NOT DETECTED Final   Staphylococcus lugdunensis NOT DETECTED NOT DETECTED Final   Streptococcus species NOT DETECTED NOT DETECTED Final   Streptococcus agalactiae NOT DETECTED NOT DETECTED Final   Streptococcus pneumoniae NOT DETECTED NOT DETECTED Final   Streptococcus pyogenes NOT DETECTED NOT DETECTED Final   A.calcoaceticus-baumannii NOT DETECTED NOT DETECTED Final   Bacteroides fragilis NOT DETECTED NOT DETECTED Final   Enterobacterales NOT DETECTED NOT DETECTED Final   Enterobacter cloacae complex NOT DETECTED NOT DETECTED Final   Escherichia coli NOT DETECTED NOT DETECTED Final   Klebsiella aerogenes NOT DETECTED NOT DETECTED Final   Klebsiella oxytoca NOT DETECTED NOT DETECTED Final   Klebsiella pneumoniae NOT DETECTED NOT DETECTED Final   Proteus species NOT DETECTED NOT DETECTED Final   Salmonella species NOT DETECTED NOT DETECTED Final   Serratia marcescens NOT DETECTED NOT DETECTED Final   Haemophilus influenzae NOT DETECTED NOT DETECTED Final   Neisseria meningitidis NOT DETECTED NOT DETECTED Final   Pseudomonas aeruginosa NOT DETECTED NOT DETECTED Final   Stenotrophomonas maltophilia NOT DETECTED NOT DETECTED Final   Candida albicans NOT DETECTED NOT DETECTED Final   Candida auris NOT DETECTED NOT DETECTED Final   Candida glabrata NOT DETECTED NOT DETECTED Final   Candida krusei NOT DETECTED NOT DETECTED Final   Candida parapsilosis NOT DETECTED NOT DETECTED Final   Candida tropicalis NOT DETECTED NOT DETECTED Final   Cryptococcus neoformans/gattii NOT DETECTED NOT DETECTED Final    Comment: Performed at [facility], [address], [place], [place] [address]  MRSA Next Gen by PCR, Nasal     Status: None   Collection Time: 04/10/24   9:45 PM   Specimen: Nasal Mucosa; Nasal Swab  Result Value Ref Range Status   MRSA by PCR Next Gen NOT DETECTED NOT DETECTED Final    Comment: (NOTE) The GeneXpert MRSA Assay (FDA approved  for NASAL specimens only), is one component of a comprehensive MRSA colonization surveillance program. It is not intended to diagnose MRSA infection nor to guide or monitor treatment for MRSA infections. Test performance is not FDA approved in patients less than 39 years old. Performed at [facility], [address], [place], [place] [address]     FURTHER DISCHARGE INSTRUCTIONS:  Get Medicines reviewed and adjusted: Please take all your medications with you for your next visit with your Primary MD  Laboratory/radiological data: Please request your Primary MD to go over all hospital tests and procedure/radiological results at the follow up, please ask your Primary MD to get all Hospital records sent to his/her office.  In some cases, they will be blood work, cultures and biopsy results pending at the time of your discharge. Please request that your primary care M.D. goes through all the records of your hospital data and follows up on these results.  Also Note the following: If you experience worsening of your admission symptoms, develop shortness of breath, life threatening emergency, suicidal or homicidal thoughts you must seek medical attention immediately by calling 911 or calling your MD immediately  if symptoms less severe.  You must read complete instructions/literature along with all the possible adverse reactions/side effects for all the Medicines you take and that have been prescribed to you. Take any new Medicines after you have completely understood and accpet all the possible adverse reactions/side effects.   Do not drive when taking Pain medications or sleeping medications (Benzodaizepines)  Do not take more than prescribed Pain, Sleep and Anxiety Medications. It is not advisable  to combine anxiety,sleep and pain medications without talking with your primary care practitioner  Special Instructions: If you have smoked or chewed Tobacco  in the last 2 yrs please stop smoking, stop any regular Alcohol  and or any Recreational drug use.  Wear Seat belts while driving.  Please note: You were cared for by a hospitalist during your hospital stay. Once you are discharged, your primary care physician will handle any further medical issues. Please note that NO REFILLS for any discharge medications will be authorized once you are discharged, as it is imperative that you return to your primary care physician (or establish a relationship with a primary care physician if you do not have one) for your post hospital discharge needs so that they can reassess your need for medications and monitor your lab values.  Total Time spent coordinating discharge including counseling, education and face to face time equals greater than 30 minutes.  SignedBETHA Donalda Applebaum 04/13/2024 10:52 AM

## 2024-04-13 NOTE — TOC Progression Note (Addendum)
 Transition of Care [facility]) - Progression Note    Patient Details  Patton: [name] MRN: [id number] Date of Birth: [birth year]  [facility] CM/SW Contact  Inocente GORMAN Kindle, LCSW Phone Number: 04/13/2024, 10:09 AM  Clinical Narrative:    10am-Per MD, hopeful to discharge tomorrow. CSW updated [facility]. Weekend CSW to fax DC Summary to [facility] at [phone].  11:19 AM-Per MD, patient now able to discharge today. CSW received call from son, [name]. He stated they would like to plan for patient to go to [facility] instead of [facility].  CSW spoke with [facility] who confirmed they can accept patient today.   CSW spoke with patient's sons and they confirmed they would like to proceed with [facility]. CSW updated [facility] and sent referral to [facility] for outpatient palliative care. Patient will require PTAR for transport.   Expected Discharge Plan: Skilled Nursing Facility Barriers to Discharge: Continued Medical Work up               Expected Discharge Plan and Services In-house Referral: Clinical Social Work   Post Acute Care Choice: Skilled Nursing Facility Living arrangements for the past 2 months: Skilled Nursing Facility, Apartment                                       Social Drivers of Health (SDOH) Interventions SDOH Screenings   Food Insecurity: No Food Insecurity (04/05/2024)  Housing: Low Risk  (04/05/2024)  Transportation Needs: No Transportation Needs (04/05/2024)  Utilities: Not At Risk (04/05/2024)  Social Connections: Moderately Integrated (04/05/2024)  Tobacco Use: Low Risk  (08/11/2023)    Readmission Risk Interventions     No data to display

## 2024-04-13 NOTE — Progress Notes (Signed)
   Palliative Medicine Inpatient Follow Up Note HPI: 78 y.o. 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 Neurosurgery reports that tumor is not appropriate for surgical resection and recommend repeat MRI in 6-8 weeks and follow up with neuro-oncology. Admitted on 04/02/2024 with altered mental status with pneumonia.   Today's Discussion 04/13/2024  *Please note that this is a verbal dictation therefore any spelling or grammatical errors are due to the Dragon Medical One system interpretation.  I reviewed the chart notes including nursing notes from today, progress notes from Dr. Raenelle today, and MSW notes from [facility]. I also reviewed vital signs, nursing flowsheets, medication administrations record, labs BMP -Na 133, K 4.0, Chl 102, CO2 18, Glu 93, BUN 20, Cr 1.32, Ca 8.3, Anion Gap 13, Mg 2.1, GFR 41 , and imaging.    I met with Alisa at bedside this morning. She is arousable and aware of self. She is not aware of where she nor why but is good spirits. She denies pain, shortness of breath, or nausea.   Patients sons have determined to continue present care.  Alleen will transition to [facility] today for ongoing rehabilitation.  Questions and concerns addressed/Palliative Support Provided.   Objective Assessment: Vital Signs Vitals:   04/13/24 0400 04/13/24 0800  BP: 107/72 106/63  Pulse: 88 78  Resp: 18 16  Temp:  97.6 F (36.4 C)  SpO2: 95% 95%    Intake/Output Summary (Last 24 hours) at 04/13/2024 1108 Last data filed at 04/13/2024 9360 Gross per 24 hour  Intake 398 ml  Output 600 ml  Net -202 ml   Last Weight  Most recent update: 04/10/2024 11:54 AM    Weight   93.4 kg (205 lb 14.6 oz)            Gen:  Elderly Chronically ill appearing F  HEENT: moist mucous membranes CV: Regular rate and rhythm  PULM: On RA, breathing is even and nonlabored ABD: soft/nontender  EXT: No edema  Neuro: Opens eyes and is able to state name  SUMMARY OF RECOMMENDATIONS   DNAR/DNI  Continue to allow time for outcomes  Plan to transition to [facility] today ______________________________________________________________________________________ Rosaline Becton [place] Palliative Medicine Team Team Cell Phone: [phone] Please utilize secure chat with additional questions, if there is no response within 30 minutes please call the above phone number  MDM: Moderate   Palliative Medicine Team providers are available by phone from 7am to 7pm daily and can be reached through the team cell phone.  Should this patient require assistance outside of these hours, please call the patient's attending physician.

## 2024-04-13 NOTE — Plan of Care (Signed)

## 2024-04-13 NOTE — Progress Notes (Signed)
 [facility] [phone] [facility] Liaison Note  Notified by Inocente Kindle, LCSW of patient/family request for Palliative Services at [facility] after discharge.  Please call for any questions or concerns.  Thank you for the opportunity to participate in this patients care.  Randine Nail, BSN, [employer] [phone]

## 2024-04-13 NOTE — Progress Notes (Signed)
 Modified Barium Swallow Study  Patient Details  Name: [name] MRN: [id number] Date of Birth: 1945-11-14  Today's Date: 04/13/2024  Modified Barium Swallow completed.  Full report located under Chart Review in the Imaging Section.  History of Present Illness [name] is an 78 yo female who presented to ED from SNF with somnolence 11/17. Dx acute metabolic encephalopathy, sepsis secondary to HAP. Recent admission 11/ 7 -11/ 11 for seizures, cognitive changes, dysphagia and dysarthria. Dx R frontal infiltrative high-grade lesion, suspicious for tumor - nsgy recommended repeat MRI x six weeks. MBS 03/26/24 moderate pharyngeal  dysphagia with penetration thin liquids to the vocal folds; left oral residue.  Dysphagia 3/thin liquids recommended. Pt less responsive 11/25, RR called and CXR ordered, revealing mild L basilar atelectasis and possible minimal pneumonia or aspiration pneumonitis. PMHx HTN, osteoarthritis, and memory loss weakness.   Clinical Impression This study was limited by pt's alertness, inability to follow commands, and positioning but showed no penetration/aspiration across two swallows of thin liquids and puree. She held both boluses orally before letting them progress posteriorly to pool in the pyriform sinuses. Given extensive cueing, she initiated a swallow with adequate laryngeal closure and no significant pharyngeal residue. Could not be challenged with larger boluses but suspect intermittent aspiration is possible given cognitive deficits and alertness. When more alert, oral transit is functional so would recommend she continue current diet with full supervision. Hold POs if she is orally holding or not alert. Ongoing SLP f/u is recommended at her next venue of care for ongoing education regarding cognitive deficits and subsequent risk of inadequate hydration/nutrition. Factors that may increase risk of adverse event in presence of aspiration Noe & Lianne 2021):  Reduced cognitive function;Limited mobility;Frail or deconditioned;Dependence for feeding and/or oral hygiene  Swallow Evaluation Recommendations Recommendations: PO diet PO Diet Recommendation: Dysphagia 3 (Mechanical soft);Thin liquids (Level 0) Liquid Administration via: Cup;Straw Medication Administration: Whole meds with puree Supervision: Full assist for feeding;Full supervision/cueing for swallowing strategies Swallowing strategies  : Minimize environmental distractions;Slow rate;Small bites/sips Postural changes: Position pt fully upright for meals Oral care recommendations: Oral care BID (2x/day);Oral care before PO;Staff/trained caregiver to provide oral care Recommended consults: Consider Palliative care    Damien Blumenthal, M.A., CCC-SLP Speech Language Pathology, Acute Rehabilitation Services  Secure Chat preferred [phone]  04/13/2024,12:12 PM

## 2024-04-13 NOTE — Progress Notes (Signed)
 PROGRESS NOTE        PATIENT DETAILS Name: [name] Age: 78 y.o. Sex: female Date of Birth: 11/26/1945 Admit Date: 04/02/2024 Admitting Physician Terry LOISE Hurst, DO ERE:[id number], Zachary, MD  Brief Summary: Patient is a 78 y.o.  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  She was brought to the ED on 11/17-for altered mental status-was found to have acute metabolic encephalopathy in the setting of PNA.  She was admitted and started on broad-spectrum IV antibiotics-she clinically improved-but on 11/25 she had another episode of unresponsiveness/encephalopathy-she was febrile-unfortunately-appears that she had another episode of aspiration pneumonia-she was restarted on antibiotics with clinical improvement.  Significant events: 11/17>> admit to [facility]. 11/25>> barely responsive-urgent neurology consultation-stat CT head/ABG ordered.  Placed on LTM EEG by neurology. 11/26>>  Significant studies: 11/17>> CT head: Persistent hypodensity/mild edema right frontoparietal operculum 11/17>> CXR: Left> right airspace disease-atelectasis versus infection versus aspiration. 11/18>> EEG: No seizures. 11/25>> CT head: No acute intracranial abnormality.  Decreased edema in the region of the right frontoparietal operculum/thalamus. 11/25-11/26>> LTM EEG: No seizures. 11/26>> chest x-ray: Left sided pneumonia  Significant microbiology data: 11/17>> COVID/influenza/RSV PCR: Negative 11/17>> blood culture: No growth 11/18>> blood culture: No growth 11/25>> blood culture 1/2-gram-positive cocci-probably  contaminant.  Procedures: None  Consults: Palliative Neuro Oncology Neurology  Subjective: Unchanged-no major issues overnight-easily arouses-answers some questions appropriately-but overall still very pleasantly confused.  Objective: Vitals: Blood pressure 106/63, pulse 78, temperature 97.6 F (36.4 C), temperature source Axillary, resp. rate 16, weight 93.4 kg, SpO2 95%.   Exam: Awake-pleasantly confused Answer some questions appropriately-speech slow but clear Moving all 4 extremities but continues to have mild left-sided hemiparesis.    Pertinent Labs/Radiology:    Latest Ref Rng & Units 04/12/2024    3:28 AM 04/11/2024    2:24 AM 04/10/2024    9:11 AM  CBC  WBC 4.0 - 10.5 K/uL 7.7  8.5  9.4   Hemoglobin 12.0 - 15.0 g/dL 86.2  85.3  83.6   Hematocrit 36.0 - 46.0 % 42.8  44.6  51.0   Platelets 150 - 400 K/uL 271  310  320     Lab Results  Component Value Date   NA 133 (L) 04/13/2024   K 4.0 04/13/2024   CL 102 04/13/2024   CO2 18 (L) 04/13/2024      Assessment/Plan: Acute metabolic encephalopathy superimposed on dementia/delirium Secondary to sepsis physiology/aspiration pneumonia-had improved after treatment of underlying etiologies but markedly deteriorated on 11/25-with unresponsiveness.  Suspect etiology for encephalopathy on 11/25 was probable aspiration pneumonia, no evidence of seizures on LTM EEG.  She was started on empiric antibiotics and has remarkably improved Remains on AEDs. Supportive care.  Sepsis secondary to presumed aspiration pneumonia (likely related to recent dysphagia in the setting of CVA/right frontal lobe lesion) Sepsis already had resolved-patient had completed a course of antibiotics on 11/22 but redeveloped sepsis physiology/encephalopathy with fever on 11/25-started on Zosyn -clinically improved-Will switch to Augmentin  today.    Gram-positive bacteremia (probably coag negative species) Likely a contamination-await final  speciation.  Oropharyngeal dysphagia Likely related to recent CVA versus right frontal lobe tumor-and now likely  due to encephalopathy Clinically improved-SLP following-has been n.p.o. related to dysphagia 3 diet-watch closely.  AKI Mild Likely hemodynamically mediated Follow electrolytes periodically Avoid nephrotoxic agents.  Hyponatremia Mild Likely of no clinical significance Follow electrolytes periodically  Hypokalemia Replete/recheck.  Seizure Likely related to underlying right frontal lobe lesion Continue Keppra  Initially-encephalopathy was felt to be related to aspiration pneumonia-however due to significant deterioration in 11/25-LTM EEG was started-no seizures. 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    HTN BP stable-all antihypertensives currently on hold-resume when able.  HLD resume statin.   Polycythemia Mild Unclear etiology Stable for periodic follow-up.  GERD PPI  Dementia/cognitive dysfunction Resume Aricept   Palliative care Has had some waxing/waning delirium-but became markedly worse on 11/25 with severe encephalopathy-essentially obtunded.  Suspect decompensation on 11/25 was likely due to sepsis related to aspiration pneumonia-no evidence of seizures.  Although has a low-grade glioma-suspect she remains at risk for aspiration issues/breakthrough seizures-which is probably more secondary to her underlying dementia/frailty and advanced age.  Long discussion with both sons  by this MD on 11/25-family discussion in progress-they understand that patient will likely continue to have issues with breakthrough seizures/encephalopathy/aspiration pneumonia in the future.  Thankfully with antibiotics-patient's encephalopathy has improved-we will continue supportive care-continue to engage family.  Will need close outpatient follow-up with palliative care at SNF.  Class 2 Obesity: Estimated body mass index is 35.34 kg/m as calculated from the following:   Height as of 02/26/24: 5' 4 (1.626 m).   Weight as of this encounter: 93.4 kg.    Code status:   Code Status: Limited: Do not attempt resuscitation (DNR) -DNR-LIMITED -Do Not Intubate/DNI    DVT Prophylaxis: enoxaparin  (LOVENOX ) injection 40 mg Start: 04/02/24 2000   Family Communication: Son-[name] [phone] updated 11/27.   Disposition Plan: Status is: Inpatient Remains inpatient appropriate because: Severity of illness   Planned Discharge Destination:Skilled nursing facility likely on 11/25-has MBS planned for later this afternoon.  Diet: Diet Order             DIET DYS 3 Room service appropriate? No; Fluid consistency: Thin  Diet effective now                     Antimicrobial agents: Anti-infectives (From admission, onward)    Start     Dose/Rate Route Frequency Ordered Stop   04/11/24 1045  vancomycin  (VANCOREADY) IVPB 1750 mg/350 mL  Status:  Discontinued        1,750 mg 175 mL/hr over 120 Minutes Intravenous Every 24 hours 04/10/24 0955 04/11/24 1044   04/10/24 2200  piperacillin -tazobactam (ZOSYN ) IVPB 3.375 g        3.375 g 12.5 mL/hr over 240 Minutes Intravenous Every 8 hours 04/10/24 1418     04/10/24 1045  piperacillin -tazobactam (ZOSYN ) IVPB 3.375 g  Status:  Discontinued        3.375 g 12.5 mL/hr over 240 Minutes Intravenous Every 8 hours 04/10/24 0949 04/10/24 1418   04/10/24 1045  vancomycin  (VANCOREADY) IVPB 2000 mg/400 mL        2,000 mg 200 mL/hr over 120 Minutes  Intravenous  Once 04/10/24 0949 04/11/24 0956   04/07/24 1200  amoxicillin -clavulanate (AUGMENTIN ) 400-57 MG/5ML suspension  800 mg        800 mg Oral Every 12 hours 04/07/24 1108 04/07/24 2135   04/04/24 0945  Ampicillin -Sulbactam (UNASYN ) 3 g in sodium chloride  0.9 % 100 mL IVPB  Status:  Discontinued        3 g 200 mL/hr over 30 Minutes Intravenous Every 6 hours 04/04/24 0846 04/07/24 1107   04/03/24 0800  ceFEPIme  (MAXIPIME ) 2 g in sodium chloride  0.9 % 100 mL IVPB  Status:  Discontinued        2 g 200 mL/hr over 30 Minutes Intravenous Every 12 hours 04/02/24 2101 04/04/24 0820   04/03/24 0800  metroNIDAZOLE  (FLAGYL ) IVPB 500 mg  Status:  Discontinued        500 mg 100 mL/hr over 60 Minutes Intravenous 2 times daily 04/03/24 0747 04/04/24 0820   04/02/24 1930  vancomycin  (VANCOCIN ) IVPB 1000 mg/200 mL premix  Status:  Discontinued        1,000 mg 200 mL/hr over 60 Minutes Intravenous  Once 04/02/24 1921 04/02/24 1922   04/02/24 1930  ceFEPIme  (MAXIPIME ) 2 g in sodium chloride  0.9 % 100 mL IVPB        2 g 200 mL/hr over 30 Minutes Intravenous  Once 04/02/24 1921 04/02/24 2006   04/02/24 1930  vancomycin  (VANCOREADY) IVPB 2000 mg/400 mL        2,000 mg 200 mL/hr over 120 Minutes Intravenous  Once 04/02/24 1922 04/02/24 2228        MEDICATIONS: Scheduled Meds:  enoxaparin  (LOVENOX ) injection  40 mg Subcutaneous Q24H   levETIRAcetam   500 mg Intravenous Q12H   pantoprazole  (PROTONIX ) IV  40 mg Intravenous Q24H   prednisoLONE  acetate  1 drop Right Eye QID   Continuous Infusions:  piperacillin -tazobactam (ZOSYN )  IV 3.375 g (04/13/24 0515)    PRN Meds:.acetaminophen , acetaminophen , hydrALAZINE , LORazepam , melatonin, polyethylene glycol, prochlorperazine    I have personally reviewed following labs and imaging studies  LABORATORY DATA: CBC: Recent Labs  Lab 04/10/24 0911 04/11/24 0224 04/12/24 0328  WBC 9.4 8.5 7.7  HGB 16.3* 14.6 13.7  HCT 51.0* 44.6 42.8  MCV 87.8 86.4  88.4  PLT 320 310 271    Basic Metabolic Panel: Recent Labs  Lab 04/07/24 0409 04/09/24 0602 04/10/24 0911 04/11/24 0224 04/12/24 0533 04/13/24 0456  NA 135  --  134* 132* 134* 133*  K 3.5  --  5.0 3.6 3.3* 4.0  CL 102  --  100 101 102 102  CO2 19*  --  15* 18* 20* 18*  GLUCOSE 85  --  88 92 92 93  BUN 18  --  18 18 21 20   CREATININE 1.12* 1.14* 1.36* 1.28* 1.34* 1.32*  CALCIUM  8.3*  --  9.0 8.7* 8.6* 8.3*  MG 1.9  --   --   --   --  2.1  PHOS 3.5  --   --   --   --   --     GFR: Estimated Creatinine Clearance: 37.7 mL/min (A) (by C-G formula based on SCr of 1.32 mg/dL (H)).  Liver Function Tests: Recent Labs  Lab 04/10/24 0911  AST 55*  ALT 10  ALKPHOS 53  BILITOT 1.8*  PROT 7.8  ALBUMIN 3.0*   No results for input(s): LIPASE, AMYLASE in the last 168 hours. Recent Labs  Lab 04/10/24 1132  AMMONIA 26    Coagulation Profile: No results for input(s): INR, PROTIME in the last 168 hours.   Cardiac Enzymes: No results for input(s): CKTOTAL,  CKMB, CKMBINDEX, TROPONINI in the last 168 hours.  BNP (last 3 results) No results for input(s): PROBNP in the last 8760 hours.  Lipid Profile: No results for input(s): CHOL, HDL, LDLCALC, TRIG, CHOLHDL, LDLDIRECT in the last 72 hours.  Thyroid Function Tests: No results for input(s): TSH, T4TOTAL, FREET4, T3FREE, THYROIDAB in the last 72 hours.  Anemia Panel: No results for input(s): VITAMINB12, FOLATE, FERRITIN, TIBC, IRON, RETICCTPCT in the last 72 hours.  Urine analysis:    Component Value Date/Time   COLORURINE YELLOW 04/02/2024 2030   APPEARANCEUR CLEAR 04/02/2024 2030   LABSPEC 1.018 04/02/2024 2030   PHURINE 5.0 04/02/2024 2030   GLUCOSEU NEGATIVE 04/02/2024 2030   HGBUR SMALL (A) 04/02/2024 2030   BILIRUBINUR NEGATIVE 04/02/2024 2030   BILIRUBINUR negative 01/02/2021 1225   BILIRUBINUR negative 06/03/2015 1024   KETONESUR 20 (A) 04/02/2024 2030    PROTEINUR 100 (A) 04/02/2024 2030   UROBILINOGEN 2.0 (A) 01/02/2021 1225   NITRITE NEGATIVE 04/02/2024 2030   LEUKOCYTESUR NEGATIVE 04/02/2024 2030    Sepsis Labs: Lactic Acid, Venous    Component Value Date/Time   LATICACIDVEN 1.2 04/02/2024 2255    MICROBIOLOGY: Recent Results (from the past 240 hours)  Culture, blood (Routine X 2) w Reflex to ID Panel     Status: None   Collection Time: 04/03/24  7:03 PM   Specimen: BLOOD  Result Value Ref Range Status   Specimen Description BLOOD SITE NOT SPECIFIED  Final   Special Requests   Final    BOTTLES DRAWN AEROBIC ONLY Blood Culture results may not be optimal due to an inadequate volume of blood received in culture bottles   Culture   Final    NO GROWTH 6 DAYS Performed at [facility], [address] [place], [place] [address]    Report Status 04/09/2024 FINAL  Final  Culture, blood (Routine X 2) w Reflex to ID Panel     Status: None (Preliminary result)   Collection Time: 04/10/24 10:12 AM   Specimen: BLOOD RIGHT HAND  Result Value Ref Range Status   Specimen Description BLOOD RIGHT HAND  Final   Special Requests   Final    BOTTLES DRAWN AEROBIC ONLY Blood Culture results may not be optimal due to an inadequate volume of blood received in culture bottles   Culture   Final    NO GROWTH 2 DAYS Performed at [facility], [address] [place], [place] [address]    Report Status PENDING  Incomplete  Culture, blood (Routine X 2) w Reflex to ID Panel     Status: None (Preliminary result)   Collection Time: 04/10/24 10:14 AM   Specimen: BLOOD LEFT HAND  Result Value Ref Range Status   Specimen Description BLOOD LEFT HAND  Final   Special Requests   Final    BOTTLES DRAWN AEROBIC ONLY Blood Culture results may not be optimal due to an inadequate volume of blood received in culture bottles   Culture  Setup Time   Final    GRAM POSITIVE COCCI AEROBIC BOTTLE ONLY RBV VBRYK MIRANDA PHARM D 04/11/2024 @ 0542 BY DD     Culture   Final    GRAM POSITIVE COCCI IDENTIFICATION TO FOLLOW Performed at [facility], [address], [place], [place] [address]    Report Status PENDING  Incomplete  Blood Culture ID Panel (Reflexed)     Status: Abnormal   Collection Time: 04/10/24 10:14 AM  Result Value Ref Range  Status   Enterococcus faecalis NOT DETECTED NOT DETECTED Final   Enterococcus Faecium NOT DETECTED NOT DETECTED Final   Listeria monocytogenes NOT DETECTED NOT DETECTED Final   Staphylococcus species DETECTED (A) NOT DETECTED Final    Comment: CRITICAL RESULT CALLED TO, READ BACK BY AND VERIFIED WITH: RBV VBRYK MIRANDA PHARM D 04/11/2024 @ 0539 BY DD    Staphylococcus aureus (BCID) NOT DETECTED NOT DETECTED Final   Staphylococcus epidermidis NOT DETECTED NOT DETECTED Final   Staphylococcus lugdunensis NOT DETECTED NOT DETECTED Final   Streptococcus species NOT DETECTED NOT DETECTED Final   Streptococcus agalactiae NOT DETECTED NOT DETECTED Final   Streptococcus pneumoniae NOT DETECTED NOT DETECTED Final   Streptococcus pyogenes NOT DETECTED NOT DETECTED Final   A.calcoaceticus-baumannii NOT DETECTED NOT DETECTED Final   Bacteroides fragilis NOT DETECTED NOT DETECTED Final   Enterobacterales NOT DETECTED NOT DETECTED Final   Enterobacter cloacae complex NOT DETECTED NOT DETECTED Final   Escherichia coli NOT DETECTED NOT DETECTED Final   Klebsiella aerogenes NOT DETECTED NOT DETECTED Final   Klebsiella oxytoca NOT DETECTED NOT DETECTED Final   Klebsiella pneumoniae NOT DETECTED NOT DETECTED Final   Proteus species NOT DETECTED NOT DETECTED Final   Salmonella species NOT DETECTED NOT DETECTED Final   Serratia marcescens NOT DETECTED NOT DETECTED Final   Haemophilus influenzae NOT DETECTED NOT DETECTED Final   Neisseria meningitidis NOT DETECTED NOT DETECTED Final   Pseudomonas aeruginosa NOT DETECTED NOT DETECTED Final   Stenotrophomonas maltophilia NOT DETECTED NOT DETECTED Final   Candida  albicans NOT DETECTED NOT DETECTED Final   Candida auris NOT DETECTED NOT DETECTED Final   Candida glabrata NOT DETECTED NOT DETECTED Final   Candida krusei NOT DETECTED NOT DETECTED Final   Candida parapsilosis NOT DETECTED NOT DETECTED Final   Candida tropicalis NOT DETECTED NOT DETECTED Final   Cryptococcus neoformans/gattii NOT DETECTED NOT DETECTED Final    Comment: Performed at [facility], [address] [place], [place] [address]  MRSA Next Gen by PCR, Nasal     Status: None   Collection Time: 04/10/24  9:45 PM   Specimen: Nasal Mucosa; Nasal Swab  Result Value Ref Range Status   MRSA by PCR Next Gen NOT DETECTED NOT DETECTED Final    Comment: (NOTE) The GeneXpert MRSA Assay (FDA approved for NASAL specimens only), is one component of a comprehensive MRSA colonization surveillance program. It is not intended to diagnose MRSA infection nor to guide or monitor treatment for MRSA infections. Test performance is not FDA approved in patients less than 52 years old. Performed at [facility], [address] [place], [place] [address]     RADIOLOGY STUDIES/RESULTS: No results found.     LOS: 11 days   Donalda Applebaum, MD  [facility]    To contact the attending provider between 7A-7P or the covering provider during after hours 7P-7A, please log into the web site [email or web address] and access using universal [place] password for that web site. If you do not have the password, please call the hospital operator.  04/13/2024, 9:40 AM

## 2024-04-13 NOTE — Progress Notes (Signed)
 AVS in packet for transport.

## 2024-04-15 LAB — CULTURE, BLOOD (ROUTINE X 2): Culture: NO GROWTH

## 2024-04-16 ENCOUNTER — Telehealth: Payer: Self-pay | Admitting: Diagnostic Neuroimaging

## 2024-04-16 ENCOUNTER — Ambulatory Visit: Admitting: Physician Assistant

## 2024-04-16 NOTE — Telephone Encounter (Signed)
 [facility] called to schedule a hospital follow up appointment. Transferred call to New Patient Referral.

## 2024-04-25 ENCOUNTER — Ambulatory Visit (HOSPITAL_COMMUNITY): Admission: RE | Admit: 2024-04-25

## 2024-04-30 ENCOUNTER — Inpatient Hospital Stay

## 2024-04-30 ENCOUNTER — Inpatient Hospital Stay: Admitting: Internal Medicine

## 2024-04-30 ENCOUNTER — Telehealth: Payer: Self-pay

## 2024-04-30 NOTE — Telephone Encounter (Signed)
 Reached out to patient's son, Celina, to inform him that patient had not yet scheduled MRI scan.  Son informed RN on call that patient had passed away on 05/27/24. He requested that all future appointments be canceled. Expressed condolences and informed son that appointments would be updated. Dr. Buckley aware.

## 2024-05-17 DEATH — deceased
